# Patient Record
Sex: Male | Born: 1975
Health system: Southern US, Community
[De-identification: ages and names within clinical notes are randomized; demographics above are authoritative.]

## PROBLEM LIST (undated history)

## (undated) DIAGNOSIS — J45909 Unspecified asthma, uncomplicated: Secondary | ICD-10-CM

## (undated) DIAGNOSIS — B009 Herpesviral infection, unspecified: Secondary | ICD-10-CM

## (undated) DIAGNOSIS — T7840XA Allergy, unspecified, initial encounter: Secondary | ICD-10-CM

## (undated) DIAGNOSIS — K219 Gastro-esophageal reflux disease without esophagitis: Secondary | ICD-10-CM

## (undated) DIAGNOSIS — J40 Bronchitis, not specified as acute or chronic: Secondary | ICD-10-CM

## (undated) DIAGNOSIS — M1990 Unspecified osteoarthritis, unspecified site: Secondary | ICD-10-CM

## (undated) DIAGNOSIS — I10 Essential (primary) hypertension: Secondary | ICD-10-CM

## (undated) DIAGNOSIS — F418 Other specified anxiety disorders: Secondary | ICD-10-CM

## (undated) DIAGNOSIS — G709 Myoneural disorder, unspecified: Secondary | ICD-10-CM

## (undated) DIAGNOSIS — F419 Anxiety disorder, unspecified: Secondary | ICD-10-CM

## (undated) DIAGNOSIS — Z8739 Personal history of other diseases of the musculoskeletal system and connective tissue: Secondary | ICD-10-CM

## (undated) HISTORY — DX: Herpesviral infection, unspecified: B00.9

## (undated) HISTORY — PX: NASAL SEPTUM SURGERY: SHX37

## (undated) HISTORY — DX: Anxiety disorder, unspecified: F41.9

## (undated) HISTORY — PX: TONSILLECTOMY: SUR1361

## (undated) HISTORY — DX: Bronchitis, not specified as acute or chronic: J40

## (undated) HISTORY — PX: CORNEAL TRANSPLANT: SHX108

## (undated) HISTORY — DX: Unspecified osteoarthritis, unspecified site: M19.90

## (undated) HISTORY — DX: Other specified anxiety disorders: F41.8

## (undated) HISTORY — DX: Myoneural disorder, unspecified: G70.9

## (undated) HISTORY — DX: Allergy, unspecified, initial encounter: T78.40XA

## (undated) HISTORY — PX: OTHER SURGICAL HISTORY: SHX169

---

## 2002-01-30 ENCOUNTER — Emergency Department (HOSPITAL_COMMUNITY): Admission: EM | Admit: 2002-01-30 | Discharge: 2002-01-31 | Payer: Self-pay | Admitting: Emergency Medicine

## 2002-01-30 ENCOUNTER — Encounter: Payer: Self-pay | Admitting: Emergency Medicine

## 2002-12-11 ENCOUNTER — Emergency Department (HOSPITAL_COMMUNITY): Admission: EM | Admit: 2002-12-11 | Discharge: 2002-12-11 | Payer: Self-pay | Admitting: Emergency Medicine

## 2002-12-14 ENCOUNTER — Encounter: Payer: Self-pay | Admitting: Emergency Medicine

## 2002-12-14 ENCOUNTER — Emergency Department (HOSPITAL_COMMUNITY): Admission: EM | Admit: 2002-12-14 | Discharge: 2002-12-14 | Payer: Self-pay | Admitting: Emergency Medicine

## 2003-01-04 ENCOUNTER — Emergency Department (HOSPITAL_COMMUNITY): Admission: EM | Admit: 2003-01-04 | Discharge: 2003-01-04 | Payer: Self-pay | Admitting: Emergency Medicine

## 2003-07-04 ENCOUNTER — Emergency Department (HOSPITAL_COMMUNITY): Admission: EM | Admit: 2003-07-04 | Discharge: 2003-07-04 | Payer: Self-pay | Admitting: Emergency Medicine

## 2003-07-10 ENCOUNTER — Emergency Department (HOSPITAL_COMMUNITY): Admission: EM | Admit: 2003-07-10 | Discharge: 2003-07-10 | Payer: Self-pay | Admitting: Emergency Medicine

## 2003-11-22 ENCOUNTER — Inpatient Hospital Stay (HOSPITAL_COMMUNITY): Admission: AC | Admit: 2003-11-22 | Discharge: 2003-11-28 | Payer: Self-pay

## 2005-02-03 ENCOUNTER — Emergency Department (HOSPITAL_COMMUNITY): Admission: EM | Admit: 2005-02-03 | Discharge: 2005-02-03 | Payer: Self-pay | Admitting: *Deleted

## 2005-04-03 ENCOUNTER — Emergency Department (HOSPITAL_COMMUNITY): Admission: EM | Admit: 2005-04-03 | Discharge: 2005-04-03 | Payer: Self-pay | Admitting: Emergency Medicine

## 2005-11-17 ENCOUNTER — Emergency Department (HOSPITAL_COMMUNITY): Admission: EM | Admit: 2005-11-17 | Discharge: 2005-11-17 | Payer: Self-pay | Admitting: *Deleted

## 2006-02-16 ENCOUNTER — Emergency Department (HOSPITAL_COMMUNITY): Admission: EM | Admit: 2006-02-16 | Discharge: 2006-02-16 | Payer: Self-pay | Admitting: Emergency Medicine

## 2006-07-19 ENCOUNTER — Emergency Department (HOSPITAL_COMMUNITY): Admission: EM | Admit: 2006-07-19 | Discharge: 2006-07-19 | Payer: Self-pay | Admitting: Emergency Medicine

## 2007-04-06 ENCOUNTER — Emergency Department (HOSPITAL_COMMUNITY): Admission: EM | Admit: 2007-04-06 | Discharge: 2007-04-06 | Payer: Self-pay | Admitting: Emergency Medicine

## 2007-11-01 ENCOUNTER — Emergency Department (HOSPITAL_COMMUNITY): Admission: EM | Admit: 2007-11-01 | Discharge: 2007-11-01 | Payer: Self-pay | Admitting: Emergency Medicine

## 2008-05-03 ENCOUNTER — Encounter: Admission: RE | Admit: 2008-05-03 | Discharge: 2008-05-03 | Payer: Self-pay | Admitting: Orthopedic Surgery

## 2008-09-19 ENCOUNTER — Emergency Department (HOSPITAL_COMMUNITY): Admission: EM | Admit: 2008-09-19 | Discharge: 2008-09-19 | Payer: Self-pay | Admitting: Emergency Medicine

## 2008-09-25 ENCOUNTER — Emergency Department (HOSPITAL_COMMUNITY): Admission: EM | Admit: 2008-09-25 | Discharge: 2008-09-25 | Payer: Self-pay | Admitting: Emergency Medicine

## 2008-10-12 ENCOUNTER — Encounter: Payer: Self-pay | Admitting: Cardiology

## 2008-10-12 HISTORY — PX: DOBUTAMINE STRESS ECHO: SHX5426

## 2009-01-23 ENCOUNTER — Encounter: Admission: RE | Admit: 2009-01-23 | Discharge: 2009-01-23 | Payer: Self-pay | Admitting: Family Medicine

## 2009-04-20 ENCOUNTER — Emergency Department (HOSPITAL_COMMUNITY): Admission: EM | Admit: 2009-04-20 | Discharge: 2009-04-20 | Payer: Self-pay | Admitting: Emergency Medicine

## 2009-04-23 ENCOUNTER — Emergency Department (HOSPITAL_COMMUNITY): Admission: EM | Admit: 2009-04-23 | Discharge: 2009-04-23 | Payer: Self-pay | Admitting: Emergency Medicine

## 2009-05-07 ENCOUNTER — Emergency Department (HOSPITAL_COMMUNITY): Admission: EM | Admit: 2009-05-07 | Discharge: 2009-05-07 | Payer: Self-pay | Admitting: Emergency Medicine

## 2009-05-12 ENCOUNTER — Emergency Department (HOSPITAL_COMMUNITY): Admission: EM | Admit: 2009-05-12 | Discharge: 2009-05-12 | Payer: Self-pay | Admitting: Emergency Medicine

## 2009-06-05 ENCOUNTER — Emergency Department (HOSPITAL_COMMUNITY): Admission: EM | Admit: 2009-06-05 | Discharge: 2009-06-05 | Payer: Self-pay | Admitting: Emergency Medicine

## 2009-12-07 ENCOUNTER — Emergency Department (HOSPITAL_COMMUNITY): Admission: EM | Admit: 2009-12-07 | Discharge: 2009-12-07 | Payer: Self-pay | Admitting: Emergency Medicine

## 2010-02-09 ENCOUNTER — Emergency Department (HOSPITAL_COMMUNITY): Admission: EM | Admit: 2010-02-09 | Discharge: 2010-02-09 | Payer: Self-pay | Admitting: Emergency Medicine

## 2010-04-08 ENCOUNTER — Emergency Department (HOSPITAL_COMMUNITY): Admission: EM | Admit: 2010-04-08 | Discharge: 2010-04-08 | Payer: Self-pay | Admitting: Emergency Medicine

## 2010-09-08 ENCOUNTER — Emergency Department (HOSPITAL_COMMUNITY)
Admission: EM | Admit: 2010-09-08 | Discharge: 2010-09-08 | Disposition: A | Discharge status: Home / Self Care | Payer: BC Managed Care – PPO | Attending: Emergency Medicine | Admitting: Emergency Medicine

## 2010-09-08 ENCOUNTER — Emergency Department (HOSPITAL_COMMUNITY): Payer: BC Managed Care – PPO

## 2010-09-08 DIAGNOSIS — I10 Essential (primary) hypertension: Secondary | ICD-10-CM | POA: Insufficient documentation

## 2010-09-08 DIAGNOSIS — M79609 Pain in unspecified limb: Secondary | ICD-10-CM | POA: Insufficient documentation

## 2010-09-08 DIAGNOSIS — M722 Plantar fascial fibromatosis: Secondary | ICD-10-CM | POA: Insufficient documentation

## 2010-09-08 DIAGNOSIS — Z79899 Other long term (current) drug therapy: Secondary | ICD-10-CM | POA: Insufficient documentation

## 2010-09-12 LAB — URINE MICROSCOPIC-ADD ON

## 2010-09-12 LAB — DIFFERENTIAL
Basophils Absolute: 0 10*3/uL (ref 0.0–0.1)
Eosinophils Absolute: 0 10*3/uL (ref 0.0–0.7)
Lymphocytes Relative: 21 % (ref 12–46)
Lymphs Abs: 1.4 10*3/uL (ref 0.7–4.0)
Neutrophils Relative %: 75 % (ref 43–77)

## 2010-09-12 LAB — URINALYSIS, ROUTINE W REFLEX MICROSCOPIC
Glucose, UA: NEGATIVE mg/dL
Hgb urine dipstick: NEGATIVE
Specific Gravity, Urine: 1.024 (ref 1.005–1.030)

## 2010-09-12 LAB — COMPREHENSIVE METABOLIC PANEL
ALT: 29 U/L (ref 0–53)
CO2: 25 mEq/L (ref 19–32)
Calcium: 9.1 mg/dL (ref 8.4–10.5)
Creatinine, Ser: 0.9 mg/dL (ref 0.4–1.5)
GFR calc non Af Amer: >60 mL/min (ref >60)
Glucose, Bld: 110 mg/dL — ABNORMAL HIGH (ref 70–99)
Total Bilirubin: 0.7 mg/dL (ref 0.3–1.2)

## 2010-09-12 LAB — CBC
HCT: 43.6 % (ref 39.0–52.0)
Hemoglobin: 15.4 g/dL (ref 13.0–17.0)
MCH: 33.3 pg (ref 26.0–34.0)
MCHC: 35.4 g/dL (ref 30.0–36.0)

## 2010-09-12 LAB — URINE CULTURE

## 2010-09-12 LAB — LIPASE, BLOOD: Lipase: 28 U/L (ref 11–59)

## 2010-10-02 LAB — URINALYSIS, ROUTINE W REFLEX MICROSCOPIC
Ketones, ur: NEGATIVE mg/dL
Nitrite: NEGATIVE
Urobilinogen, UA: 1 mg/dL (ref 0.0–1.0)
pH: 8.5 — ABNORMAL HIGH (ref 5.0–8.0)

## 2010-10-09 LAB — TROPONIN I
Troponin I: <0.01 ng/mL (ref 0.00–0.06)
Troponin I: <0.01 ng/mL (ref 0.00–0.06)

## 2010-10-09 LAB — BASIC METABOLIC PANEL
BUN: 9 mg/dL (ref 6–23)
Calcium: 9.5 mg/dL (ref 8.4–10.5)
Creatinine, Ser: 0.88 mg/dL (ref 0.4–1.5)
GFR calc non Af Amer: >60 mL/min (ref >60)
Glucose, Bld: 85 mg/dL (ref 70–99)
Potassium: 3.9 mEq/L (ref 3.5–5.1)

## 2010-10-09 LAB — DIFFERENTIAL
Basophils Absolute: 0 10*3/uL (ref 0.0–0.1)
Basophils Absolute: 0.1 10*3/uL (ref 0.0–0.1)
Eosinophils Relative: 2 % (ref 0–5)
Lymphocytes Relative: 16 % (ref 12–46)
Lymphocytes Relative: 22 % (ref 12–46)
Lymphs Abs: 1.5 10*3/uL (ref 0.7–4.0)
Lymphs Abs: 1.8 10*3/uL (ref 0.7–4.0)
Monocytes Absolute: 0.5 10*3/uL (ref 0.1–1.0)
Neutro Abs: 5.7 10*3/uL (ref 1.7–7.7)
Neutro Abs: 7.3 10*3/uL (ref 1.7–7.7)
Neutrophils Relative %: 78 % — ABNORMAL HIGH (ref 43–77)

## 2010-10-09 LAB — POCT I-STAT, CHEM 8
BUN: 16 mg/dL (ref 6–23)
Calcium, Ion: 1.09 mmol/L — ABNORMAL LOW (ref 1.12–1.32)
Chloride: 106 mEq/L (ref 96–112)
Glucose, Bld: 90 mg/dL (ref 70–99)
HCT: 49 % (ref 39.0–52.0)
Potassium: 4.6 mEq/L (ref 3.5–5.1)

## 2010-10-09 LAB — CBC
HCT: 42 % (ref 39.0–52.0)
HCT: 46 % (ref 39.0–52.0)
Hemoglobin: 16.5 g/dL (ref 13.0–17.0)
Platelets: 216 10*3/uL (ref 150–400)
RDW: 12.4 % (ref 11.5–15.5)
RDW: 12.5 % (ref 11.5–15.5)
WBC: 8.3 10*3/uL (ref 4.0–10.5)
WBC: 9.4 10*3/uL (ref 4.0–10.5)

## 2010-10-09 LAB — POCT CARDIAC MARKERS
CKMB, poc: <1 ng/mL — ABNORMAL LOW (ref 1.0–8.0)
Troponin i, poc: <0.05 ng/mL (ref 0.00–0.09)

## 2010-10-09 LAB — D-DIMER, QUANTITATIVE: D-Dimer, Quant: 0.24 ug/mL-FEU (ref 0.00–0.48)
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
## 2010-12-16 ENCOUNTER — Emergency Department (HOSPITAL_COMMUNITY)
Admission: EM | Admit: 2010-12-16 | Discharge: 2010-12-16 | Disposition: A | Discharge status: Home / Self Care | Payer: BC Managed Care – PPO | Attending: Emergency Medicine | Admitting: Emergency Medicine

## 2010-12-16 DIAGNOSIS — R112 Nausea with vomiting, unspecified: Secondary | ICD-10-CM | POA: Insufficient documentation

## 2010-12-16 DIAGNOSIS — K5900 Constipation, unspecified: Secondary | ICD-10-CM | POA: Insufficient documentation

## 2010-12-16 DIAGNOSIS — R109 Unspecified abdominal pain: Secondary | ICD-10-CM | POA: Insufficient documentation

## 2010-12-16 DIAGNOSIS — I10 Essential (primary) hypertension: Secondary | ICD-10-CM | POA: Insufficient documentation

## 2010-12-16 LAB — URINE MICROSCOPIC-ADD ON

## 2010-12-16 LAB — CBC
MCHC: 35.3 g/dL (ref 30.0–36.0)
Platelets: 244 10*3/uL (ref 150–400)
RDW: 12 % (ref 11.5–15.5)
WBC: 8.2 10*3/uL (ref 4.0–10.5)

## 2010-12-16 LAB — COMPREHENSIVE METABOLIC PANEL
ALT: 32 U/L (ref 0–53)
AST: 22 U/L (ref 0–37)
Albumin: 4.1 g/dL (ref 3.5–5.2)
Alkaline Phosphatase: 78 U/L (ref 39–117)
Chloride: 104 mEq/L (ref 96–112)
Potassium: 3.8 mEq/L (ref 3.5–5.1)
Sodium: 138 mEq/L (ref 135–145)
Total Protein: 6.9 g/dL (ref 6.0–8.3)

## 2010-12-16 LAB — DIFFERENTIAL
Basophils Absolute: 0 10*3/uL (ref 0.0–0.1)
Basophils Relative: 1 % (ref 0–1)
Eosinophils Relative: 1 % (ref 0–5)
Monocytes Absolute: 0.4 10*3/uL (ref 0.1–1.0)

## 2010-12-16 LAB — URINALYSIS, ROUTINE W REFLEX MICROSCOPIC
Bilirubin Urine: NEGATIVE
Glucose, UA: NEGATIVE mg/dL
Nitrite: NEGATIVE
Specific Gravity, Urine: 1.012 (ref 1.005–1.030)
pH: 6.5 (ref 5.0–8.0)

## 2010-12-16 LAB — LIPASE, BLOOD: Lipase: 23 U/L (ref 11–59)

## 2011-04-10 ENCOUNTER — Emergency Department (HOSPITAL_COMMUNITY)
Admission: EM | Admit: 2011-04-10 | Discharge: 2011-04-10 | Disposition: A | Discharge status: Home / Self Care | Payer: BC Managed Care – PPO | Attending: Emergency Medicine | Admitting: Emergency Medicine

## 2011-04-10 DIAGNOSIS — H5789 Other specified disorders of eye and adnexa: Secondary | ICD-10-CM | POA: Insufficient documentation

## 2011-04-10 DIAGNOSIS — I10 Essential (primary) hypertension: Secondary | ICD-10-CM | POA: Insufficient documentation

## 2011-06-21 ENCOUNTER — Ambulatory Visit (INDEPENDENT_AMBULATORY_CARE_PROVIDER_SITE_OTHER): Payer: BC Managed Care – PPO

## 2011-06-21 DIAGNOSIS — F411 Generalized anxiety disorder: Secondary | ICD-10-CM

## 2011-06-21 DIAGNOSIS — I10 Essential (primary) hypertension: Secondary | ICD-10-CM

## 2011-07-27 ENCOUNTER — Emergency Department (HOSPITAL_COMMUNITY)
Admission: EM | Admit: 2011-07-27 | Discharge: 2011-07-27 | Disposition: A | Discharge status: Home / Self Care | Payer: BC Managed Care – PPO | Attending: Emergency Medicine | Admitting: Emergency Medicine

## 2011-07-27 ENCOUNTER — Encounter (HOSPITAL_COMMUNITY): Payer: Self-pay | Admitting: Emergency Medicine

## 2011-07-27 ENCOUNTER — Ambulatory Visit (HOSPITAL_COMMUNITY)
Admission: RE | Admit: 2011-07-27 | Discharge: 2011-07-27 | Disposition: A | Discharge status: Home / Self Care | Payer: BC Managed Care – PPO | Source: Ambulatory Visit | Attending: Emergency Medicine | Admitting: Emergency Medicine

## 2011-07-27 DIAGNOSIS — R609 Edema, unspecified: Secondary | ICD-10-CM | POA: Insufficient documentation

## 2011-07-27 DIAGNOSIS — M25579 Pain in unspecified ankle and joints of unspecified foot: Secondary | ICD-10-CM | POA: Insufficient documentation

## 2011-07-27 DIAGNOSIS — K219 Gastro-esophageal reflux disease without esophagitis: Secondary | ICD-10-CM | POA: Insufficient documentation

## 2011-07-27 DIAGNOSIS — M25571 Pain in right ankle and joints of right foot: Secondary | ICD-10-CM

## 2011-07-27 DIAGNOSIS — M79609 Pain in unspecified limb: Secondary | ICD-10-CM | POA: Insufficient documentation

## 2011-07-27 DIAGNOSIS — Z79899 Other long term (current) drug therapy: Secondary | ICD-10-CM | POA: Insufficient documentation

## 2011-07-27 DIAGNOSIS — M7730 Calcaneal spur, unspecified foot: Secondary | ICD-10-CM | POA: Insufficient documentation

## 2011-07-27 DIAGNOSIS — I10 Essential (primary) hypertension: Secondary | ICD-10-CM | POA: Insufficient documentation

## 2011-07-27 DIAGNOSIS — M25476 Effusion, unspecified foot: Secondary | ICD-10-CM | POA: Insufficient documentation

## 2011-07-27 DIAGNOSIS — M25473 Effusion, unspecified ankle: Secondary | ICD-10-CM | POA: Insufficient documentation

## 2011-07-27 DIAGNOSIS — R269 Unspecified abnormalities of gait and mobility: Secondary | ICD-10-CM | POA: Insufficient documentation

## 2011-07-27 HISTORY — DX: Essential (primary) hypertension: I10

## 2011-07-27 HISTORY — DX: Gastro-esophageal reflux disease without esophagitis: K21.9

## 2011-07-27 MED ORDER — INDOMETHACIN 25 MG PO CAPS: 25.0000 mg | ORAL_CAPSULE | Freq: Three times a day (TID) | ORAL | Status: AC

## 2011-07-27 MED ORDER — HYDROCODONE-ACETAMINOPHEN 5-325 MG PO TABS
1.0000 | ORAL_TABLET | Freq: Once | ORAL | Status: AC
  Administered 2011-07-27: 1 via ORAL
  Filled 2011-07-27: qty 1

## 2011-07-27 NOTE — ED Notes (Signed)
Rt foot pain all weekend, top of foot denis injury hurts to walk and on his job he has to stand to wrok

## 2011-07-27 NOTE — ED Notes (Signed)
PA at bedside.

## 2011-07-27 NOTE — ED Notes (Signed)
Pt returned from X-ray.  

## 2011-07-27 NOTE — ED Provider Notes (Signed)
History     CSN: [id number]  Arrival date & time 07/27/11  1017   First MD Initiated Contact with Patient 07/27/11 1035      Chief Complaint  Patient presents with  . Foot Pain    (Consider location/radiation/quality/duration/timing/severity/associated sxs/prior treatment) Patient is a 36 y.o. male presenting with lower extremity pain. The history is provided by the patient.  Foot Pain This is a new (right foot pain) problem. Episode onset: 2 days ago. The problem occurs constantly. The problem has been gradually worsening. Associated symptoms include joint swelling. Pertinent negatives include no chills, fever, numbness, rash or weakness. The symptoms are aggravated by walking. He has tried acetaminophen for the symptoms. The treatment provided mild relief.  NKI. No known hx gout.  Past Medical History  Diagnosis Date  . Hypertension   . Acid reflux     History reviewed. No pertinent past surgical history.  No family history on file.  History  Substance Use Topics  . Smoking status: Current Everyday Smoker  . Smokeless tobacco: Not on file  . Alcohol Use: Yes      Review of Systems  Constitutional: Negative for fever and chills.  Cardiovascular: Negative for leg swelling.  Musculoskeletal: Positive for joint swelling. Negative for back pain.       See HPI  Skin: Positive for color change. Negative for rash and wound.  Neurological: Negative for weakness and numbness.    Allergies  Amoxicillin  Home Medications   Current Outpatient Rx  Name Route Sig Dispense Refill  . ACETAMINOPHEN 500 MG PO TABS Oral Take 1,000 mg by mouth every 6 (six) hours as needed. For pain    . METOPROLOL SUCCINATE ER 100 MG PO TB24 Oral Take 100 mg by mouth daily. Take with or immediately following a meal.    . OMEPRAZOLE 20 MG PO CPDR Oral Take 20 mg by mouth daily.      BP 140/82  Pulse 59  Temp 97.4 F (36.3 C)  Resp 16  SpO2 99%  Physical Exam  Nursing note and vitals  reviewed. Constitutional: He is oriented to person, place, and time. He appears well-developed and well-nourished. No distress.  HENT:  Head: Normocephalic and atraumatic.  Right Ear: External ear normal.  Left Ear: External ear normal.  Eyes: Pupils are equal, round, and reactive to light.  Neck: Neck supple.  Cardiovascular: Normal rate and regular rhythm.   Pulmonary/Chest: Effort normal. No respiratory distress.  Abdominal: Soft. He exhibits no distension. There is no tenderness.  Musculoskeletal: Normal range of motion.       Right ankle: He exhibits swelling.       Feet:  Neurological: He is alert and oriented to person, place, and time.       Sensation intact to light touch. Antalgic gait  Skin: Skin is warm and dry.    ED Course  Procedures (including critical care time)  Labs Reviewed - No data to display Dg Ankle Complete Right  07/27/2011  *RADIOLOGY REPORT*  Clinical Data: Right ankle pain following injury.  RIGHT ANKLE - COMPLETE 3+ VIEW  Comparison: None  Findings: There is no evidence of fracture, subluxation or dislocation. The joint spaces are within normal limits. No radiopaque foreign bodies are present. Mild anterior lateral soft tissue swelling is identified. A small to moderate calcaneal spur is present. No other focal bony lesions are identified.  IMPRESSION: Lateral/anterior soft tissue swelling without acute bony abnormality.  Calcaneal spur.  Original Report Authenticated By:  JEFFREY T. HU, M.D.     Dx 1: right ankle pain   MDM  Atraumatic right ankle pain x 2 days. Erythema to overlying skin. Does not appear c/w cellulitis. Do not suspect septic joint. X-ray with no evidence of bony abnormality. Will tx with anti-inflammatories as gout is a strong possibility. Advised ortho f/u for definitive dx if symptoms do not improve or recur.        [address] [place], [place] 07/27/11 1231

## 2011-07-27 NOTE — ED Provider Notes (Signed)
Medical screening examination/treatment/procedure(s) were performed by non-physician practitioner and as supervising physician I was immediately available for consultation/collaboration. Devoria Albe, MD, Armando Gang   Ward Givens, MD 07/27/11 [phone]

## 2011-07-31 ENCOUNTER — Encounter (HOSPITAL_COMMUNITY): Payer: Self-pay | Admitting: Emergency Medicine

## 2011-07-31 ENCOUNTER — Emergency Department (HOSPITAL_COMMUNITY)
Admission: EM | Admit: 2011-07-31 | Discharge: 2011-07-31 | Disposition: A | Discharge status: Home / Self Care | Payer: BC Managed Care – PPO | Attending: Emergency Medicine | Admitting: Emergency Medicine

## 2011-07-31 DIAGNOSIS — M25571 Pain in right ankle and joints of right foot: Secondary | ICD-10-CM

## 2011-07-31 DIAGNOSIS — M25476 Effusion, unspecified foot: Secondary | ICD-10-CM | POA: Insufficient documentation

## 2011-07-31 DIAGNOSIS — Z79899 Other long term (current) drug therapy: Secondary | ICD-10-CM | POA: Insufficient documentation

## 2011-07-31 DIAGNOSIS — M25473 Effusion, unspecified ankle: Secondary | ICD-10-CM | POA: Insufficient documentation

## 2011-07-31 DIAGNOSIS — M25579 Pain in unspecified ankle and joints of unspecified foot: Secondary | ICD-10-CM | POA: Insufficient documentation

## 2011-07-31 DIAGNOSIS — M79609 Pain in unspecified limb: Secondary | ICD-10-CM | POA: Insufficient documentation

## 2011-07-31 DIAGNOSIS — M109 Gout, unspecified: Secondary | ICD-10-CM | POA: Insufficient documentation

## 2011-07-31 DIAGNOSIS — I10 Essential (primary) hypertension: Secondary | ICD-10-CM | POA: Insufficient documentation

## 2011-07-31 DIAGNOSIS — K219 Gastro-esophageal reflux disease without esophagitis: Secondary | ICD-10-CM | POA: Insufficient documentation

## 2011-07-31 LAB — CBC
HCT: 42.7 % (ref 39.0–52.0)
Hemoglobin: 15 g/dL (ref 13.0–17.0)
MCV: 93.6 fL (ref 78.0–100.0)
RBC: 4.56 MIL/uL (ref 4.22–5.81)
WBC: 8.3 10*3/uL (ref 4.0–10.5)

## 2011-07-31 LAB — DIFFERENTIAL
Eosinophils Relative: 3 % (ref 0–5)
Lymphocytes Relative: 25 % (ref 12–46)
Lymphs Abs: 2.1 10*3/uL (ref 0.7–4.0)
Monocytes Absolute: 0.6 10*3/uL (ref 0.1–1.0)

## 2011-07-31 MED ORDER — INDOMETHACIN 25 MG PO CAPS: ORAL_CAPSULE | ORAL | Status: DC

## 2011-07-31 MED ORDER — DOXYCYCLINE HYCLATE 100 MG PO CAPS: 100.0000 mg | ORAL_CAPSULE | Freq: Two times a day (BID) | ORAL | Status: AC

## 2011-07-31 MED ORDER — TRAMADOL-ACETAMINOPHEN 37.5-325 MG PO TABS: 1.0000 | ORAL_TABLET | Freq: Four times a day (QID) | ORAL | Status: AC | PRN

## 2011-07-31 MED ORDER — OXYCODONE-ACETAMINOPHEN 5-325 MG PO TABS: 1.0000 | ORAL_TABLET | Freq: Once | ORAL | Status: DC

## 2011-07-31 NOTE — ED Provider Notes (Signed)
Patient relates about a week ago he started getting swelling and redness of his right foot just under his lateral malleolus. He denies any known injury. He denies any known exposure to insects. He denies itching. He does have a family history of gout and his paternal grandfather and grandmother. He denies fever or chills. Patient was seen in the ER a few days ago and started on low-dose Indocin 25 mg 3 times a day without relief.  Patient has diffuse swelling of his right ankle and dorsum of his foot. There is a area of redness just distal to the lateral malleolus and proximal foot. Is not warm to touch it does not look like cellulitis. He has good distal pulses. He states the pain does not go above the level of his ankle. His ankle does appear to be consistent with gout.  Patient has a normal white blood cell count and a high normal uric acid level  I have discussed with PA we're going to put him on a higher dose Indocin taper and doxycycline. He also can have some pain medicine in addition to Indocin.  Medical screening examination/treatment/procedure(s) were conducted as a shared visit with non-physician practitioner(s) and myself.  I personally evaluated the patient during the encounter Devoria Albe, MD, Franz Dell, MD 07/31/11 [phone]

## 2011-07-31 NOTE — ED Notes (Signed)
Pt. reports being diagnosised with gout and that the swelling and pain has gotten worse.  Pt. reports having to use a walker now to ambulate.

## 2011-07-31 NOTE — ED Notes (Signed)
Rt foot pain states was seen here a few days ago  For same

## 2011-07-31 NOTE — ED Provider Notes (Signed)
History     CSN: [id number]  Arrival date & time 07/31/11  4098   First MD Initiated Contact with Patient 07/31/11 1035      Chief Complaint  Patient presents with  . Foot Pain    (Consider location/radiation/quality/duration/timing/severity/associated sxs/prior treatment) Patient is a 36 y.o. male presenting with lower extremity pain. The history is provided by the patient.  Foot Pain This is a new problem. The current episode started in the past 7 days. The problem occurs constantly. The problem has been gradually worsening. Associated symptoms include arthralgias and joint swelling. Pertinent negatives include no anorexia, chills, fever, nausea, numbness, rash or weakness. The symptoms are aggravated by standing and walking. He has tried NSAIDs for the symptoms.   Pt was seen here for the same several days ago. Xrays were done during that visit which were negative. He was diagnosed with probable gout and given rx for 25 mg of indomethacin TID. He has been taking this as prescribed but feels that the pain is worsening. He had no antecedent injury that he is aware of and has not had an exposure to insect bites. He has noted some swelling and erythema which was present on previous visit but is unsure if the area of erythema has spread. He denies fever, numbness, weakness in the foot. Has not noted any swelling or pain to the calf. Able to bear wt but this is painful.  No personal hx of gout but + FHx in paternal GF, GM.  Past Medical History  Diagnosis Date  . Hypertension   . Acid reflux     History reviewed. No pertinent past surgical history.  No family history on file.  History  Substance Use Topics  . Smoking status: Current Everyday Smoker  . Smokeless tobacco: Not on file  . Alcohol Use: Yes      Review of Systems  Constitutional: Negative for fever, chills and appetite change.  Gastrointestinal: Negative for nausea and anorexia.  Musculoskeletal: Positive for joint  swelling and arthralgias.  Skin: Positive for color change. Negative for rash.  Neurological: Negative for dizziness, weakness and numbness.    Allergies  Amoxicillin  Home Medications   Current Outpatient Rx  Name Route Sig Dispense Refill  . ACETAMINOPHEN 500 MG PO TABS Oral Take 1,000 mg by mouth every 6 (six) hours as needed. For pain    . IBUPROFEN 200 MG PO TABS Oral Take 400 mg by mouth every 6 (six) hours as needed. pain    . INDOMETHACIN 25 MG PO CAPS Oral Take 1 capsule (25 mg total) by mouth 3 (three) times daily with meals. 15 capsule 0  . METOPROLOL SUCCINATE ER 100 MG PO TB24 Oral Take 100 mg by mouth daily. Take with or immediately following a meal.    . OMEPRAZOLE 20 MG PO CPDR Oral Take 20 mg by mouth daily.      BP 124/56  Pulse 63  Temp(Src) 98 F (36.7 C) (Oral)  Resp 20  SpO2 97%  Physical Exam  Nursing note and vitals reviewed. Constitutional: He is oriented to person, place, and time. He appears well-developed and well-nourished. No distress.  HENT:  Head: Normocephalic and atraumatic.  Eyes: EOM are normal.  Neck: Normal range of motion.  Cardiovascular: Normal rate.   Pulmonary/Chest: Effort normal.  Musculoskeletal:       Right ankle: He exhibits swelling. He exhibits no ecchymosis, no deformity and normal pulse. tenderness. Lateral malleolus tenderness found. Achilles tendon normal.  Right foot: He exhibits tenderness.       Tender to palp over lateral malleolus and lateral dorsum of foot. Edema around ankle jt, worst on lat side. Slight erythema to lateral dorsum of foot. FROM at ankle, toes. Neurovascularly intact with sensory intact to lt touch. DP/PT pulses intact.  Neurological: He is alert and oriented to person, place, and time.  Skin: Skin is warm and dry. He is not diaphoretic.  Psychiatric: He has a normal mood and affect.    ED Course  Procedures (including critical care time)   Labs Reviewed  CBC  DIFFERENTIAL  URIC ACID     No results found.  1. Gout attack   2. Right ankle pain       MDM  Previous note and plain films reviewed. Pt presents with approx 1 week of atraumatic R ankle pain. He was treated with 25 mg of indomethacin TID but this has not relieved his pain. Edema noted to R ankle diffusely and lat dorsum of foot. He does have erythema to the dorsum, but this does not appear cellulitic in nature. Uric acid and CBC performed today were nl. Continue to suspect gout, given ankle swelling. Discussed with Dr. Lynelle Doctor, who saw the pt with me. Will put on higher dose indomethacin taper in addition to analgesics. Will also give doxycycline to cover for the possibility of cellulitis. Return precautions discussed.        Grant [place], [place] 07/31/11 1255

## 2011-07-31 NOTE — ED Provider Notes (Signed)
Medical screening examination/treatment/procedure(s) were performed by non-physician practitioner and as supervising physician I was immediately available for consultation/collaboration. Devoria Albe, MD, Armando Gang   Ward Givens, MD 07/31/11 2155

## 2011-08-04 ENCOUNTER — Ambulatory Visit (INDEPENDENT_AMBULATORY_CARE_PROVIDER_SITE_OTHER): Payer: BC Managed Care – PPO | Admitting: Internal Medicine

## 2011-08-04 DIAGNOSIS — G8929 Other chronic pain: Secondary | ICD-10-CM

## 2011-08-04 DIAGNOSIS — G2581 Restless legs syndrome: Secondary | ICD-10-CM

## 2011-08-04 DIAGNOSIS — F411 Generalized anxiety disorder: Secondary | ICD-10-CM

## 2011-08-04 DIAGNOSIS — M549 Dorsalgia, unspecified: Secondary | ICD-10-CM

## 2011-08-04 DIAGNOSIS — M109 Gout, unspecified: Secondary | ICD-10-CM

## 2011-08-04 MED ORDER — MELOXICAM 15 MG PO TABS: 15.0000 mg | ORAL_TABLET | Freq: Every day | ORAL | Status: DC

## 2011-08-04 MED ORDER — PREDNISONE 20 MG PO TABS: 20.0000 mg | ORAL_TABLET | Freq: Every day | ORAL | Status: AC

## 2011-08-04 MED ORDER — ALPRAZOLAM 0.5 MG PO TABS: 0.5000 mg | ORAL_TABLET | Freq: Three times a day (TID) | ORAL | Status: DC | PRN

## 2011-08-04 MED ORDER — CYCLOBENZAPRINE HCL 10 MG PO TABS: 10.0000 mg | ORAL_TABLET | Freq: Two times a day (BID) | ORAL | Status: AC | PRN

## 2011-08-04 NOTE — Progress Notes (Signed)
  Subjective:    Patient ID: Brandon Garza, male    DOB: 1976/01/03, 36 y.o.   MRN: HPIit is a he comes in with acute onset of foot pain that required 2 ER visits recently. The diagnosis was gout. His CBC and metabolic profile were normal he was given Indocinand tramadol and doxycycline.this did not clear his problem although he is mildly improved. He cannot work with this pain. 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  He is also here for medication refills for his generalized anxiety disorder, his back pain, and his restless leg syndrome. He is doing well in all regards and is actively exercising to try to improve things. He recognizes that he needs weight loss. He also recognizes that his diet including numerous Mountain Dew's is causing his gout. This is his first episode of gout but he has a strong family history of the same.     Review of Systems  Constitutional: Negative.   HENT: Negative.   Eyes: Negative.   Respiratory: Negative.   Cardiovascular: Negative.   Genitourinary: Negative.   Neurological: Negative.   gastrointestinal is positive for GERD which is responding well to medication  His anxiety has prevented him from achieving of life that is free from economic worry and provides him with adequate social interactions.     Objective:   Physical Exam Physical examination reveals a very tender dorsum of the right foot with pain around the lateral malleolus but no redness or heat.  The low back is tender to palpation but the straight leg raise is negative and the peripheral neuro status are normal  His psychiatric evaluation is stable      Assessment & Plan:  Problem #1 gout-he will be treated with prednisone and continue his other medications Problem #2 back painhe'll continue exercises Problem #3 generalized anxiety disorder his alprazolam was refilled and he has refills of Prozac. 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

## 2011-10-07 ENCOUNTER — Encounter: Payer: Self-pay | Admitting: *Deleted

## 2011-10-07 ENCOUNTER — Ambulatory Visit (INDEPENDENT_AMBULATORY_CARE_PROVIDER_SITE_OTHER): Payer: BC Managed Care – PPO | Admitting: Family Medicine

## 2011-10-07 ENCOUNTER — Telehealth: Payer: Self-pay

## 2011-10-07 VITALS — BP 149/90 | HR 85 | Temp 97.5°F | Resp 20 | Ht 69.0 in | Wt 220.4 lb

## 2011-10-07 DIAGNOSIS — L919 Hypertrophic disorder of the skin, unspecified: Secondary | ICD-10-CM

## 2011-10-07 DIAGNOSIS — M545 Low back pain: Secondary | ICD-10-CM

## 2011-10-07 DIAGNOSIS — L918 Other hypertrophic disorders of the skin: Secondary | ICD-10-CM

## 2011-10-07 MED ORDER — KETOROLAC TROMETHAMINE 60 MG/2ML IM SOLN
60.0000 mg | Freq: Once | INTRAMUSCULAR | Status: AC
  Administered 2011-10-07: 60 mg via INTRAMUSCULAR

## 2011-10-07 MED ORDER — OXYCODONE-ACETAMINOPHEN 2.5-325 MG PO TABS: 1.0000 | ORAL_TABLET | ORAL | Status: AC | PRN

## 2011-10-07 MED ORDER — CYCLOBENZAPRINE HCL 10 MG PO TABS: 10.0000 mg | ORAL_TABLET | Freq: Every evening | ORAL | Status: AC | PRN

## 2011-10-07 NOTE — Telephone Encounter (Signed)
Pt is requesting an out of work note for today.  Possibly longer if needed Dr. Hal Hope saw him this morning.

## 2011-10-07 NOTE — Telephone Encounter (Signed)
pts mother called, states that dr Hal Hope wrote pts percocet rx incorrectly. States written as 2.5 and should be 5-500. States the way it is written will cost more and wants to know if it can be written as it was previously.

## 2011-10-07 NOTE — Progress Notes (Signed)
   Patient ID: LAYKEN DOENGES MRN: [id number], DOB: 15-Sep-1975, 36 y.o. Date of Encounter: 10/07/2011, 12:47 PM   PROCEDURE NOTE: Verbal consent obtained. Sterile technique employed. Betadine prep per usual protocol.  Skin tag excised with 10 blade. Hemostasis obtained Dressed with bandage Wound care  Signed, Eula Listen, PA-C 10/07/2011 12:47 PM

## 2011-10-07 NOTE — Progress Notes (Signed)
  Subjective:    Patient ID: Brandon Garza, male    DOB: May 21, 1976, 36 y.o.   MRN: [id number]  Back Pain This is a new problem. The current episode started in the past 7 days. The problem occurs constantly. The pain is present in the lumbar spine. The pain radiates to the left thigh. The pain is at a severity of 9/10. The pain is worse during the day. The symptoms are aggravated by twisting, position and bending. He has tried NSAIDs for the symptoms. The treatment provided no relief.  Told in the past he may have nerve impingement.  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   SH/ Works at [employer]    Objective:   Physical Exam  Constitutional: He appears well-developed.  Neck: Neck supple.  Cardiovascular: Normal rate, regular rhythm and normal heart sounds.   Pulmonary/Chest: Effort normal and breath sounds normal.  Musculoskeletal:       Lumbar back: He exhibits tenderness, pain and spasm.       Back:  Neurological: He has normal strength.  Reflex Scores:      Patellar reflexes are 2+ on the right side and 2+ on the left side.      Negative SLS (B)  Skin: Skin is warm.      Assessment & Plan:   1. LBP (low back pain)  ketorolac (TORADOL) injection 60 mg, cyclobenzaprine (FLEXERIL) 10 MG tablet, oxycodone-acetaminophen (PERCOCET) 2.5-325 MG per tablet  2. Skin tag (L) neck  S/p removal

## 2011-10-08 NOTE — Telephone Encounter (Signed)
Patient's prescription written for the strength appropriate for condition.  Let's contact patient and see how he is doing. OK work note.  If patient still having symptoms over the weekend I would like him to call me.  Thanks!

## 2011-10-09 ENCOUNTER — Other Ambulatory Visit: Payer: Self-pay | Admitting: Family Medicine

## 2011-10-16 ENCOUNTER — Encounter: Payer: Self-pay | Admitting: *Deleted

## 2011-11-21 ENCOUNTER — Ambulatory Visit (INDEPENDENT_AMBULATORY_CARE_PROVIDER_SITE_OTHER): Payer: BC Managed Care – PPO | Admitting: Family Medicine

## 2011-11-21 VITALS — BP 130/57 | HR 60 | Temp 97.8°F | Resp 18 | Ht 70.5 in | Wt 238.0 lb

## 2011-11-21 DIAGNOSIS — F411 Generalized anxiety disorder: Secondary | ICD-10-CM

## 2011-11-21 DIAGNOSIS — I10 Essential (primary) hypertension: Secondary | ICD-10-CM

## 2011-11-21 DIAGNOSIS — K219 Gastro-esophageal reflux disease without esophagitis: Secondary | ICD-10-CM

## 2011-11-21 DIAGNOSIS — M549 Dorsalgia, unspecified: Secondary | ICD-10-CM

## 2011-11-21 DIAGNOSIS — M722 Plantar fascial fibromatosis: Secondary | ICD-10-CM

## 2011-11-21 MED ORDER — METOPROLOL SUCCINATE ER 100 MG PO TB24: ORAL_TABLET | ORAL | Status: DC

## 2011-11-21 MED ORDER — ALPRAZOLAM 0.5 MG PO TABS: 0.5000 mg | ORAL_TABLET | Freq: Three times a day (TID) | ORAL | Status: DC | PRN

## 2011-11-21 MED ORDER — FLUOXETINE HCL 40 MG PO CAPS: 40.0000 mg | ORAL_CAPSULE | Freq: Every day | ORAL | Status: DC

## 2011-11-21 MED ORDER — MELOXICAM 15 MG PO TABS: 15.0000 mg | ORAL_TABLET | Freq: Every day | ORAL | Status: DC

## 2011-11-21 MED ORDER — PANTOPRAZOLE SODIUM 40 MG PO TBEC: 40.0000 mg | DELAYED_RELEASE_TABLET | Freq: Every day | ORAL | Status: DC

## 2011-11-21 NOTE — Progress Notes (Signed)
  Subjective:    Patient ID: JAMARIE MUSSA, male    DOB: [birth year], 36 y.o.   MRN: [id number]  HPI  Patient presents for medication refills.  Depression- doing well on current regimen; denies passive or active suicidality.  HTN- No side effects.  Working on lifestyle changes.  Less salt and fried foods.           Walking daily.  GERD- Symptomatic at night if he forgets to take his medication several days a week.   (R) heel pain; known spur and plantar facientis    Review of Systems     Objective:   Physical Exam  Constitutional: He appears well-developed.  HENT:  Head: Normocephalic and atraumatic.  Neck: Neck supple.  Cardiovascular: Normal rate, regular rhythm and normal heart sounds.   Pulmonary/Chest: Effort normal and breath sounds normal.  Abdominal: Soft. Bowel sounds are normal.  Musculoskeletal:       Right foot: He exhibits tenderness (R) heel pain nsertion of plantar tendon.       Feet:  Neurological: He is alert.  Skin: Skin is warm.          Assessment & Plan:   1. HTN (hypertension)  metoprolol succinate (TOPROL-XL) 100 MG 24 hr tablet  2. GERD (gastroesophageal reflux disease)  pantoprazole (PROTONIX) 40 MG tablet  3. GAD (generalized anxiety disorder)  FLUoxetine (PROZAC) 40 MG capsule, ALPRAZolam (XANAX) 0.5 MG tablet  4. Plantar fasciitis    5. Generalized anxiety disorder  ALPRAZolam (XANAX) 0.5 MG tablet  6. Chronic back pain  meloxicam (MOBIC) 15 MG tablet   Follow up 3 months

## 2012-02-12 ENCOUNTER — Ambulatory Visit (INDEPENDENT_AMBULATORY_CARE_PROVIDER_SITE_OTHER): Payer: BC Managed Care – PPO | Admitting: Internal Medicine

## 2012-02-12 VITALS — BP 142/84 | HR 92 | Temp 98.4°F | Resp 20 | Ht 70.5 in | Wt 238.0 lb

## 2012-02-12 DIAGNOSIS — S73005A Unspecified dislocation of left hip, initial encounter: Secondary | ICD-10-CM | POA: Insufficient documentation

## 2012-02-12 DIAGNOSIS — F8081 Childhood onset fluency disorder: Secondary | ICD-10-CM

## 2012-02-12 DIAGNOSIS — G8929 Other chronic pain: Secondary | ICD-10-CM | POA: Insufficient documentation

## 2012-02-12 DIAGNOSIS — F17200 Nicotine dependence, unspecified, uncomplicated: Secondary | ICD-10-CM | POA: Insufficient documentation

## 2012-02-12 DIAGNOSIS — M549 Dorsalgia, unspecified: Secondary | ICD-10-CM

## 2012-02-12 DIAGNOSIS — I10 Essential (primary) hypertension: Secondary | ICD-10-CM | POA: Insufficient documentation

## 2012-02-12 DIAGNOSIS — F411 Generalized anxiety disorder: Secondary | ICD-10-CM

## 2012-02-12 MED ORDER — NABUMETONE 750 MG PO TABS: 750.0000 mg | ORAL_TABLET | Freq: Two times a day (BID) | ORAL | Status: DC

## 2012-02-12 MED ORDER — OXYCODONE HCL 5 MG PO CAPS: 5.0000 mg | ORAL_CAPSULE | Freq: Four times a day (QID) | ORAL | Status: AC | PRN
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
## 2012-02-19 ENCOUNTER — Other Ambulatory Visit: Payer: Self-pay | Admitting: Family Medicine

## 2012-03-18 ENCOUNTER — Inpatient Hospital Stay: Admission: RE | Admit: 2012-03-18 | Payer: BC Managed Care – PPO | Source: Ambulatory Visit

## 2012-04-13 ENCOUNTER — Ambulatory Visit (INDEPENDENT_AMBULATORY_CARE_PROVIDER_SITE_OTHER): Payer: BC Managed Care – PPO | Admitting: Family Medicine

## 2012-04-13 ENCOUNTER — Encounter: Payer: Self-pay | Admitting: Family Medicine

## 2012-04-13 VITALS — BP 136/88 | HR 75 | Temp 98.2°F | Resp 16 | Ht 71.5 in | Wt 239.2 lb

## 2012-04-13 DIAGNOSIS — I10 Essential (primary) hypertension: Secondary | ICD-10-CM

## 2012-04-13 DIAGNOSIS — F411 Generalized anxiety disorder: Secondary | ICD-10-CM

## 2012-04-13 DIAGNOSIS — M549 Dorsalgia, unspecified: Secondary | ICD-10-CM

## 2012-04-13 DIAGNOSIS — F419 Anxiety disorder, unspecified: Secondary | ICD-10-CM

## 2012-04-13 DIAGNOSIS — K219 Gastro-esophageal reflux disease without esophagitis: Secondary | ICD-10-CM

## 2012-04-13 MED ORDER — METOPROLOL SUCCINATE ER 100 MG PO TB24: ORAL_TABLET | ORAL | Status: DC

## 2012-04-13 MED ORDER — CYCLOBENZAPRINE HCL 10 MG PO TABS: 10.0000 mg | ORAL_TABLET | Freq: Every evening | ORAL | Status: DC | PRN

## 2012-04-13 MED ORDER — NABUMETONE 750 MG PO TABS: 750.0000 mg | ORAL_TABLET | Freq: Two times a day (BID) | ORAL | Status: DC

## 2012-04-13 MED ORDER — PANTOPRAZOLE SODIUM 40 MG PO TBEC: 40.0000 mg | DELAYED_RELEASE_TABLET | Freq: Every day | ORAL | Status: DC

## 2012-04-13 MED ORDER — OXYCODONE-ACETAMINOPHEN 5-325 MG PO TABS: 1.0000 | ORAL_TABLET | Freq: Three times a day (TID) | ORAL | Status: DC | PRN

## 2012-04-13 MED ORDER — ALPRAZOLAM 0.5 MG PO TABS: 0.5000 mg | ORAL_TABLET | Freq: Three times a day (TID) | ORAL | Status: DC | PRN
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
## 2012-04-13 NOTE — Patient Instructions (Addendum)
Dentist Loni Beckwith:  [phone]

## 2012-05-20 ENCOUNTER — Other Ambulatory Visit: Payer: Self-pay | Admitting: Family Medicine

## 2012-08-09 ENCOUNTER — Emergency Department (HOSPITAL_COMMUNITY)
Admission: EM | Admit: 2012-08-09 | Discharge: 2012-08-09 | Disposition: A | Discharge status: Home / Self Care | Payer: BC Managed Care – PPO | Attending: Emergency Medicine | Admitting: Emergency Medicine

## 2012-08-09 ENCOUNTER — Emergency Department (HOSPITAL_COMMUNITY): Payer: BC Managed Care – PPO

## 2012-08-09 ENCOUNTER — Encounter (HOSPITAL_COMMUNITY): Payer: Self-pay | Admitting: Emergency Medicine

## 2012-08-09 DIAGNOSIS — Z87891 Personal history of nicotine dependence: Secondary | ICD-10-CM | POA: Insufficient documentation

## 2012-08-09 DIAGNOSIS — Z8781 Personal history of (healed) traumatic fracture: Secondary | ICD-10-CM | POA: Insufficient documentation

## 2012-08-09 DIAGNOSIS — R296 Repeated falls: Secondary | ICD-10-CM | POA: Insufficient documentation

## 2012-08-09 DIAGNOSIS — Z9889 Other specified postprocedural states: Secondary | ICD-10-CM | POA: Insufficient documentation

## 2012-08-09 DIAGNOSIS — M25474 Effusion, right foot: Secondary | ICD-10-CM

## 2012-08-09 DIAGNOSIS — M109 Gout, unspecified: Secondary | ICD-10-CM | POA: Insufficient documentation

## 2012-08-09 DIAGNOSIS — Z79899 Other long term (current) drug therapy: Secondary | ICD-10-CM | POA: Insufficient documentation

## 2012-08-09 DIAGNOSIS — Z8619 Personal history of other infectious and parasitic diseases: Secondary | ICD-10-CM | POA: Insufficient documentation

## 2012-08-09 DIAGNOSIS — S79929A Unspecified injury of unspecified thigh, initial encounter: Secondary | ICD-10-CM | POA: Insufficient documentation

## 2012-08-09 DIAGNOSIS — M25552 Pain in left hip: Secondary | ICD-10-CM

## 2012-08-09 DIAGNOSIS — K219 Gastro-esophageal reflux disease without esophagitis: Secondary | ICD-10-CM | POA: Insufficient documentation

## 2012-08-09 DIAGNOSIS — Y9301 Activity, walking, marching and hiking: Secondary | ICD-10-CM | POA: Insufficient documentation

## 2012-08-09 DIAGNOSIS — S99919A Unspecified injury of unspecified ankle, initial encounter: Secondary | ICD-10-CM | POA: Insufficient documentation

## 2012-08-09 DIAGNOSIS — Y929 Unspecified place or not applicable: Secondary | ICD-10-CM | POA: Insufficient documentation

## 2012-08-09 DIAGNOSIS — S8990XA Unspecified injury of unspecified lower leg, initial encounter: Secondary | ICD-10-CM | POA: Insufficient documentation

## 2012-08-09 DIAGNOSIS — Z8709 Personal history of other diseases of the respiratory system: Secondary | ICD-10-CM | POA: Insufficient documentation

## 2012-08-09 DIAGNOSIS — Z791 Long term (current) use of non-steroidal anti-inflammatories (NSAID): Secondary | ICD-10-CM | POA: Insufficient documentation

## 2012-08-09 DIAGNOSIS — I10 Essential (primary) hypertension: Secondary | ICD-10-CM | POA: Insufficient documentation

## 2012-08-09 DIAGNOSIS — S79919A Unspecified injury of unspecified hip, initial encounter: Secondary | ICD-10-CM | POA: Insufficient documentation

## 2012-08-09 DIAGNOSIS — F341 Dysthymic disorder: Secondary | ICD-10-CM | POA: Insufficient documentation

## 2012-08-09 MED ORDER — HYDROCODONE-ACETAMINOPHEN 5-325 MG PO TABS
1.0000 | ORAL_TABLET | Freq: Once | ORAL | Status: AC
Start: 2012-08-09 — End: 2012-08-09
  Administered 2012-08-09: 1 via ORAL
  Filled 2012-08-09: qty 1

## 2012-08-09 MED ORDER — HYDROCODONE-ACETAMINOPHEN 5-325 MG PO TABS: 1.0000 | ORAL_TABLET | Freq: Four times a day (QID) | ORAL | Status: DC | PRN

## 2012-08-09 MED ORDER — NAPROXEN 500 MG PO TABS: 500.0000 mg | ORAL_TABLET | Freq: Two times a day (BID) | ORAL | Status: DC

## 2012-08-09 MED ORDER — CEPHALEXIN 500 MG PO CAPS: 500.0000 mg | ORAL_CAPSULE | Freq: Four times a day (QID) | ORAL | Status: DC

## 2012-08-09 NOTE — ED Notes (Signed)
Patient transported to X-ray 
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
## 2012-08-09 NOTE — ED Notes (Signed)
States that he believes that he has gout in his right foot and fell on Sunday hitting the right hip and aggravated a preexisting hip injury. States that he is taking Advil with no resolve of pain

## 2012-08-11 NOTE — ED Provider Notes (Signed)
Medical screening examination/treatment/procedure(s) were performed by non-physician practitioner and as supervising physician I was immediately available for consultation/collaboration.  Toy Baker, MD 08/11/12 [phone]

## 2012-08-12 ENCOUNTER — Encounter (HOSPITAL_COMMUNITY): Payer: Self-pay

## 2012-08-12 ENCOUNTER — Emergency Department (HOSPITAL_COMMUNITY)
Admission: EM | Admit: 2012-08-12 | Discharge: 2012-08-12 | Disposition: A | Discharge status: Home / Self Care | Payer: BC Managed Care – PPO | Attending: Emergency Medicine | Admitting: Emergency Medicine

## 2012-08-12 ENCOUNTER — Emergency Department (HOSPITAL_COMMUNITY): Payer: BC Managed Care – PPO

## 2012-08-12 DIAGNOSIS — Z79899 Other long term (current) drug therapy: Secondary | ICD-10-CM | POA: Insufficient documentation

## 2012-08-12 DIAGNOSIS — L02619 Cutaneous abscess of unspecified foot: Secondary | ICD-10-CM | POA: Insufficient documentation

## 2012-08-12 DIAGNOSIS — F341 Dysthymic disorder: Secondary | ICD-10-CM | POA: Insufficient documentation

## 2012-08-12 DIAGNOSIS — Z87891 Personal history of nicotine dependence: Secondary | ICD-10-CM | POA: Insufficient documentation

## 2012-08-12 DIAGNOSIS — L0390 Cellulitis, unspecified: Secondary | ICD-10-CM

## 2012-08-12 DIAGNOSIS — Z8619 Personal history of other infectious and parasitic diseases: Secondary | ICD-10-CM | POA: Insufficient documentation

## 2012-08-12 DIAGNOSIS — K219 Gastro-esophageal reflux disease without esophagitis: Secondary | ICD-10-CM | POA: Insufficient documentation

## 2012-08-12 DIAGNOSIS — Z8709 Personal history of other diseases of the respiratory system: Secondary | ICD-10-CM | POA: Insufficient documentation

## 2012-08-12 DIAGNOSIS — I10 Essential (primary) hypertension: Secondary | ICD-10-CM | POA: Insufficient documentation

## 2012-08-12 DIAGNOSIS — L03119 Cellulitis of unspecified part of limb: Secondary | ICD-10-CM | POA: Insufficient documentation

## 2012-08-12 MED ORDER — IBUPROFEN 200 MG PO TABS
600.0000 mg | ORAL_TABLET | Freq: Once | ORAL | Status: AC
  Administered 2012-08-12: 600 mg via ORAL
  Filled 2012-08-12: qty 3
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
## 2012-08-13 NOTE — ED Provider Notes (Signed)
Medical screening examination/treatment/procedure(s) were performed by non-physician practitioner and as supervising physician I was immediately available for consultation/collaboration.   Loren Racer, MD 08/13/12 [phone]

## 2012-08-30 ENCOUNTER — Emergency Department (HOSPITAL_COMMUNITY)
Admission: EM | Admit: 2012-08-30 | Discharge: 2012-08-30 | Disposition: A | Discharge status: Home / Self Care | Payer: BC Managed Care – PPO | Attending: Emergency Medicine | Admitting: Emergency Medicine

## 2012-08-30 ENCOUNTER — Encounter (HOSPITAL_COMMUNITY): Payer: Self-pay | Admitting: *Deleted

## 2012-08-30 ENCOUNTER — Emergency Department (HOSPITAL_COMMUNITY): Payer: BC Managed Care – PPO

## 2012-08-30 DIAGNOSIS — R093 Abnormal sputum: Secondary | ICD-10-CM | POA: Insufficient documentation

## 2012-08-30 DIAGNOSIS — Z8619 Personal history of other infectious and parasitic diseases: Secondary | ICD-10-CM | POA: Insufficient documentation

## 2012-08-30 DIAGNOSIS — I10 Essential (primary) hypertension: Secondary | ICD-10-CM | POA: Insufficient documentation

## 2012-08-30 DIAGNOSIS — J3489 Other specified disorders of nose and nasal sinuses: Secondary | ICD-10-CM | POA: Insufficient documentation

## 2012-08-30 DIAGNOSIS — Z8709 Personal history of other diseases of the respiratory system: Secondary | ICD-10-CM | POA: Insufficient documentation

## 2012-08-30 DIAGNOSIS — B349 Viral infection, unspecified: Secondary | ICD-10-CM

## 2012-08-30 DIAGNOSIS — B9789 Other viral agents as the cause of diseases classified elsewhere: Secondary | ICD-10-CM | POA: Insufficient documentation

## 2012-08-30 DIAGNOSIS — Z79899 Other long term (current) drug therapy: Secondary | ICD-10-CM | POA: Insufficient documentation

## 2012-08-30 DIAGNOSIS — Z87891 Personal history of nicotine dependence: Secondary | ICD-10-CM | POA: Insufficient documentation

## 2012-08-30 DIAGNOSIS — J029 Acute pharyngitis, unspecified: Secondary | ICD-10-CM | POA: Insufficient documentation

## 2012-08-30 DIAGNOSIS — F341 Dysthymic disorder: Secondary | ICD-10-CM | POA: Insufficient documentation

## 2012-08-30 DIAGNOSIS — K219 Gastro-esophageal reflux disease without esophagitis: Secondary | ICD-10-CM | POA: Insufficient documentation

## 2012-08-30 DIAGNOSIS — R51 Headache: Secondary | ICD-10-CM | POA: Insufficient documentation

## 2012-08-30 MED ORDER — HYDROCOD POLST-CHLORPHEN POLST 10-8 MG/5ML PO LQCR: 5.0000 mL | Freq: Two times a day (BID) | ORAL | Status: DC | PRN

## 2012-08-30 NOTE — ED Notes (Signed)
Pt reports nonproductive cough x3 days. Hx bronchitis. Feels this is similar. Developed HA yesterday. Has taken Tylenol Cold OTC last night.

## 2012-08-30 NOTE — ED Provider Notes (Signed)
Medical screening examination/treatment/procedure(s) were performed by non-physician practitioner and as supervising physician I was immediately available for consultation/collaboration.   Elliott L Wentz, MD 08/30/12 2255 

## 2012-08-30 NOTE — ED Provider Notes (Signed)
History  This chart was scribed for non-physician practitioner working with Flint Melter, MD by Ardeen Jourdain, ED Scribe. This patient was seen in room WTR7/WTR7 and the patient's care was started at 1519.  CSN: [id number]  Arrival date & time 08/30/12  1335   First MD Initiated Contact with Patient 08/30/12 1519      Chief Complaint  Patient presents with  . Cough  . Headache     Patient is a 37 y.o. male presenting with cough and headaches. The history is provided by the patient. No language interpreter was used.  Cough Cough characteristics:  Productive Sputum characteristics:  Clear Severity:  Mild Onset quality:  Gradual Duration:  3 days Timing:  Constant Progression:  Worsening Chronicity:  Recurrent Context: not animal exposure, not exposure to allergens and not sick contacts   Relieved by:  Nothing Worsened by:  Deep breathing Ineffective treatments:  Cough suppressants (Tylenol Cold) Associated symptoms: chills, headaches, rhinorrhea, sinus congestion and sore throat   Associated symptoms: no chest pain, no diaphoresis, no fever and no shortness of breath   Headaches:    Severity:  Mild   Onset quality:  Gradual   Duration:  1 day   Timing:  Constant   Progression:  Worsening Rhinorrhea:    Quality:  Clear   Severity:  Mild   Duration:  2 days   Timing:  Constant   Progression:  Unchanged Sore throat:    Severity:  Mild   Onset quality:  Gradual   Duration:  2 days   Timing:  Constant   Progression:  Worsening Headache Pain location:  Generalized Quality:  Dull Radiates to:  Does not radiate Onset quality:  Gradual Duration:  1 day Timing:  Constant Progression:  Worsening Chronicity:  New Context: coughing   Relieved by:  None tried Worsened by:  Nothing tried Ineffective treatments:  None tried Associated symptoms: cough and sore throat   Associated symptoms: no abdominal pain, no back pain, no diarrhea, no fever, no nausea, no neck pain  and no vomiting     Brandon Garza is a 37 y.o. male with a h/o HTN and bronchitis who presents to the Emergency Department complaining of gradually worsening productive cough tht began 2 days ago. He states his current symptoms feel similar to his previous bronchitis. He states the symptoms have interrupted his ability to sleep. He describes his HA as a "full feeling." He denies any fever, abdominal pain, nausea, emesis and diarrhea as associated symptoms.    Past Medical History  Diagnosis Date  . Hypertension   . Acid reflux   . Depression with anxiety   . 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 Bronchitis     Past Surgical History  Procedure Laterality Date  . Corneal transplant      left eye  . Tonsillectomy    . Nasal septum surgery    . Orthopedic left arm surgery      done following an accident  . Left hip surgery    . Dobutamine stress echo  10/12/2008    normal    No family history on file.  History  Substance Use Topics  . Smoking status: Former Smoker    Quit date: 07/22/2011  . Smokeless tobacco: Former Neurosurgeon    Types: Snuff, Chew    Quit date: 07/22/2011  . Alcohol Use: 3 - 6 oz/week    6-12 drink(s) per week  Comment: social      Review of Systems  Constitutional: Positive for chills. Negative for fever and diaphoresis.  HENT: Positive for sore throat and rhinorrhea. Negative for neck pain.   Respiratory: Positive for cough. Negative for shortness of breath.   Cardiovascular: Negative for chest pain.  Gastrointestinal: Negative for nausea, vomiting, abdominal pain and diarrhea.  Musculoskeletal: Negative for back pain.  Neurological: Positive for headaches.  All other systems reviewed and are negative.    Allergies  Amoxicillin  Home Medications   Current Outpatient Rx  Name  Route  Sig  Dispense  Refill  . acetaminophen (TYLENOL) 500 MG tablet   Oral   Take 1,000 mg by mouth once.         . ALPRAZolam (XANAX) 0.5 MG  tablet   Oral   Take 0.5 mg by mouth 3 (three) times daily as needed (sleep/ anxiety).          Marland Kitchen FLUoxetine (PROZAC) 40 MG capsule   Oral   Take 40 mg by mouth at bedtime.         . metoprolol succinate (TOPROL-XL) 100 MG 24 hr tablet   Oral   Take 100 mg by mouth every morning. One daily         . pantoprazole (PROTONIX) 40 MG tablet   Oral   Take 40 mg by mouth daily as needed (For hearburn or acid reflux.).           Triage Vitals: BP 144/84  Pulse 78  Temp(Src) 97.9 F (36.6 C) (Oral)  Resp 14  SpO2 98%  Physical Exam  Nursing note and vitals reviewed. Constitutional: He is oriented to person, place, and time. He appears well-developed and well-nourished. No distress.  Moderate stutter   HENT:  Head: Normocephalic and atraumatic.  Right Ear: External ear normal.  Left Ear: External ear normal.  Nose: Nose normal.  Mouth/Throat: Oropharynx is clear and moist. No oropharyngeal exudate.  TMs normal bilaterally  Eyes: Conjunctivae and EOM are normal. Pupils are equal, round, and reactive to light.  Neck: Normal range of motion. Neck supple. No tracheal deviation present.  Cardiovascular: Normal rate, regular rhythm and normal heart sounds.  Exam reveals no gallop and no friction rub.   No murmur heard. Pulmonary/Chest: Effort normal and breath sounds normal. No stridor. No respiratory distress. He has no wheezes. He has no rales.  Abdominal: Soft. He exhibits no distension.  Musculoskeletal: Normal range of motion. He exhibits no edema and no tenderness.  Lymphadenopathy:    He has no cervical adenopathy.  Neurological: He is alert and oriented to person, place, and time.  Skin: Skin is warm and dry. He is not diaphoretic.  Psychiatric: He has a normal mood and affect. His behavior is normal.    ED Course  Procedures (including critical care time)  DIAGNOSTIC STUDIES: Oxygen Saturation is 98% on room air, normal by my interpretation.    COORDINATION OF  CARE:  4:12 PM: Discussed treatment plan which includes CXR and cough medication with pt at bedside and pt agreed to plan.     Labs Reviewed - No data to display Dg Chest 2 View  08/30/2012  *RADIOLOGY REPORT*  Clinical Data: Cough.  CHEST - 2 VIEW  Comparison: Plain film chest 09/25/2008.  Findings: Lungs are clear.  Heart size is normal.  No pneumothorax or pleural effusion.  IMPRESSION: Negative chest.   Original Report Authenticated By: Holley Dexter, M.D.      1.  Viral syndrome       MDM  Patient is stable. No fever. Chest xray clear. Lungs CTA. O2 stats 97% and greater for course of ED stay. Doubt PNA or CHF. Patient given Tussionex with strict instructions that it will make him drowsy and he cannot drive. Patient instructed that more than 90% of bronchitis is viral and it will take time for this to run its course.   Discharge Medication List as of 08/30/2012  4:22 PM    START taking these medications   Details  chlorpheniramine-HYDROcodone (TUSSIONEX PENNKINETIC ER) 10-8 MG/5ML LQCR Take 5 mLs by mouth every 12 (twelve) hours as needed., Starting 08/30/2012, Until Discontinued, Print            I personally performed the services described in this documentation, which was scribed in my presence. The recorded information has been reviewed and is accurate.   Mora Bellman, PA-C 08/30/12 [phone]

## 2012-09-16 ENCOUNTER — Ambulatory Visit (INDEPENDENT_AMBULATORY_CARE_PROVIDER_SITE_OTHER): Payer: BC Managed Care – PPO | Admitting: Emergency Medicine

## 2012-09-16 ENCOUNTER — Encounter: Payer: Self-pay | Admitting: Emergency Medicine

## 2012-09-16 VITALS — BP 140/80 | HR 80 | Temp 98.0°F | Resp 16 | Ht 69.5 in | Wt 221.0 lb

## 2012-09-16 DIAGNOSIS — I10 Essential (primary) hypertension: Secondary | ICD-10-CM

## 2012-09-16 DIAGNOSIS — F8081 Childhood onset fluency disorder: Secondary | ICD-10-CM

## 2012-09-16 DIAGNOSIS — F411 Generalized anxiety disorder: Secondary | ICD-10-CM

## 2012-09-16 MED ORDER — ALPRAZOLAM 0.5 MG PO TABS: 0.5000 mg | ORAL_TABLET | Freq: Three times a day (TID) | ORAL | Status: DC | PRN

## 2012-09-16 MED ORDER — METOPROLOL SUCCINATE ER 100 MG PO TB24: 100.0000 mg | ORAL_TABLET | Freq: Every morning | ORAL | Status: DC

## 2012-09-16 MED ORDER — FLUOXETINE HCL 40 MG PO CAPS: 40.0000 mg | ORAL_CAPSULE | Freq: Every day | ORAL | Status: DC

## 2012-09-16 MED ORDER — PANTOPRAZOLE SODIUM 40 MG PO TBEC: 40.0000 mg | DELAYED_RELEASE_TABLET | Freq: Every day | ORAL | Status: DC | PRN

## 2012-09-16 NOTE — Progress Notes (Signed)
[facility] [address], [place] [address] [phone]  Date:  09/16/2012   Name:  Brandon Garza   DOB:  [birth year]   MRN:  [id number]  PCP:  Default, Provider, MD    Chief Complaint: Medication Refill   History of Present Illness:  Brandon Garza is a 37 y.o. very pleasant male patient who presents with the following:  Comes to office today with request to fill medications. Last labs 11/2011.  Tolerating medications well with no adverse effects.  Denies any new complaints or symptoms.  No peripheral edema.  Works full time as a Adult nurse at [employer].  Patient Active Problem List  Diagnosis  . Gout  . HTN (hypertension)  . Stuttering  . GAD (generalized anxiety disorder)  . Nicotine use disorder  . Chronic back pain  . Hip dislocation, left    Past Medical History  Diagnosis Date  . Hypertension   . Acid reflux   . Depression with anxiety   . 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 Bronchitis     Past Surgical History  Procedure Laterality Date  . Corneal transplant      left eye  . Tonsillectomy    . Nasal septum surgery    . Orthopedic left arm surgery      done following an accident  . Left hip surgery    . Dobutamine stress echo  10/12/2008    normal    History  Substance Use Topics  . Smoking status: Former Smoker    Quit date: 07/22/2011  . Smokeless tobacco: Former Neurosurgeon    Types: Snuff, Chew    Quit date: 07/22/2011  . Alcohol Use: 3 - 6 oz/week    6-12 drink(s) per week     Comment: social    No family history on file.  Allergies  Allergen Reactions  . Amoxicillin Hives    Medication list has been reviewed and updated.  Current Outpatient Prescriptions on File Prior to Visit  Medication Sig Dispense Refill  . acetaminophen (TYLENOL) 500 MG tablet Take 1,000 mg by mouth once.      . ALPRAZolam (XANAX) 0.5 MG tablet Take 0.5 mg by mouth 3 (three) times daily as needed (sleep/ anxiety).       Marland Kitchen  FLUoxetine (PROZAC) 40 MG capsule Take 40 mg by mouth at bedtime.      . metoprolol succinate (TOPROL-XL) 100 MG 24 hr tablet Take 100 mg by mouth every morning. One daily      . pantoprazole (PROTONIX) 40 MG tablet Take 40 mg by mouth daily as needed (For hearburn or acid reflux.).      Marland Kitchen chlorpheniramine-HYDROcodone (TUSSIONEX PENNKINETIC ER) 10-8 MG/5ML LQCR Take 5 mLs by mouth every 12 (twelve) hours as needed.  140 mL  0   No current facility-administered medications on file prior to visit.    Review of Systems:  As per HPI, otherwise negative.    Physical Examination: Filed Vitals:   09/16/12 1216  BP: 140/80  Pulse: 80  Temp: 98 F (36.7 C)  Resp: 16   Filed Vitals:   09/16/12 1216  Height: 5' 9.5" (1.765 m)  Weight: 221 lb (100.245 kg)   Body mass index is 32.18 kg/(m^2). Ideal Body Weight: Weight in (lb) to have BMI = 25: 171.4  GEN: WDWN, NAD, Non-toxic, A & O x 3 HEENT: Atraumatic, Normocephalic. Neck supple. No masses, No LAD. Ears  and Nose: No external deformity. CV: RRR, No M/G/R. No JVD. No thrill. No extra heart sounds. PULM: CTA B, no wheezes, crackles, rhonchi. No retractions. No resp. distress. No accessory muscle use. ABD: S, NT, ND, +BS. No rebound. No HSM. EXTR: No c/c/e NEURO Normal gait.  PSYCH: Normally interactive. Conversant. Not depressed or anxious appearing.  Calm demeanor.    Assessment and Plan: Hypertension Stutters Anxiety disorder Renew meds. Refer to Dr Merla Riches at 104 for future  Signed,  Phillips Odor, MD

## 2012-09-16 NOTE — Patient Instructions (Addendum)
[facility] Policy for Prescribing Controlled Substances (Revised 04/2012) 1. Prescriptions for controlled substances will be filled by ONE provider at [facility] with whom you have established and developed a plan for your care, including follow-up. 2. You are encouraged to schedule an appointment with your prescriber at our appointment center for follow-up visits whenever possible. 3. If you request a prescription for the controlled substance while at [facility] for an acute problem (with someone other than your regular prescriber), you MAY be given a ONE-TIME prescription for a 30-day supply of the controlled substance, to allow time for you to return to see your regular prescriber for additional prescriptions.    Hypertension As your heart beats, it forces blood through your arteries. This force is your blood pressure. If the pressure is too high, it is called hypertension (HTN) or high blood pressure. HTN is dangerous because you may have it and not know it. High blood pressure may mean that your heart has to work harder to pump blood. Your arteries may be narrow or stiff. The extra work puts you at risk for heart disease, stroke, and other problems.  Blood pressure consists of two numbers, a higher number over a lower, 110/72, for example. It is stated as "110 over 72." The ideal is below 120 for the top number (systolic) and under 80 for the bottom (diastolic). Write down your blood pressure today. You should pay close attention to your blood pressure if you have certain conditions such as: Heart failure. Prior heart attack. Diabetes Chronic kidney disease. Prior stroke. Multiple risk factors for heart disease. To see if you have HTN, your blood pressure should be measured while you are seated with your arm held at the level of the heart. It should be measured at least twice. A one-time elevated blood pressure reading (especially in the Emergency Department) does not mean that you need treatment. There may be  conditions in which the blood pressure is different between your right and left arms. It is important to see your caregiver soon for a recheck. Most people have essential hypertension which means that there is not a specific cause. This type of high blood pressure may be lowered by changing lifestyle factors such as: Stress. Smoking. Lack of exercise. Excessive weight. Drug/tobacco/alcohol use. Eating less salt. Most people do not have symptoms from high blood pressure until it has caused damage to the body. Effective treatment can often prevent, delay or reduce that damage. TREATMENT  When a cause has been identified, treatment for high blood pressure is directed at the cause. There are a large number of medications to treat HTN. These fall into several categories, and your caregiver will help you select the medicines that are best for you. Medications may have side effects. You should review side effects with your caregiver. If your blood pressure stays high after you have made lifestyle changes or started on medicines,  Your medication(s) may need to be changed. Other problems may need to be addressed. Be certain you understand your prescriptions, and know how and when to take your medicine. Be sure to follow up with your caregiver within the time frame advised (usually within two weeks) to have your blood pressure rechecked and to review your medications. If you are taking more than one medicine to lower your blood pressure, make sure you know how and at what times they should be taken. Taking two medicines at the same time can result in blood pressure that is too low. SEEK IMMEDIATE MEDICAL CARE  IF: You develop a severe headache, blurred or changing vision, or confusion. You have unusual weakness or numbness, or a faint feeling. You have severe chest or abdominal pain, vomiting, or breathing problems. MAKE SURE YOU:  Understand these instructions. Will watch your condition. Will get help  right away if you are not doing well or get worse. Document Released: 06/15/2005 Document Revised: 09/07/2011 Document Reviewed: 02/03/2008 [facility] Patient Information 2013 [place], [place]. Smoking Cessation Quitting smoking is important to your health and has many advantages. However, it is not always easy to quit since nicotine is a very addictive drug. Often times, people try 3 times or more before being able to quit. This document explains the best ways for you to prepare to quit smoking. Quitting takes hard work and a lot of effort, but you can do it. ADVANTAGES OF QUITTING SMOKING You will live longer, feel better, and live better. Your body will feel the impact of quitting smoking almost immediately. Within 20 minutes, blood pressure decreases. Your pulse returns to its normal level. After 8 hours, carbon monoxide levels in the blood return to normal. Your oxygen level increases. After 24 hours, the chance of having a heart attack starts to decrease. Your breath, hair, and body stop smelling like smoke. After 48 hours, damaged nerve endings begin to recover. Your sense of taste and smell improve. After 72 hours, the body is virtually free of nicotine. Your bronchial tubes relax and breathing becomes easier. After 2 to 12 weeks, lungs can hold more air. Exercise becomes easier and circulation improves. The risk of having a heart attack, stroke, cancer, or lung disease is greatly reduced. After 1 year, the risk of coronary heart disease is cut in half. After 5 years, the risk of stroke falls to the same as a nonsmoker. After 10 years, the risk of lung cancer is cut in half and the risk of other cancers decreases significantly. After 15 years, the risk of coronary heart disease drops, usually to the level of a nonsmoker. If you are pregnant, quitting smoking will improve your chances of having a healthy baby. The people you live with, especially any children, will be healthier. You will  have extra money to spend on things other than cigarettes. QUESTIONS TO THINK ABOUT BEFORE ATTEMPTING TO QUIT You may want to talk about your answers with your caregiver. Why do you want to quit? If you tried to quit in the past, what helped and what did not? What will be the most difficult situations for you after you quit? How will you plan to handle them? Who can help you through the tough times? Your family? Friends? A caregiver? What pleasures do you get from smoking? What ways can you still get pleasure if you quit? Here are some questions to ask your caregiver: How can you help me to be successful at quitting? What medicine do you think would be best for me and how should I take it? What should I do if I need more help? What is smoking withdrawal like? How can I get information on withdrawal? GET READY Set a quit date. Change your environment by getting rid of all cigarettes, ashtrays, matches, and lighters in your home, car, or work. Do not let people smoke in your home. Review your past attempts to quit. Think about what worked and what did not. GET SUPPORT AND ENCOURAGEMENT You have a better chance of being successful if you have help. You can get support in many ways. Tell your  family, friends, and co-workers that you are going to quit and need their support. Ask them not to smoke around you. Get individual, group, or telephone counseling and support. Programs are available at [employer] and health centers. Call your local health department for information about programs in your area. Spiritual beliefs and practices may help some smokers quit. Download a "quit meter" on your computer to keep track of quit statistics, such as how long you have gone without smoking, cigarettes not smoked, and money saved. Get a self-help book about quitting smoking and staying off of tobacco. LEARN NEW SKILLS AND BEHAVIORS Distract yourself from urges to smoke. Talk to someone, go for a walk,  or occupy your time with a task. Change your normal routine. Take a different route to work. Drink tea instead of coffee. Eat breakfast in a different place. Reduce your stress. Take a hot bath, exercise, or read a book. Plan something enjoyable to do every day. Reward yourself for not smoking. Explore interactive web-based programs that specialize in helping you quit. GET MEDICINE AND USE IT CORRECTLY Medicines can help you stop smoking and decrease the urge to smoke. Combining medicine with the above behavioral methods and support can greatly increase your chances of successfully quitting smoking. Nicotine replacement therapy helps deliver nicotine to your body without the negative effects and risks of smoking. Nicotine replacement therapy includes nicotine gum, lozenges, inhalers, nasal sprays, and skin patches. Some may be available over-the-counter and others require a prescription. Antidepressant medicine helps people abstain from smoking, but how this works is unknown. This medicine is available by prescription. Nicotinic receptor partial agonist medicine simulates the effect of nicotine in your brain. This medicine is available by prescription. Ask your caregiver for advice about which medicines to use and how to use them based on your health history. Your caregiver will tell you what side effects to look out for if you choose to be on a medicine or therapy. Carefully read the information on the package. Do not use any other product containing nicotine while using a nicotine replacement product.  RELAPSE OR DIFFICULT SITUATIONS Most relapses occur within the first 3 months after quitting. Do not be discouraged if you start smoking again. Remember, most people try several times before finally quitting. You may have symptoms of withdrawal because your body is used to nicotine. You may crave cigarettes, be irritable, feel very hungry, cough often, get headaches, or have difficulty concentrating. The  withdrawal symptoms are only temporary. They are strongest when you first quit, but they will go away within 10 14 days. To reduce the chances of relapse, try to: Avoid drinking alcohol. Drinking lowers your chances of successfully quitting. Reduce the amount of caffeine you consume. Once you quit smoking, the amount of caffeine in your body increases and can give you symptoms, such as a rapid heartbeat, sweating, and anxiety. Avoid smokers because they can make you want to smoke. Do not let weight gain distract you. Many smokers will gain weight when they quit, usually less than 10 pounds. Eat a healthy diet and stay active. You can always lose the weight gained after you quit. Find ways to improve your mood other than smoking. FOR MORE INFORMATION  www.smokefree.gov  Document Released: 06/09/2001 Document Revised: 12/15/2011 Document Reviewed: 09/24/2011 [facility] Patient Information 2013 [place], [place].

## 2012-10-19 ENCOUNTER — Encounter: Payer: Self-pay | Admitting: Internal Medicine

## 2012-10-19 ENCOUNTER — Ambulatory Visit (INDEPENDENT_AMBULATORY_CARE_PROVIDER_SITE_OTHER): Payer: BC Managed Care – PPO | Admitting: Internal Medicine

## 2012-10-19 VITALS — BP 128/78 | HR 61 | Temp 98.5°F | Resp 16 | Ht 70.0 in | Wt 218.8 lb

## 2012-10-19 DIAGNOSIS — I10 Essential (primary) hypertension: Secondary | ICD-10-CM

## 2012-10-19 DIAGNOSIS — F8081 Childhood onset fluency disorder: Secondary | ICD-10-CM

## 2012-10-19 DIAGNOSIS — F17200 Nicotine dependence, unspecified, uncomplicated: Secondary | ICD-10-CM

## 2012-10-19 DIAGNOSIS — F411 Generalized anxiety disorder: Secondary | ICD-10-CM

## 2012-10-19 DIAGNOSIS — K219 Gastro-esophageal reflux disease without esophagitis: Secondary | ICD-10-CM

## 2012-10-19 DIAGNOSIS — M549 Dorsalgia, unspecified: Secondary | ICD-10-CM

## 2012-10-19 DIAGNOSIS — G8929 Other chronic pain: Secondary | ICD-10-CM

## 2012-10-19 MED ORDER — CYCLOBENZAPRINE HCL 10 MG PO TABS: 10.0000 mg | ORAL_TABLET | Freq: Three times a day (TID) | ORAL | Status: DC | PRN

## 2012-10-19 MED ORDER — METOPROLOL SUCCINATE ER 100 MG PO TB24: 100.0000 mg | ORAL_TABLET | Freq: Every morning | ORAL | Status: DC

## 2012-10-19 MED ORDER — TRAMADOL HCL 50 MG PO TABS: ORAL_TABLET | ORAL | Status: DC

## 2012-10-19 MED ORDER — ALPRAZOLAM 0.5 MG PO TABS: 0.5000 mg | ORAL_TABLET | Freq: Three times a day (TID) | ORAL | Status: DC | PRN

## 2012-10-19 MED ORDER — MELOXICAM 15 MG PO TABS: 15.0000 mg | ORAL_TABLET | Freq: Every day | ORAL | Status: DC

## 2012-10-19 NOTE — Progress Notes (Signed)
Subjective:    Patient ID: Brandon Garza, male    DOB: 03/15/1976, 37 y.o.   MRN: [id number]  HPI  Patient Active Problem List  Diagnosis  . Gout  . HTN (hypertension)-no probs  . Stuttering  . GAD (generalized anxiety disorder)-Needing 2-3 doses xanax every day for anxiety despite proz 40-but feels better overall  . Nicotine use disorder-improving--no cigs/occas smokeless products  . Chronic back pain--has flares w/ radic sxt for 3-4 d every 2-4 weeks-uses mus rel/pain meds then  . Hip dislocation, left  Current outpatient prescriptions:acetaminophen (TYLENOL) 500 MG tablet, Take 1,000 mg by mouth once., Disp: , Rfl: ;  ALPRAZolam (XANAX) 0.5 MG tablet, Take 1 tablet (0.5 mg total) by mouth 3 (three) times daily as needed (sleep/ anxiety)., Disp: 90 tablet, Rfl: 5;  FLUoxetine (PROZAC) 40 MG capsule, Take 1 capsule (40 mg total) by mouth at bedtime., Disp: 30 capsule, Rfl: 5 metoprolol succinate (TOPROL-XL) 100 MG 24 hr tablet, Take 1 tablet (100 mg total) by mouth every morning. One daily, Disp: 90 tablet, Rfl: 1;  pantoprazole (PROTONIX) 40 MG tablet, Take 1 tablet (40 mg total) by mouth daily as needed (For hearburn or acid reflux.).,variuos pain meds  UPS 13years    Review of Systems  Constitutional: Negative for fever, activity change, appetite change, fatigue and unexpected weight change.  Eyes: Negative for visual disturbance.  Respiratory: Negative for shortness of breath.   Cardiovascular: Negative for chest pain, palpitations and leg swelling.  Gastrointestinal: Negative for abdominal pain.  Musculoskeletal: Negative for joint swelling and gait problem.  Neurological: Negative for headaches.  Psychiatric/Behavioral: Negative for sleep disturbance and self-injury.       Objective:   Physical Exam BP 128/78  Pulse 61  Temp(Src) 98.5 F (36.9 C) (Oral)  Resp 16  Ht 5\' 10"  (1.778 m)  Wt 218 lb 12.8 oz (99.247 kg)  BMI 31.39 kg/m2  SpO2 97% heent cl Ht reg no  mrcg Lungs-cl extr no edema slr 90 L radic pain Neuro intct persis stutter       Assessment & Plan:  HTN (hypertension) - Plan: metoprolol succinate (TOPROL-XL) 100 MG 24 hr tablet  Chronic back pain - Plan: cyclobenzaprine (FLEXERIL) 10 MG tablet, meloxicam (MOBIC) 15 MG tablet, traMADol (ULTRAM) 50 MG tablet  Stuttering  GAD (generalized anxiety disorder) - Plan: ALPRAZolam (XANAX) 0.5 MG tablet  Nicotine use disorder  GERD  Meds ordered this encounter  Medications  . ALPRAZolam (XANAX) 0.5 MG tablet    Sig: Take 1 tablet (0.5 mg total) by mouth 3 (three) times daily as needed (sleep/ anxiety).    Dispense:  90 tablet    Refill:  5  . metoprolol succinate (TOPROL-XL) 100 MG 24 hr tablet    Sig: Take 1 tablet (100 mg total) by mouth every morning. One daily    Dispense:  90 tablet    Refill:  1  . cyclobenzaprine (FLEXERIL) 10 MG tablet    Sig: Take 1 tablet (10 mg total) by mouth 3 (three) times daily as needed for muscle spasms.    Dispense:  30 tablet    Refill:  1  . meloxicam (MOBIC) 15 MG tablet    Sig: Take 1 tablet (15 mg total) by mouth daily.    Dispense:  30 tablet    Refill:  1  . traMADol (ULTRAM) 50 MG tablet    Sig: 1-2 q6h prn pain    Dispense:  30 tablet    Refill:  3  decr tobac Increase wt loss Will consider physiatry Consider incr proz F/u 22mo

## 2012-10-20 ENCOUNTER — Encounter: Payer: Self-pay | Admitting: Internal Medicine

## 2012-10-20 DIAGNOSIS — K219 Gastro-esophageal reflux disease without esophagitis: Secondary | ICD-10-CM | POA: Insufficient documentation

## 2012-11-26 ENCOUNTER — Ambulatory Visit (INDEPENDENT_AMBULATORY_CARE_PROVIDER_SITE_OTHER): Payer: BC Managed Care – PPO | Admitting: Family Medicine

## 2012-11-26 ENCOUNTER — Ambulatory Visit: Payer: BC Managed Care – PPO

## 2012-11-26 VITALS — BP 121/77 | HR 79 | Temp 98.3°F | Resp 16 | Ht 71.0 in | Wt 220.0 lb

## 2012-11-26 DIAGNOSIS — M109 Gout, unspecified: Secondary | ICD-10-CM

## 2012-11-26 DIAGNOSIS — M79642 Pain in left hand: Secondary | ICD-10-CM

## 2012-11-26 DIAGNOSIS — M25532 Pain in left wrist: Secondary | ICD-10-CM

## 2012-11-26 DIAGNOSIS — M549 Dorsalgia, unspecified: Secondary | ICD-10-CM

## 2012-11-26 DIAGNOSIS — M25539 Pain in unspecified wrist: Secondary | ICD-10-CM

## 2012-11-26 DIAGNOSIS — G8929 Other chronic pain: Secondary | ICD-10-CM

## 2012-11-26 LAB — POCT CBC
MCH, POC: 32.3 pg — AB (ref 27–31.2)
MCHC: 33.1 g/dL (ref 31.8–35.4)
MID (cbc): 0.6 (ref 0–0.9)
MPV: 9.7 fL (ref 0–99.8)
POC MID %: 7 %M (ref 0–12)
Platelet Count, POC: 244 10*3/uL (ref 142–424)
RBC: 4.86 M/uL (ref 4.69–6.13)
WBC: 9.1 10*3/uL (ref 4.6–10.2)

## 2012-11-26 LAB — RHEUMATOID FACTOR: Rhuematoid fact SerPl-aCnc: <10 IU/mL (ref <=14)

## 2012-11-26 MED ORDER — HYDROCODONE-ACETAMINOPHEN 5-325 MG PO TABS: 1.0000 | ORAL_TABLET | Freq: Four times a day (QID) | ORAL | Status: DC | PRN

## 2012-11-26 MED ORDER — PREDNISONE 20 MG PO TABS: ORAL_TABLET | ORAL | Status: DC

## 2012-11-26 MED ORDER — CYCLOBENZAPRINE HCL 10 MG PO TABS: 10.0000 mg | ORAL_TABLET | Freq: Three times a day (TID) | ORAL | Status: DC | PRN
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
## 2012-11-26 NOTE — Patient Instructions (Addendum)
1.  Call in 10-14 days if not significantly improved for orthopedic referral.

## 2013-01-11 ENCOUNTER — Emergency Department (HOSPITAL_COMMUNITY)
Admission: EM | Admit: 2013-01-11 | Discharge: 2013-01-11 | Disposition: A | Discharge status: Home / Self Care | Payer: BC Managed Care – PPO | Attending: Emergency Medicine | Admitting: Emergency Medicine

## 2013-01-11 ENCOUNTER — Encounter (HOSPITAL_COMMUNITY): Payer: Self-pay | Admitting: *Deleted

## 2013-01-11 DIAGNOSIS — M129 Arthropathy, unspecified: Secondary | ICD-10-CM | POA: Insufficient documentation

## 2013-01-11 DIAGNOSIS — M549 Dorsalgia, unspecified: Secondary | ICD-10-CM

## 2013-01-11 DIAGNOSIS — Z8669 Personal history of other diseases of the nervous system and sense organs: Secondary | ICD-10-CM | POA: Insufficient documentation

## 2013-01-11 DIAGNOSIS — M545 Low back pain, unspecified: Secondary | ICD-10-CM | POA: Insufficient documentation

## 2013-01-11 DIAGNOSIS — I10 Essential (primary) hypertension: Secondary | ICD-10-CM | POA: Insufficient documentation

## 2013-01-11 DIAGNOSIS — F329 Major depressive disorder, single episode, unspecified: Secondary | ICD-10-CM | POA: Insufficient documentation

## 2013-01-11 DIAGNOSIS — Z87891 Personal history of nicotine dependence: Secondary | ICD-10-CM | POA: Insufficient documentation

## 2013-01-11 DIAGNOSIS — Z79899 Other long term (current) drug therapy: Secondary | ICD-10-CM | POA: Insufficient documentation

## 2013-01-11 DIAGNOSIS — Z8619 Personal history of other infectious and parasitic diseases: Secondary | ICD-10-CM | POA: Insufficient documentation

## 2013-01-11 DIAGNOSIS — K219 Gastro-esophageal reflux disease without esophagitis: Secondary | ICD-10-CM | POA: Insufficient documentation

## 2013-01-11 DIAGNOSIS — G8929 Other chronic pain: Secondary | ICD-10-CM | POA: Insufficient documentation

## 2013-01-11 DIAGNOSIS — F3289 Other specified depressive episodes: Secondary | ICD-10-CM | POA: Insufficient documentation

## 2013-01-11 DIAGNOSIS — F411 Generalized anxiety disorder: Secondary | ICD-10-CM | POA: Insufficient documentation

## 2013-01-11 MED ORDER — OXYCODONE-ACETAMINOPHEN 5-325 MG PO TABS
2.0000 | ORAL_TABLET | Freq: Once | ORAL | Status: AC
Start: 2013-01-11 — End: 2013-01-11
  Administered 2013-01-11: 2 via ORAL
  Filled 2013-01-11: qty 2

## 2013-01-11 MED ORDER — NAPROXEN 500 MG PO TABS: 500.0000 mg | ORAL_TABLET | Freq: Two times a day (BID) | ORAL | Status: DC

## 2013-01-11 MED ORDER — OXYCODONE-ACETAMINOPHEN 5-325 MG PO TABS: 2.0000 | ORAL_TABLET | ORAL | Status: DC | PRN

## 2013-01-11 MED ORDER — PREDNISONE 20 MG PO TABS: ORAL_TABLET | ORAL | Status: DC

## 2013-01-11 NOTE — ED Notes (Signed)
Pt presents to ed with c/o lower back pain which radiates to left hip. Pt also reports occasional bowel incontinence d/t back pain. Pt sts pain is 10/10
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
## 2013-01-14 ENCOUNTER — Ambulatory Visit (INDEPENDENT_AMBULATORY_CARE_PROVIDER_SITE_OTHER): Payer: BC Managed Care – PPO | Admitting: Emergency Medicine

## 2013-01-14 ENCOUNTER — Other Ambulatory Visit: Payer: Self-pay | Admitting: Emergency Medicine

## 2013-01-14 VITALS — BP 132/86 | HR 60 | Temp 98.0°F | Resp 16 | Ht 70.5 in | Wt 220.0 lb

## 2013-01-14 DIAGNOSIS — M5432 Sciatica, left side: Secondary | ICD-10-CM

## 2013-01-14 DIAGNOSIS — M549 Dorsalgia, unspecified: Secondary | ICD-10-CM

## 2013-01-14 DIAGNOSIS — M5430 Sciatica, unspecified side: Secondary | ICD-10-CM

## 2013-01-14 MED ORDER — HYDROCODONE-ACETAMINOPHEN 5-325 MG PO TABS: 1.0000 | ORAL_TABLET | ORAL | Status: DC | PRN
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
## 2013-01-14 NOTE — Patient Instructions (Addendum)
Sciatica °Sciatica is pain, weakness, numbness, or tingling along the path of the sciatic nerve. The nerve starts in the lower back and runs down the back of each leg. The nerve controls the muscles in the lower leg and in the back of the knee, while also providing sensation to the back of the thigh, lower leg, and the sole of your foot. Sciatica is a symptom of another medical condition. For instance, nerve damage or certain conditions, such as a herniated disk or bone spur on the spine, pinch or put pressure on the sciatic nerve. This causes the pain, weakness, or other sensations normally associated with sciatica. Generally, sciatica only affects one side of the body. °CAUSES  °· Herniated or slipped disc. °· Degenerative disk disease. °· A pain disorder involving the narrow muscle in the buttocks (piriformis syndrome). °· Pelvic injury or fracture. °· Pregnancy. °· Tumor (rare). °SYMPTOMS  °Symptoms can vary from mild to very severe. The symptoms usually travel from the low back to the buttocks and down the back of the leg. Symptoms can include: °· Mild tingling or dull aches in the lower back, leg, or hip. °· Numbness in the back of the calf or sole of the foot. °· Burning sensations in the lower back, leg, or hip. °· Sharp pains in the lower back, leg, or hip. °· Leg weakness. °· Severe back pain inhibiting movement. °These symptoms may get worse with coughing, sneezing, laughing, or prolonged sitting or standing. Also, being overweight may worsen symptoms. °DIAGNOSIS  °Your caregiver will perform a physical exam to look for common symptoms of sciatica. He or she may ask you to do certain movements or activities that would trigger sciatic nerve pain. Other tests may be performed to find the cause of the sciatica. These may include: °· Blood tests. °· X-rays. °· Imaging tests, such as an MRI or CT scan. °TREATMENT  °Treatment is directed at the cause of the sciatic pain. Sometimes, treatment is not necessary  and the pain and discomfort goes away on its own. If treatment is needed, your caregiver may suggest: °· Over-the-counter medicines to relieve pain. °· Prescription medicines, such as anti-inflammatory medicine, muscle relaxants, or narcotics. °· Applying heat or ice to the painful area. °· Steroid injections to lessen pain, irritation, and inflammation around the nerve. °· Reducing activity during periods of pain. °· Exercising and stretching to strengthen your abdomen and improve flexibility of your spine. Your caregiver may suggest losing weight if the extra weight makes the back pain worse. °· Physical therapy. °· Surgery to eliminate what is pressing or pinching the nerve, such as a bone spur or part of a herniated disk. °HOME CARE INSTRUCTIONS  °· Only take over-the-counter or prescription medicines for pain or discomfort as directed by your caregiver. °· Apply ice to the affected area for 20 minutes, 3 4 times a day for the first 48 72 hours. Then try heat in the same way. °· Exercise, stretch, or perform your usual activities if these do not aggravate your pain. °· Attend physical therapy sessions as directed by your caregiver. °· Keep all follow-up appointments as directed by your caregiver. °· Do not wear high heels or shoes that do not provide proper support. °· Check your mattress to see if it is too soft. A firm mattress may lessen your pain and discomfort. °SEEK IMMEDIATE MEDICAL CARE IF:  °· You lose control of your bowel or bladder (incontinence). °· You have increasing weakness in the lower back,   pelvis, buttocks, or legs. °· You have redness or swelling of your back. °· You have a burning sensation when you urinate. °· You have pain that gets worse when you lie down or awakens you at night. °· Your pain is worse than you have experienced in the past. °· Your pain is lasting longer than 4 weeks. °· You are suddenly losing weight without reason. °MAKE SURE YOU: °· Understand these  instructions. °· Will watch your condition. °· Will get help right away if you are not doing well or get worse. °Document Released: 06/09/2001 Document Revised: 12/15/2011 Document Reviewed: 10/25/2011 °[employer]® Patient Information ©2014 [employer]. ° °

## 2013-01-30 ENCOUNTER — Emergency Department (HOSPITAL_COMMUNITY): Payer: BC Managed Care – PPO

## 2013-01-30 ENCOUNTER — Emergency Department (HOSPITAL_COMMUNITY)
Admission: EM | Admit: 2013-01-30 | Discharge: 2013-01-30 | Disposition: A | Discharge status: Home / Self Care | Payer: BC Managed Care – PPO | Attending: Emergency Medicine | Admitting: Emergency Medicine

## 2013-01-30 ENCOUNTER — Encounter (HOSPITAL_COMMUNITY): Payer: Self-pay | Admitting: Emergency Medicine

## 2013-01-30 DIAGNOSIS — S79919A Unspecified injury of unspecified hip, initial encounter: Secondary | ICD-10-CM | POA: Insufficient documentation

## 2013-01-30 DIAGNOSIS — Z8669 Personal history of other diseases of the nervous system and sense organs: Secondary | ICD-10-CM | POA: Insufficient documentation

## 2013-01-30 DIAGNOSIS — Z79899 Other long term (current) drug therapy: Secondary | ICD-10-CM | POA: Insufficient documentation

## 2013-01-30 DIAGNOSIS — Z9109 Other allergy status, other than to drugs and biological substances: Secondary | ICD-10-CM | POA: Insufficient documentation

## 2013-01-30 DIAGNOSIS — Z87891 Personal history of nicotine dependence: Secondary | ICD-10-CM | POA: Insufficient documentation

## 2013-01-30 DIAGNOSIS — Z23 Encounter for immunization: Secondary | ICD-10-CM | POA: Insufficient documentation

## 2013-01-30 DIAGNOSIS — Y9389 Activity, other specified: Secondary | ICD-10-CM | POA: Insufficient documentation

## 2013-01-30 DIAGNOSIS — IMO0002 Reserved for concepts with insufficient information to code with codable children: Secondary | ICD-10-CM | POA: Insufficient documentation

## 2013-01-30 DIAGNOSIS — Y92410 Unspecified street and highway as the place of occurrence of the external cause: Secondary | ICD-10-CM | POA: Insufficient documentation

## 2013-01-30 DIAGNOSIS — F411 Generalized anxiety disorder: Secondary | ICD-10-CM | POA: Insufficient documentation

## 2013-01-30 DIAGNOSIS — S79929A Unspecified injury of unspecified thigh, initial encounter: Secondary | ICD-10-CM | POA: Insufficient documentation

## 2013-01-30 DIAGNOSIS — K219 Gastro-esophageal reflux disease without esophagitis: Secondary | ICD-10-CM | POA: Insufficient documentation

## 2013-01-30 DIAGNOSIS — S50312A Abrasion of left elbow, initial encounter: Secondary | ICD-10-CM

## 2013-01-30 DIAGNOSIS — I10 Essential (primary) hypertension: Secondary | ICD-10-CM | POA: Insufficient documentation

## 2013-01-30 DIAGNOSIS — Z880 Allergy status to penicillin: Secondary | ICD-10-CM | POA: Insufficient documentation

## 2013-01-30 DIAGNOSIS — F341 Dysthymic disorder: Secondary | ICD-10-CM | POA: Insufficient documentation

## 2013-01-30 DIAGNOSIS — Z8619 Personal history of other infectious and parasitic diseases: Secondary | ICD-10-CM | POA: Insufficient documentation

## 2013-01-30 DIAGNOSIS — S298XXA Other specified injuries of thorax, initial encounter: Secondary | ICD-10-CM | POA: Insufficient documentation

## 2013-01-30 DIAGNOSIS — M129 Arthropathy, unspecified: Secondary | ICD-10-CM | POA: Insufficient documentation

## 2013-01-30 DIAGNOSIS — R0789 Other chest pain: Secondary | ICD-10-CM

## 2013-01-30 MED ORDER — HYDROCODONE-ACETAMINOPHEN 5-325 MG PO TABS: 2.0000 | ORAL_TABLET | ORAL | Status: DC | PRN

## 2013-01-30 MED ORDER — TETANUS-DIPHTH-ACELL PERTUSSIS 5-2.5-18.5 LF-MCG/0.5 IM SUSP
0.5000 mL | Freq: Once | INTRAMUSCULAR | Status: AC
  Administered 2013-01-30: 0.5 mL via INTRAMUSCULAR
  Filled 2013-01-30: qty 0.5

## 2013-01-30 MED ORDER — METHOCARBAMOL 500 MG PO TABS: 500.0000 mg | ORAL_TABLET | Freq: Two times a day (BID) | ORAL | Status: DC

## 2013-01-30 MED ORDER — HYDROCODONE-ACETAMINOPHEN 5-325 MG PO TABS
1.0000 | ORAL_TABLET | Freq: Once | ORAL | Status: AC
  Administered 2013-01-30: 1 via ORAL
  Filled 2013-01-30: qty 1

## 2013-01-30 NOTE — ED Provider Notes (Signed)
Medical screening examination/treatment/procedure(s) were performed by non-physician practitioner and as supervising physician I was immediately available for consultation/collaboration.   Saydie Gerdts E Suhana Wilner, MD 01/30/13 2042 
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
## 2013-02-01 ENCOUNTER — Emergency Department: Payer: Self-pay | Admitting: Emergency Medicine

## 2013-02-12 ENCOUNTER — Ambulatory Visit (INDEPENDENT_AMBULATORY_CARE_PROVIDER_SITE_OTHER): Payer: BC Managed Care – PPO | Admitting: Family Medicine

## 2013-02-12 ENCOUNTER — Ambulatory Visit: Payer: BC Managed Care – PPO

## 2013-02-12 VITALS — BP 136/90 | HR 80 | Temp 97.9°F | Resp 18 | Ht 70.0 in | Wt 215.8 lb

## 2013-02-12 DIAGNOSIS — S2242XS Multiple fractures of ribs, left side, sequela: Secondary | ICD-10-CM

## 2013-02-12 DIAGNOSIS — R079 Chest pain, unspecified: Secondary | ICD-10-CM

## 2013-02-12 DIAGNOSIS — J069 Acute upper respiratory infection, unspecified: Secondary | ICD-10-CM

## 2013-02-12 DIAGNOSIS — K219 Gastro-esophageal reflux disease without esophagitis: Secondary | ICD-10-CM

## 2013-02-12 LAB — POCT CBC
Granulocyte percent: 76.4 %G (ref 37–80)
Lymph, poc: 1.7 (ref 0.6–3.4)
MCHC: 32.6 g/dL (ref 31.8–35.4)
MCV: 98.3 fL — AB (ref 80–97)
MID (cbc): 0.5 (ref 0–0.9)
POC LYMPH PERCENT: 18.2 %L (ref 10–50)
Platelet Count, POC: 291 10*3/uL (ref 142–424)
RDW, POC: 13.3 %

## 2013-02-12 MED ORDER — HYDROCODONE-HOMATROPINE 5-1.5 MG/5ML PO SYRP: 5.0000 mL | ORAL_SOLUTION | Freq: Three times a day (TID) | ORAL | Status: DC | PRN

## 2013-02-12 MED ORDER — DICLOFENAC SODIUM 75 MG PO TBEC: 75.0000 mg | DELAYED_RELEASE_TABLET | Freq: Three times a day (TID) | ORAL | Status: DC

## 2013-02-12 MED ORDER — IPRATROPIUM BROMIDE 0.03 % NA SOLN: 2.0000 | Freq: Four times a day (QID) | NASAL | Status: DC
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
## 2013-02-12 NOTE — Patient Instructions (Addendum)
Hot showers or breathing in steam may help loosen the congestion.  Using a netti pot or sinus rinse is also likely to help you feel better and keep this from progressing.  Use the atrovent nasal spray as needed throughout the day.  I recommend augmenting with 12 hr sudafed (behind the counter) and generic mucinex to help you move out the congestion.  If no improvement or you are getting worse, come back as you might need a course of antibiotics but hopefully with all of the above, you can avoid it.  Upper Respiratory Infection, Adult An upper respiratory infection (URI) is also known as the common cold. It is often caused by a type of germ (virus). Colds are easily spread (contagious). You can pass it to others by kissing, coughing, sneezing, or drinking out of the same glass. Usually, you get better in 1 or 2 weeks.  HOME CARE   Only take medicine as told by your doctor.  Use a warm mist humidifier or breathe in steam from a hot shower.  Drink enough water and fluids to keep your pee (urine) clear or pale yellow.  Get plenty of rest.  Return to work when your temperature is back to normal or as told by your doctor. You may use a face mask and wash your hands to stop your cold from spreading. GET HELP RIGHT AWAY IF:   After the first few days, you feel you are getting worse.  You have questions about your medicine.  You have chills, shortness of breath, or brown or red spit (mucus).  You have yellow or brown snot (nasal discharge) or pain in the face, especially when you bend forward.  You have a fever, puffy (swollen) neck, pain when you swallow, or white spots in the back of your throat.  You have a bad headache, ear pain, sinus pain, or chest pain.  You have a high-pitched whistling sound when you breathe in and out (wheezing).  You have a lasting cough or cough up blood.  You have sore muscles or a stiff neck. MAKE SURE YOU:   Understand these instructions.  Will watch your  condition.  Will get help right away if you are not doing well or get worse. Document Released: 12/02/2007 Document Revised: 09/07/2011 Document Reviewed: 10/20/2010 [facility] Patient Information 2014 [place], [place]. Rib Fracture Your caregiver has diagnosed you as having a rib fracture (a break). This can occur by a blow to the chest, by a fall against a hard object, or by violent coughing or sneezing. There may be one or many breaks. Rib fractures may heal on their own within 3 to 8 weeks. The longer healing period is usually associated with a continued cough or other aggravating activities. HOME CARE INSTRUCTIONS   Avoid strenuous activity. Be careful during activities and avoid bumping the injured rib. Activities that cause pain pull on the fracture site(s) and are best avoided if possible.  Eat a normal, well-balanced diet. Drink plenty of fluids to avoid constipation.  Take deep breaths several times a day to keep lungs free of infection. Try to cough several times a day, splinting the injured area with a pillow. This will help prevent pneumonia.  Do not wear a rib belt or binder. These restrict breathing which can lead to pneumonia.  Only take over-the-counter or prescription medicines for pain, discomfort, or fever as directed by your caregiver. SEEK MEDICAL CARE IF:  You develop a continual cough, associated with thick or bloody sputum. SEEK  IMMEDIATE MEDICAL CARE IF:   You have a fever.  You have difficulty breathing.  You have nausea (feeling sick to your stomach), vomiting, or abdominal (belly) pain.  You have worsening pain, not controlled with medications. Document Released: 06/15/2005 Document Revised: 09/07/2011 Document Reviewed: 11/17/2006 [facility] Patient Information 2014 [place], [place].

## 2013-03-22 ENCOUNTER — Emergency Department (HOSPITAL_COMMUNITY)
Admission: EM | Admit: 2013-03-22 | Discharge: 2013-03-22 | Disposition: A | Discharge status: Home / Self Care | Payer: BC Managed Care – PPO | Attending: Emergency Medicine | Admitting: Emergency Medicine

## 2013-03-22 ENCOUNTER — Encounter (HOSPITAL_COMMUNITY): Payer: Self-pay | Admitting: Emergency Medicine

## 2013-03-22 ENCOUNTER — Emergency Department (HOSPITAL_COMMUNITY): Payer: BC Managed Care – PPO

## 2013-03-22 DIAGNOSIS — Z8669 Personal history of other diseases of the nervous system and sense organs: Secondary | ICD-10-CM | POA: Insufficient documentation

## 2013-03-22 DIAGNOSIS — Z79899 Other long term (current) drug therapy: Secondary | ICD-10-CM | POA: Insufficient documentation

## 2013-03-22 DIAGNOSIS — Z791 Long term (current) use of non-steroidal anti-inflammatories (NSAID): Secondary | ICD-10-CM | POA: Insufficient documentation

## 2013-03-22 DIAGNOSIS — R5381 Other malaise: Secondary | ICD-10-CM | POA: Insufficient documentation

## 2013-03-22 DIAGNOSIS — Z87891 Personal history of nicotine dependence: Secondary | ICD-10-CM | POA: Insufficient documentation

## 2013-03-22 DIAGNOSIS — I10 Essential (primary) hypertension: Secondary | ICD-10-CM | POA: Insufficient documentation

## 2013-03-22 DIAGNOSIS — F341 Dysthymic disorder: Secondary | ICD-10-CM | POA: Insufficient documentation

## 2013-03-22 DIAGNOSIS — J40 Bronchitis, not specified as acute or chronic: Secondary | ICD-10-CM | POA: Insufficient documentation

## 2013-03-22 DIAGNOSIS — K219 Gastro-esophageal reflux disease without esophagitis: Secondary | ICD-10-CM | POA: Insufficient documentation

## 2013-03-22 DIAGNOSIS — Z8619 Personal history of other infectious and parasitic diseases: Secondary | ICD-10-CM | POA: Insufficient documentation

## 2013-03-22 DIAGNOSIS — M129 Arthropathy, unspecified: Secondary | ICD-10-CM | POA: Insufficient documentation

## 2013-03-22 MED ORDER — SODIUM CHLORIDE 0.9 % IN NEBU
INHALATION_SOLUTION | RESPIRATORY_TRACT | Status: AC
  Filled 2013-03-22: qty 3

## 2013-03-22 MED ORDER — IBUPROFEN 800 MG PO TABS
800.0000 mg | ORAL_TABLET | Freq: Once | ORAL | Status: AC
  Administered 2013-03-22: 800 mg via ORAL
  Filled 2013-03-22: qty 1

## 2013-03-22 MED ORDER — ALBUTEROL SULFATE HFA 108 (90 BASE) MCG/ACT IN AERS: 2.0000 | INHALATION_SPRAY | RESPIRATORY_TRACT | Status: DC | PRN

## 2013-03-22 MED ORDER — DEXTROMETHORPHAN-GUAIFENESIN 10-100 MG/5ML PO LIQD: 5.0000 mL | ORAL | Status: DC | PRN

## 2013-03-22 MED ORDER — ALBUTEROL SULFATE (5 MG/ML) 0.5% IN NEBU
5.0000 mg | INHALATION_SOLUTION | Freq: Once | RESPIRATORY_TRACT | Status: AC
  Administered 2013-03-22: 5 mg via RESPIRATORY_TRACT
  Filled 2013-03-22: qty 1

## 2013-03-22 NOTE — ED Notes (Signed)
Pt states about two days ago, symptoms started - nasal congestion, "feeling bad". Pt states they've progressively gotten worse. States hes been coughing today, denies coughing anything up. Pt denies N/V/D. Pt states mild SOB "comes and goes". States hes had history of bronchitis.

## 2013-03-22 NOTE — ED Notes (Signed)
Pt states his lungs feel a little congested. Pt in NAD.
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
## 2013-03-23 NOTE — ED Provider Notes (Signed)
Medical screening examination/treatment/procedure(s) were performed by non-physician practitioner and as supervising physician I was immediately available for consultation/collaboration. Rasul Decola, MD, FACEP   Makisha Marrin L Eagle Pitta, MD 03/23/13 1650 

## 2013-05-01 ENCOUNTER — Ambulatory Visit (INDEPENDENT_AMBULATORY_CARE_PROVIDER_SITE_OTHER): Payer: BC Managed Care – PPO | Admitting: Internal Medicine

## 2013-05-01 VITALS — BP 135/70 | HR 71 | Temp 97.7°F | Resp 16 | Ht 71.25 in | Wt 211.0 lb

## 2013-05-01 DIAGNOSIS — F17200 Nicotine dependence, unspecified, uncomplicated: Secondary | ICD-10-CM

## 2013-05-01 DIAGNOSIS — I10 Essential (primary) hypertension: Secondary | ICD-10-CM

## 2013-05-01 DIAGNOSIS — F411 Generalized anxiety disorder: Secondary | ICD-10-CM

## 2013-05-01 DIAGNOSIS — G8929 Other chronic pain: Secondary | ICD-10-CM

## 2013-05-01 DIAGNOSIS — F8081 Childhood onset fluency disorder: Secondary | ICD-10-CM

## 2013-05-01 DIAGNOSIS — K219 Gastro-esophageal reflux disease without esophagitis: Secondary | ICD-10-CM

## 2013-05-01 MED ORDER — ALPRAZOLAM 0.5 MG PO TABS: 0.5000 mg | ORAL_TABLET | Freq: Three times a day (TID) | ORAL | Status: DC | PRN

## 2013-05-01 MED ORDER — METOPROLOL SUCCINATE ER 100 MG PO TB24: 100.0000 mg | ORAL_TABLET | Freq: Every morning | ORAL | Status: DC

## 2013-05-01 MED ORDER — FLUOXETINE HCL 40 MG PO CAPS: 40.0000 mg | ORAL_CAPSULE | Freq: Every day | ORAL | Status: DC
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
## 2013-06-28 ENCOUNTER — Encounter (HOSPITAL_COMMUNITY): Payer: Self-pay | Admitting: Emergency Medicine

## 2013-06-28 ENCOUNTER — Emergency Department (HOSPITAL_COMMUNITY)
Admission: EM | Admit: 2013-06-28 | Discharge: 2013-06-28 | Disposition: A | Discharge status: Home / Self Care | Payer: BC Managed Care – PPO | Attending: Emergency Medicine | Admitting: Emergency Medicine

## 2013-06-28 ENCOUNTER — Emergency Department (HOSPITAL_COMMUNITY): Payer: BC Managed Care – PPO

## 2013-06-28 DIAGNOSIS — Z791 Long term (current) use of non-steroidal anti-inflammatories (NSAID): Secondary | ICD-10-CM | POA: Insufficient documentation

## 2013-06-28 DIAGNOSIS — R0602 Shortness of breath: Secondary | ICD-10-CM | POA: Insufficient documentation

## 2013-06-28 DIAGNOSIS — Z9089 Acquired absence of other organs: Secondary | ICD-10-CM | POA: Insufficient documentation

## 2013-06-28 DIAGNOSIS — Z8619 Personal history of other infectious and parasitic diseases: Secondary | ICD-10-CM | POA: Insufficient documentation

## 2013-06-28 DIAGNOSIS — M129 Arthropathy, unspecified: Secondary | ICD-10-CM | POA: Insufficient documentation

## 2013-06-28 DIAGNOSIS — F411 Generalized anxiety disorder: Secondary | ICD-10-CM | POA: Insufficient documentation

## 2013-06-28 DIAGNOSIS — F3289 Other specified depressive episodes: Secondary | ICD-10-CM | POA: Insufficient documentation

## 2013-06-28 DIAGNOSIS — K219 Gastro-esophageal reflux disease without esophagitis: Secondary | ICD-10-CM | POA: Insufficient documentation

## 2013-06-28 DIAGNOSIS — J069 Acute upper respiratory infection, unspecified: Secondary | ICD-10-CM | POA: Diagnosis present

## 2013-06-28 DIAGNOSIS — Z87891 Personal history of nicotine dependence: Secondary | ICD-10-CM | POA: Insufficient documentation

## 2013-06-28 DIAGNOSIS — I10 Essential (primary) hypertension: Secondary | ICD-10-CM | POA: Insufficient documentation

## 2013-06-28 DIAGNOSIS — F329 Major depressive disorder, single episode, unspecified: Secondary | ICD-10-CM | POA: Insufficient documentation

## 2013-06-28 DIAGNOSIS — Z8669 Personal history of other diseases of the nervous system and sense organs: Secondary | ICD-10-CM | POA: Insufficient documentation

## 2013-06-28 DIAGNOSIS — IMO0001 Reserved for inherently not codable concepts without codable children: Secondary | ICD-10-CM | POA: Insufficient documentation

## 2013-06-28 DIAGNOSIS — Z79899 Other long term (current) drug therapy: Secondary | ICD-10-CM | POA: Insufficient documentation

## 2013-06-28 DIAGNOSIS — R1110 Vomiting, unspecified: Secondary | ICD-10-CM | POA: Insufficient documentation

## 2013-06-28 MED ORDER — ALBUTEROL SULFATE HFA 108 (90 BASE) MCG/ACT IN AERS
4.0000 | INHALATION_SPRAY | Freq: Once | RESPIRATORY_TRACT | Status: AC
  Administered 2013-06-28: 4 via RESPIRATORY_TRACT
  Filled 2013-06-28: qty 6.7

## 2013-06-28 NOTE — ED Notes (Signed)
Pt c/o chest congestion and cough x 3 days.  Pt sts "I was seen at the [facility] yesterday and she thought I had PNA and an ear infection."  Pt denies ear pain and discharge.

## 2013-06-28 NOTE — ED Notes (Signed)
Bed: WA06 Expected date:  Expected time:  Means of arrival:  Comments: 
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
## 2013-07-11 ENCOUNTER — Ambulatory Visit (INDEPENDENT_AMBULATORY_CARE_PROVIDER_SITE_OTHER): Payer: BC Managed Care – PPO | Admitting: Family Medicine

## 2013-07-11 ENCOUNTER — Other Ambulatory Visit: Payer: Self-pay | Admitting: Family Medicine

## 2013-07-11 VITALS — BP 140/84 | HR 79 | Temp 98.1°F | Resp 18 | Wt 215.0 lb

## 2013-07-11 DIAGNOSIS — R0602 Shortness of breath: Secondary | ICD-10-CM

## 2013-07-11 DIAGNOSIS — F341 Dysthymic disorder: Secondary | ICD-10-CM

## 2013-07-11 DIAGNOSIS — F418 Other specified anxiety disorders: Secondary | ICD-10-CM

## 2013-07-11 MED ORDER — BUSPIRONE HCL 7.5 MG PO TABS: 7.5000 mg | ORAL_TABLET | Freq: Two times a day (BID) | ORAL | Status: DC

## 2013-07-11 NOTE — Progress Notes (Signed)
Subjective:    Patient ID: Brandon Garza, male    DOB: 02/17/1976, 38 y.o.   MRN: [id number]  HPI Brandon Garza is here for shortness of breath and anxiety.  1. Shortness of breath: He reports feeling short of breath with mild activity for the last week.  He states he has had bronchitis twice in the last 2-3 months, just got over the last bout 10 days ago.  He had been using albuteral 1 puff 4 times a day through last Friday when it ran out.  He states this did help some with the shortness of breath.  Denies cough or fever.  No rhinorrhea or nasal congestion.  No orthopnea, PND or lower extremity edema.  No chest pain currently; did have some chest tightness with the bronchitis 2 weeks ago.  He is a former smoker (quit 3 years ago).  Works at [employer] and is exposed to a lot of dust.  Did have an episode of loose stool yesterday, but this has resolved.  2. Anxiety: States that his depression and anxiety have been worse for the last 2-3 months.  Reports increased worry and anxiety.  Lots of trouble sleeping, both falling asleep and staying asleep, mostly due to difficulty with turning off his thoughts.  Also reports feelings of doom and lack of motivation.  Denies depressed mood, change in appetite or concentration.  No SI/HI.  He is taking prozac 40mg  daily.  Uses xanax 2-3 times a day.  The xanax will help him fall asleep, but he will still wake up a few hours later.  Current Outpatient Prescriptions on File Prior to Visit  Medication Sig Dispense Refill  . ALPRAZolam (XANAX) 0.5 MG tablet Take 1 tablet (0.5 mg total) by mouth 3 (three) times daily as needed (sleep/ anxiety).  90 tablet  5  . diclofenac (VOLTAREN) 75 MG EC tablet Take 1 tablet (75 mg total) by mouth 3 (three) times daily.  30 tablet  0  . FLUoxetine (PROZAC) 40 MG capsule Take 1 capsule (40 mg total) by mouth at bedtime.  30 capsule  5  . metoprolol succinate (TOPROL-XL) 100 MG 24 hr tablet Take 1 tablet (100 mg total) by mouth  every morning. One daily  90 tablet  1  . pantoprazole (PROTONIX) 40 MG tablet Take 1 tablet (40 mg total) by mouth daily as needed (For hearburn or acid reflux.).  30 tablet  5   No current facility-administered medications on file prior to visit.    PMHx: depression/anxiety; HTN  SHx: former smoker   Review of Systems See HPI    Objective:   Physical Exam BP 140/84  Pulse 79  Temp(Src) 98.1 F (36.7 C) (Oral)  Resp 18  Wt 215 lb (97.523 kg)  SpO2 97% Gen: alert, cooperative, NAD, stutter present HEENT: AT/[place], sclera white, MMM, no pharyngeal erythema or exudate Neck: supple, no LAD CV: RRR, no murmurs Pulm: CTAB, no wheezes or rales Ext: no edema, 2+ DP pulses bilaterally      Assessment & Plan:  38 yo M with recent bronchitis presenting with dyspnea on exertion and worsening of his anxiety.  # Shortness of breath: Likely mild deconditioning from recent infection.  No signs of acute URI or bronchitis.  No signs of heart failure. - stop albuterol - discussed that this will gradually improve over the next few weeks - return precautions reviewed - f/u in 2 weeks  # Anxiety/depression: Symptoms are more concerning for inadequate control of  anxiety. - no SI/HI - continue prozac 40mg  daily and xanax 0.5mg  TID prn - will start Buspar 7.5mg  BID - f/u in 2 weeks for recheck

## 2013-07-11 NOTE — Patient Instructions (Signed)
Your shortness of breath is your lungs recovering from the bronchitis.  You do not have bronchitis right now. You do not need to use the albuterol anymore. Gradually increase your activity as you are able. Your shortness of breath will gradually improve over the next few weeks.  We are going to start a medicine called Buspar to help with the anxiety and sleep.  You take 1 pill twice a day.  If you develop a cough or fevers, please come back for additional evaluation. Please follow up in 2 weeks so we can recheck you.

## 2013-07-11 NOTE — Addendum Note (Signed)
Addended by: Felix AhmadiFRANSEN, Zaviyar Rahal A on: 07/11/2013 12:29 PM   Modules accepted: Level of Service

## 2013-07-13 ENCOUNTER — Other Ambulatory Visit: Payer: Self-pay

## 2013-07-13 ENCOUNTER — Encounter: Payer: Self-pay | Admitting: Family Medicine

## 2013-07-13 MED ORDER — DICLOFENAC SODIUM 75 MG PO TBEC: 75.0000 mg | DELAYED_RELEASE_TABLET | Freq: Two times a day (BID) | ORAL | Status: DC

## 2013-07-13 NOTE — Progress Notes (Signed)
History and physical examinations reviewed with Dr Piedad ClimesHonig; agree with A/P.  Recommend close follow-up in upcoming 2-4 weeks.

## 2013-07-13 NOTE — Telephone Encounter (Signed)
Pharm reqs RF of diclofenac which was orig Rxd for acute issue by Dr Clelia CroftShaw in 01/2013. Dr Katrinka BlazingSmith, do you want to give pt RFs? I have pended, but haven't changed # or RFs.

## 2013-12-05 ENCOUNTER — Emergency Department (HOSPITAL_COMMUNITY): Payer: BC Managed Care – PPO

## 2013-12-05 ENCOUNTER — Emergency Department (HOSPITAL_COMMUNITY)
Admission: EM | Admit: 2013-12-05 | Discharge: 2013-12-05 | Disposition: A | Discharge status: Home / Self Care | Payer: BC Managed Care – PPO | Attending: Emergency Medicine | Admitting: Emergency Medicine

## 2013-12-05 ENCOUNTER — Encounter (HOSPITAL_COMMUNITY): Payer: Self-pay | Admitting: Emergency Medicine

## 2013-12-05 DIAGNOSIS — Z8739 Personal history of other diseases of the musculoskeletal system and connective tissue: Secondary | ICD-10-CM | POA: Insufficient documentation

## 2013-12-05 DIAGNOSIS — I10 Essential (primary) hypertension: Secondary | ICD-10-CM | POA: Insufficient documentation

## 2013-12-05 DIAGNOSIS — J069 Acute upper respiratory infection, unspecified: Secondary | ICD-10-CM | POA: Insufficient documentation

## 2013-12-05 DIAGNOSIS — Z8619 Personal history of other infectious and parasitic diseases: Secondary | ICD-10-CM | POA: Insufficient documentation

## 2013-12-05 DIAGNOSIS — Z87891 Personal history of nicotine dependence: Secondary | ICD-10-CM | POA: Insufficient documentation

## 2013-12-05 DIAGNOSIS — Z880 Allergy status to penicillin: Secondary | ICD-10-CM | POA: Insufficient documentation

## 2013-12-05 DIAGNOSIS — F341 Dysthymic disorder: Secondary | ICD-10-CM | POA: Insufficient documentation

## 2013-12-05 DIAGNOSIS — Z79899 Other long term (current) drug therapy: Secondary | ICD-10-CM | POA: Insufficient documentation

## 2013-12-05 MED ORDER — SALINE SPRAY 0.65 % NA SOLN: 1.0000 | NASAL | Status: DC | PRN
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
## 2013-12-05 NOTE — ED Notes (Addendum)
Pt reports nasal and chest congestion for a few days with productive cough. Pt lung sounds clear. Small bite to right shoulder noted with minimal redness/swelling surrounding tissue.   MD Horton at bedside.

## 2013-12-05 NOTE — ED Notes (Signed)
Pt c/o nasal/chest congestion and cough x 2 days.  Pt has not taken anything OTC.  Pt, also, c/o insect bite on R shoulder x 2 days.  Denies pain.

## 2013-12-05 NOTE — Discharge Instructions (Signed)

## 2013-12-23 ENCOUNTER — Ambulatory Visit (INDEPENDENT_AMBULATORY_CARE_PROVIDER_SITE_OTHER): Payer: BC Managed Care – PPO | Admitting: Internal Medicine

## 2013-12-23 ENCOUNTER — Ambulatory Visit (INDEPENDENT_AMBULATORY_CARE_PROVIDER_SITE_OTHER): Payer: BC Managed Care – PPO

## 2013-12-23 VITALS — BP 138/80 | HR 61 | Temp 98.0°F | Resp 16 | Ht 70.0 in | Wt 222.6 lb

## 2013-12-23 DIAGNOSIS — R0789 Other chest pain: Secondary | ICD-10-CM

## 2013-12-23 DIAGNOSIS — R071 Chest pain on breathing: Secondary | ICD-10-CM

## 2013-12-23 DIAGNOSIS — I10 Essential (primary) hypertension: Secondary | ICD-10-CM

## 2013-12-23 DIAGNOSIS — F411 Generalized anxiety disorder: Secondary | ICD-10-CM

## 2013-12-23 DIAGNOSIS — F17200 Nicotine dependence, unspecified, uncomplicated: Secondary | ICD-10-CM

## 2013-12-23 MED ORDER — ALPRAZOLAM 0.5 MG PO TABS: 0.5000 mg | ORAL_TABLET | Freq: Three times a day (TID) | ORAL | Status: DC | PRN

## 2013-12-23 MED ORDER — METOPROLOL SUCCINATE ER 100 MG PO TB24: 100.0000 mg | ORAL_TABLET | Freq: Every day | ORAL | Status: DC

## 2013-12-23 MED ORDER — TRAMADOL HCL 50 MG PO TABS: 50.0000 mg | ORAL_TABLET | Freq: Every evening | ORAL | Status: DC | PRN

## 2013-12-23 MED ORDER — TRAZODONE HCL 50 MG PO TABS: 25.0000 mg | ORAL_TABLET | Freq: Every evening | ORAL | Status: DC | PRN

## 2013-12-23 NOTE — Patient Instructions (Signed)
Nadine CountsBob Milan-counselor [address]  Yoga for back at a place like the [facility] on [address]

## 2013-12-23 NOTE — Progress Notes (Signed)
Subjective:    Patient ID: Brandon Garza, male    DOB: 02/28/1976, 38 y.o.   MRN: [id number]  HPI here for followup of chronic problems and also has an acute problem with chest pain along the right lateral ribs for the last 5 days. There has been no specific injury although he fractured ribs in this area one year ago. He had an ER visit for a coughing illness about 3 weeks ago. The cough has resolved. There is no current shortness of breath. The rib pain interferes with sleep but not work.  Generalized anxiety disorder-long history with a family history He is getting with his treatment with alprazolam and has been resistant to using other medicines. He had bad experiences in the past--Paxil/prozac no help with bad side effects Lives alone but does not describe a great deal of social anxiety/may share apartment with cousin Mom anxious--his relationship with his parents is fair 1 sis OK seemingly He would like to sleep better as he has a great deal of trouble falling asleep once he begins thinking anxious thoughts or if he wakes up early he can often not fall back asleep. He is now eager to improve his anxiety even more since he's had results with this regimen.  Patient Active Problem List   Diagnosis Date Noted  . HTN (hypertension) 02/12/2012    Priority: Medium-continues to be stable   . Chronic back pain--- 02/12/2012    Priority: Medium This continues to be an intermittent problem that isn't new since. There is no radicular symptoms to suggest nerve involvement. He is continued to gain weight because of the very poor diet and lack of exercise.   -    . GERD (gastroesophageal reflux disease)-stable  10/20/2012  . Stuttering--- no changes  02/12/2012  . GAD (generalized anxiety disorder) 02/12/2012  . Nicotine use disorder--- quit smoking 2 months ago  02/12/2012  . Hip dislocation, left--- they  02/12/2012  . Gout--- no recent episodes  08/04/2011     Review of Systems    Constitutional: Negative for activity change, appetite change, fatigue and unexpected weight change.  Respiratory: Negative for chest tightness and shortness of breath.   Cardiovascular: Negative for palpitations and leg swelling.  Gastrointestinal: Negative for abdominal pain.  Genitourinary: Negative for difficulty urinating.  Neurological: Negative for headaches.  Psychiatric/Behavioral: Positive for sleep disturbance. Negative for confusion, self-injury and dysphoric mood.       Objective:   Physical Exam BP 138/80  Pulse 61  Temp(Src) 98 F (36.7 C) (Oral)  Resp 16  Ht 5\' 10"  (1.778 m)  Wt 222 lb 9.6 oz (100.971 kg)  BMI 31.94 kg/m2  SpO2 97% Wt Readings from Last 3 Encounters:  12/23/13 222 lb 9.6 oz (100.971 kg)  07/11/13 215 lb (97.523 kg)  05/01/13 211 lb (95.709 kg)   HEENT including no thyromegaly or lymphadenopathy is clear Heart is regular without murmur Chest clear to auscultation He is very tender along the lower medial left ribs in the midaxillary line without defect or redness/there is point tenderness at the lower rib margin but no defect Abdomen is benign   [facility] reading (PRIMARY) by  Dr. Merla Richesoolittle= no evidence for rib fracture and no underlying lung pathology      Assessment & Plan:  Chest wall pain - Plan: Tramadol at bedtime/heat/daily stretching  Essential hypertension-refill Toprol  GAD (generalized anxiety disorder) with significant sleep disruption   Continue alprazolam/add trazodone/refer to Dr Grant FontanaMilan for counseling  Nicotine use  disorder--resolved!  Weight gain--we discussed dietary changes, the need for exercise, and the need for core conditioning if his chronic back pain is going to  improve  Chronic recurrent back pain  Discussed yoga as a good possible intervention  Meds ordered this encounter  Medications  . traZODone (DESYREL) 50 MG tablet    Sig: Take 0.5-1 tablets (25-50 mg total) by mouth at bedtime as needed for sleep.     Dispense:  30 tablet    Refill:  3  . metoprolol succinate (TOPROL-XL) 100 MG 24 hr tablet    Sig: Take 1 tablet (100 mg total) by mouth daily. Take with or immediately following a meal.    Dispense:  90 tablet    Refill:  3  . ALPRAZolam (XANAX) 0.5 MG tablet    Sig: Take 1 tablet (0.5 mg total) by mouth 3 (three) times daily as needed for anxiety.    Dispense:  90 tablet    Refill:  5  . traMADol (ULTRAM) 50 MG tablet    Sig: Take 1-2 tablets (50-100 mg total) by mouth at bedtime as needed.    Dispense:  30 tablet    Refill:  0   3mos

## 2014-01-12 ENCOUNTER — Ambulatory Visit (INDEPENDENT_AMBULATORY_CARE_PROVIDER_SITE_OTHER): Payer: BC Managed Care – PPO | Admitting: Internal Medicine

## 2014-01-12 ENCOUNTER — Encounter: Payer: Self-pay | Admitting: Radiology

## 2014-01-12 VITALS — BP 145/72 | HR 58 | Temp 97.8°F | Resp 16

## 2014-01-12 DIAGNOSIS — M109 Gout, unspecified: Secondary | ICD-10-CM

## 2014-01-12 DIAGNOSIS — M25571 Pain in right ankle and joints of right foot: Secondary | ICD-10-CM

## 2014-01-12 DIAGNOSIS — M25476 Effusion, unspecified foot: Secondary | ICD-10-CM

## 2014-01-12 DIAGNOSIS — M25579 Pain in unspecified ankle and joints of unspecified foot: Secondary | ICD-10-CM

## 2014-01-12 DIAGNOSIS — M25474 Effusion, right foot: Secondary | ICD-10-CM

## 2014-01-12 DIAGNOSIS — M25473 Effusion, unspecified ankle: Secondary | ICD-10-CM

## 2014-01-12 DIAGNOSIS — M10071 Idiopathic gout, right ankle and foot: Secondary | ICD-10-CM

## 2014-01-12 LAB — COMPREHENSIVE METABOLIC PANEL
ALBUMIN: 4.2 g/dL (ref 3.5–5.2)
ALT: 16 U/L (ref 0–53)
AST: 13 U/L (ref 0–37)
Alkaline Phosphatase: 92 U/L (ref 39–117)
BUN: 8 mg/dL (ref 6–23)
CALCIUM: 9.3 mg/dL (ref 8.4–10.5)
CHLORIDE: 104 meq/L (ref 96–112)
CO2: 27 mEq/L (ref 19–32)
CREATININE: 0.93 mg/dL (ref 0.50–1.35)
GLUCOSE: 85 mg/dL (ref 70–99)
POTASSIUM: 4.1 meq/L (ref 3.5–5.3)
Sodium: 140 mEq/L (ref 135–145)
Total Bilirubin: 0.8 mg/dL (ref 0.2–1.2)
Total Protein: 6.7 g/dL (ref 6.0–8.3)

## 2014-01-12 LAB — CBC
HEMATOCRIT: 40.3 % (ref 39.0–52.0)
HEMOGLOBIN: 14.5 g/dL (ref 13.0–17.0)
MCH: 32.5 pg (ref 26.0–34.0)
MCHC: 36 g/dL (ref 30.0–36.0)
MCV: 90.4 fL (ref 78.0–100.0)
Platelets: 192 10*3/uL (ref 150–400)
RBC: 4.46 MIL/uL (ref 4.22–5.81)
RDW: 12.6 % (ref 11.5–15.5)
WBC: 7.1 10*3/uL (ref 4.0–10.5)

## 2014-01-12 LAB — URIC ACID: URIC ACID, SERUM: 8.6 mg/dL — AB (ref 4.0–7.8)

## 2014-01-12 MED ORDER — INDOMETHACIN 50 MG PO CAPS: 50.0000 mg | ORAL_CAPSULE | Freq: Three times a day (TID) | ORAL | Status: DC

## 2014-01-12 MED ORDER — HYDROCODONE-ACETAMINOPHEN 7.5-325 MG PO TABS: 1.0000 | ORAL_TABLET | Freq: Three times a day (TID) | ORAL | Status: DC | PRN

## 2014-01-12 MED ORDER — TRAMADOL HCL 50 MG PO TABS: 50.0000 mg | ORAL_TABLET | Freq: Four times a day (QID) | ORAL | Status: DC | PRN

## 2014-01-12 NOTE — Progress Notes (Signed)
   Subjective:    Patient ID: Brandon Garza, male    DOB: 01/20/1976, 38 y.o.   MRN: [id number]  HPI  38 year old caucasian male presents to clinic today with symptoms of gout in R/foot/ankle. Pt states  pain started Monday 01/08/2014 and has progressively has gotten worse to the point of 10/10 with any touch or movement.  Pt states unable to bear weight - he presents in wheelchair. Pt states R/foot/ankle feels warm to touch.   Pt has history of having gout flare-ups with last flare-up approximately 6  Months ago.   Pt does not take medicine on a regular basis for the gout, only when symptoms present.  Pt has only taken ASA and Tylenol for pain during this onset.   Reviewed previous xrs and labs , uric acid elevated 8.4  Review of Systems     Objective:   Physical Exam  Constitutional: He is oriented to person, place, and time. He appears well-developed and well-nourished. He appears distressed.  HENT:  Head: Normocephalic.  Eyes: EOM are normal. No scleral icterus.  Neck: Normal range of motion.  Pulmonary/Chest: Effort normal.  Musculoskeletal:       Right foot: He exhibits decreased range of motion, tenderness, bony tenderness and swelling. He exhibits no deformity.       Feet:  Neurological: He is alert and oriented to person, place, and time. He has normal strength. No cranial nerve deficit or sensory deficit. He exhibits normal muscle tone. Coordination and gait abnormal.  NMSV intact  Skin: There is erythema.  Warm, red foot and ankle  Psychiatric: He has a normal mood and affect. His behavior is normal. Thought content normal.          Assessment & Plan:  Refer to Dr. Neva SeatGreene for gout evaluation and CPE  Indocin 50mg  tid Tramadol 50 mg qid prn RTC if not well in 2-3 days

## 2014-01-12 NOTE — Patient Instructions (Signed)

## 2014-01-17 ENCOUNTER — Telehealth: Payer: Self-pay

## 2014-01-17 NOTE — Telephone Encounter (Signed)
Pt came into the office requesting a note to be off of work today as well (see OV note). Pt said he tried to go to work today, but does a lot of walking and his foot was still painful. Gave him a note for today, but told him (per Dr. Ernestene MentionGuest's note) that he'll have to be seen if he can't go back tomorrow.

## 2014-03-24 ENCOUNTER — Ambulatory Visit (INDEPENDENT_AMBULATORY_CARE_PROVIDER_SITE_OTHER): Payer: BC Managed Care – PPO | Admitting: Internal Medicine

## 2014-03-24 VITALS — BP 112/60 | HR 83 | Temp 98.7°F | Resp 12 | Ht 70.0 in | Wt 233.0 lb

## 2014-03-24 DIAGNOSIS — M25474 Effusion, right foot: Secondary | ICD-10-CM

## 2014-03-24 DIAGNOSIS — M25571 Pain in right ankle and joints of right foot: Secondary | ICD-10-CM

## 2014-03-24 DIAGNOSIS — M25473 Effusion, unspecified ankle: Secondary | ICD-10-CM

## 2014-03-24 DIAGNOSIS — M25476 Effusion, unspecified foot: Secondary | ICD-10-CM

## 2014-03-24 DIAGNOSIS — M10071 Idiopathic gout, right ankle and foot: Secondary | ICD-10-CM

## 2014-03-24 DIAGNOSIS — M25579 Pain in unspecified ankle and joints of unspecified foot: Secondary | ICD-10-CM

## 2014-03-24 DIAGNOSIS — M109 Gout, unspecified: Secondary | ICD-10-CM

## 2014-03-24 MED ORDER — PREDNISONE 20 MG PO TABS: ORAL_TABLET | ORAL | Status: DC

## 2014-03-24 MED ORDER — HYDROCODONE-ACETAMINOPHEN 7.5-325 MG PO TABS: 1.0000 | ORAL_TABLET | Freq: Four times a day (QID) | ORAL | Status: DC | PRN

## 2014-03-24 MED ORDER — INDOMETHACIN 50 MG PO CAPS: 50.0000 mg | ORAL_CAPSULE | Freq: Three times a day (TID) | ORAL | Status: DC

## 2014-03-24 NOTE — Progress Notes (Signed)
Subjective:  This chart was scribed for Brandon Sia, MD by Brandon Garza, Medical scribe. This patient was seen in ROOM 8 and the patient's care was started 3:16 PM.   Patient ID: Brandon Garza, male    DOB: 11-20-1975, 38 y.o.   MRN: [id number]  HPI HPI Comments: Brandon Garza is a 38 y.o. male who presents to [facility] complaining of pain with associated swelling in his right ankle due to gout that started 2 days ago. Patient reports drinking soda that may have exacerbated the gout in the right ankle. He also reports having trouble sleeping because of the pain. He has a history of gout and has been treated with Indocin in the past. He has no history of stomach ulcers.    Patient Active Problem List   Diagnosis Date Noted  . Viral URI with cough 06/28/2013  . GERD (gastroesophageal reflux disease) 10/20/2012  . HTN (hypertension) 02/12/2012  . Stuttering 02/12/2012  . GAD (generalized anxiety disorder) 02/12/2012  . Nicotine use disorder 02/12/2012  . Chronic back pain 02/12/2012  . Hip dislocation, left 02/12/2012  . Gout 08/04/2011   Past Medical History  Diagnosis Date  . Hypertension   . Acid reflux   . Depression with anxiety   . 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 Bronchitis   . Allergy   . Arthritis   . Neuromuscular disorder   . Anxiety    Past Surgical History  Procedure Laterality Date  . Corneal transplant      left eye  . Tonsillectomy    . Nasal septum surgery    . Orthopedic left arm surgery      done following an accident  . Left hip surgery    . Dobutamine stress echo  10/12/2008    normal   Prior to Admission medications   Medication Sig Start Date End Date Taking? Authorizing Provider  ALPRAZolam Prudy Feeler) 0.5 MG tablet Take 1 tablet (0.5 mg total) by mouth 3 (three) times daily as needed for anxiety. 12/23/13  Yes Tonye Pearson, MD  metoprolol succinate (TOPROL-XL) 100 MG 24 hr tablet Take 1 tablet (100 mg total) by mouth  daily. Take with or immediately following a meal. 12/23/13  Yes Tonye Pearson, MD  traZODone (DESYREL) 50 MG tablet Take 0.5-1 tablets (25-50 mg total) by mouth at bedtime as needed for sleep. 12/23/13  Yes Tonye Pearson, MD  HYDROcodone-acetaminophen (NORCO) 7.5-325 MG per tablet Take 1 tablet by mouth every 6 (six) hours as needed for moderate pain. 03/24/14   Tonye Pearson, MD  indomethacin (INDOCIN) 50 MG capsule Take 1 capsule (50 mg total) by mouth 3 (three) times daily with meals. 03/24/14   Tonye Pearson, MD   Family History  Problem Relation Age of Onset  . Heart Problems Father   . Stroke Maternal Grandfather   . Heart Problems Maternal Grandfather   . Mental illness Paternal Grandmother   . Heart Problems Paternal Grandfather    History   Social History  . Marital Status: Single    Spouse Name: N/A    Number of Children: N/A  . Years of Education: N/A   Occupational History  . Not on file.   Social History Main Topics  . Smoking status: Former Smoker    Quit date: 07/22/2011  . Smokeless tobacco: Former Neurosurgeon    Types: Snuff, Chew    Quit date: 07/22/2011  . Alcohol Use:  3.0 - 6.0 oz/week    6-12 drink(s) per week     Comment: social  . Drug Use: No  . Sexual Activity: Yes    Birth Control/ Protection: None   Other Topics Concern  . Not on file   Social History Narrative  . No narrative on file   Allergies  Allergen Reactions  . Amoxicillin Hives    Review of Systems  Musculoskeletal: Positive for arthralgias, gait problem and joint swelling.       Objective:   Physical Exam  Nursing note and vitals reviewed. Constitutional: He is oriented to person, place, and time. He appears well-developed and well-nourished. No distress.  HENT:  Head: Normocephalic and atraumatic.  Eyes: Conjunctivae and EOM are normal. Pupils are equal, round, and reactive to light.  Neck: Neck supple.  Cardiovascular: Normal rate.   Pulmonary/Chest: Effort  normal.  Musculoskeletal:  Tender over the lateral aspect of the ankle, no erythema.   Neurological: He is alert and oriented to person, place, and time. No cranial nerve deficit.  Psychiatric: He has a normal mood and affect. His behavior is normal.     BP 112/60  Pulse 83  Temp(Src) 98.7 F (37.1 C) (Oral)  Resp 12  Ht  (1.778 m)  Wt 233 lb (105.688 kg)  BMI 33.43 kg/m2  SpO2 96%       Assessment & Plan:  I personally performed the services described in this documentation, which was scribed in my presence. The recorded information has been reviewed and is accurate.  Acute idiopathic gout of right ankle - Plan: indomethacin (INDOCIN) 50 MG capsule, HYDROcodone-acetaminophen (NORCO) 7.5-325 MG per tablet  Pain in joint, ankle and foot, right - Plan: indomethacin (INDOCIN) 50 MG capsule, HYDROcodone-acetaminophen (NORCO) 7.5-325 MG per tablet  Swelling of foot joint, right - Plan: indomethacin (INDOCIN) 50 MG capsule, HYDROcodone-acetaminophen (NORCO) 7.5-325 MG per tablet  Meds ordered this encounter  Medications  . indomethacin (INDOCIN) 50 MG capsule    Sig: Take 1 capsule (50 mg total) by mouth 3 (three) times daily with meals.    Dispense:  30 capsule    Refill:  1  . HYDROcodone-acetaminophen (NORCO) 7.5-325 MG per tablet    Sig: Take 1 tablet by mouth every 6 (six) hours as needed for moderate pain.    Dispense:  20 tablet    Refill:  0  . predniSONE (DELTASONE) 20 MG tablet    Sig: Take 3 tabs once  today    Dispense:  3 tablet    Refill:  0

## 2014-03-24 NOTE — Patient Instructions (Signed)
Take indocin every 8 hrs til improving, then evry 12 hrs til painfree for 3 days Avoid corn syrup products

## 2014-07-06 ENCOUNTER — Ambulatory Visit (INDEPENDENT_AMBULATORY_CARE_PROVIDER_SITE_OTHER): Payer: BLUE CROSS/BLUE SHIELD | Admitting: Family Medicine

## 2014-07-06 VITALS — BP 126/78 | HR 81 | Temp 98.6°F | Resp 17 | Ht 70.0 in | Wt 222.0 lb

## 2014-07-06 DIAGNOSIS — M10071 Idiopathic gout, right ankle and foot: Secondary | ICD-10-CM

## 2014-07-06 DIAGNOSIS — F411 Generalized anxiety disorder: Secondary | ICD-10-CM

## 2014-07-06 DIAGNOSIS — G479 Sleep disorder, unspecified: Secondary | ICD-10-CM

## 2014-07-06 DIAGNOSIS — I10 Essential (primary) hypertension: Secondary | ICD-10-CM

## 2014-07-06 DIAGNOSIS — F8081 Childhood onset fluency disorder: Secondary | ICD-10-CM

## 2014-07-06 MED ORDER — TRAZODONE HCL 50 MG PO TABS: 25.0000 mg | ORAL_TABLET | Freq: Every evening | ORAL | Status: DC | PRN

## 2014-07-06 MED ORDER — METOPROLOL SUCCINATE ER 100 MG PO TB24: 100.0000 mg | ORAL_TABLET | Freq: Every day | ORAL | Status: DC

## 2014-07-06 MED ORDER — ALLOPURINOL 100 MG PO TABS: 100.0000 mg | ORAL_TABLET | Freq: Every day | ORAL | Status: DC

## 2014-07-06 MED ORDER — ALPRAZOLAM 0.5 MG PO TABS: 0.5000 mg | ORAL_TABLET | Freq: Three times a day (TID) | ORAL | Status: DC | PRN

## 2014-07-06 NOTE — Progress Notes (Signed)
Subjective: Patient has been doing fairly well. He has his same problem with chronic stuttering. He takes his blood pressure medicine. He has had frequent flares of gout. No other major complaints. He has a beer drinker. He finds that drinking the beer helps his stuttering, but he doesn't want to drink so much she becomes an alcoholic. He takes the Xanax also for that purpose.  Objective: Stutters. Throat clear. Neck supple without nodes or thyromegaly. Chest clear. Heart regular without murmurs. Tender lateral right ankle.  Assessment: Anxiety disorder Stuttering Gout Hypertension  Plan: Place him on allopurinol 100 mg one daily to see if we can get that uric acid level down. Need to return in about 4 months to recheck levels of uric acid and blood chemistries. Continue other medications in the meanwhile.

## 2014-07-06 NOTE — Patient Instructions (Signed)
Take the allopurinol 1 daily for lowering the uric acid to prevent gout  Continue using the Xanax if needed for the stuttering and anxiety  Take the blood pressure medication daily  Take trazodone 1-2 before bedtime if needed for sleep  Return in 3-4 months for us to recheck labs on the gout medication. Return sooner if problems.

## 2014-08-28 ENCOUNTER — Ambulatory Visit (INDEPENDENT_AMBULATORY_CARE_PROVIDER_SITE_OTHER): Payer: BLUE CROSS/BLUE SHIELD | Admitting: Family Medicine

## 2014-08-28 VITALS — BP 122/74 | HR 60 | Temp 99.0°F | Resp 18 | Ht 70.25 in | Wt 220.0 lb

## 2014-08-28 DIAGNOSIS — R0789 Other chest pain: Secondary | ICD-10-CM | POA: Diagnosis not present

## 2014-08-28 DIAGNOSIS — F1010 Alcohol abuse, uncomplicated: Secondary | ICD-10-CM

## 2014-08-28 DIAGNOSIS — M25571 Pain in right ankle and joints of right foot: Secondary | ICD-10-CM

## 2014-08-28 DIAGNOSIS — M10071 Idiopathic gout, right ankle and foot: Secondary | ICD-10-CM

## 2014-08-28 DIAGNOSIS — R61 Generalized hyperhidrosis: Secondary | ICD-10-CM

## 2014-08-28 DIAGNOSIS — J209 Acute bronchitis, unspecified: Secondary | ICD-10-CM

## 2014-08-28 DIAGNOSIS — R05 Cough: Secondary | ICD-10-CM

## 2014-08-28 DIAGNOSIS — F419 Anxiety disorder, unspecified: Secondary | ICD-10-CM

## 2014-08-28 DIAGNOSIS — G479 Sleep disorder, unspecified: Secondary | ICD-10-CM

## 2014-08-28 DIAGNOSIS — R059 Cough, unspecified: Secondary | ICD-10-CM

## 2014-08-28 DIAGNOSIS — M25474 Effusion, right foot: Secondary | ICD-10-CM

## 2014-08-28 LAB — POCT CBC
Granulocyte percent: 56.8 %G (ref 37–80)
HEMATOCRIT: 52.8 % (ref 43.5–53.7)
Hemoglobin: 17.6 g/dL (ref 14.1–18.1)
LYMPH, POC: 4 — AB (ref 0.6–3.4)
MCH: 31.6 pg — AB (ref 27–31.2)
MCHC: 33.3 g/dL (ref 31.8–35.4)
MCV: 95.1 fL (ref 80–97)
MID (cbc): 0.4 (ref 0–0.9)
MPV: 7.9 fL (ref 0–99.8)
POC Granulocyte: 5.7 (ref 2–6.9)
POC LYMPH PERCENT: 39.4 %L (ref 10–50)
POC MID %: 3.8 %M (ref 0–12)
Platelet Count, POC: 356 10*3/uL (ref 142–424)
RBC: 5.56 M/uL (ref 4.69–6.13)
RDW, POC: 13.2 %
WBC: 10.1 10*3/uL (ref 4.6–10.2)

## 2014-08-28 MED ORDER — TRAZODONE HCL 50 MG PO TABS: 100.0000 mg | ORAL_TABLET | Freq: Every evening | ORAL | Status: DC | PRN

## 2014-08-28 MED ORDER — BENZONATATE 200 MG PO CAPS: 200.0000 mg | ORAL_CAPSULE | Freq: Three times a day (TID) | ORAL | Status: DC | PRN

## 2014-08-28 MED ORDER — ALBUTEROL SULFATE (2.5 MG/3ML) 0.083% IN NEBU
2.5000 mg | INHALATION_SOLUTION | Freq: Once | RESPIRATORY_TRACT | Status: AC
  Administered 2014-08-28: 2.5 mg via RESPIRATORY_TRACT

## 2014-08-28 MED ORDER — TRAZODONE HCL 100 MG PO TABS
100.0000 mg | ORAL_TABLET | Freq: Every evening | ORAL | Status: DC | PRN
Start: 2014-08-28 — End: 2014-12-07

## 2014-08-28 MED ORDER — HYDROXYZINE HCL 25 MG PO TABS: 25.0000 mg | ORAL_TABLET | Freq: Four times a day (QID) | ORAL | Status: DC | PRN

## 2014-08-28 MED ORDER — ALBUTEROL SULFATE HFA 108 (90 BASE) MCG/ACT IN AERS: 2.0000 | INHALATION_SPRAY | RESPIRATORY_TRACT | Status: DC | PRN

## 2014-08-28 MED ORDER — GUAIFENESIN-DM 100-10 MG/5ML PO SYRP: 5.0000 mL | ORAL_SOLUTION | ORAL | Status: DC | PRN

## 2014-08-28 MED ORDER — INDOMETHACIN 50 MG PO CAPS: 50.0000 mg | ORAL_CAPSULE | Freq: Three times a day (TID) | ORAL | Status: DC | PRN

## 2014-08-28 MED ORDER — IPRATROPIUM BROMIDE 0.02 % IN SOLN
0.5000 mg | Freq: Once | RESPIRATORY_TRACT | Status: AC
Start: 2014-08-28 — End: 2014-08-28
  Administered 2014-08-28: 0.5 mg via RESPIRATORY_TRACT

## 2014-08-28 NOTE — Patient Instructions (Addendum)
I know you are having a lot of trouble sleeping and a lot of anxiety.  I know that to control your symptoms, you will likely need much more medication than you are currently receiving - anytime any person does not respond to the usual treatments or usual doses of medications, then we rely on specialty referrals to provide advanced treatment using better or higher doses of medications.  [facility]  Address: [address], [place], [place] [address]  [phone]  [facility] - Dr. Allena Katz Address: [address] [place], [place] [address]  [phone]  [facility] [address] [place], [id number] Phone: [phone]   I am VERY concerned that you are drinking to much alcohol at night and that it is making your insomnia, anxiety, nightsweats much worse - as long as you are drinking alcohol, you are NOT going to be able to get a good nights sleep.  I would recommend increasing your trazodone.  You cannot use xanax at night until after you stop drinking alcohol - xanax and alcohol combined will cause you to breath less and could lead to accidental or unintentional overdose.   Chronic Asthmatic Bronchitis Chronic asthmatic bronchitis is a complication of persistent asthma. After a period of time with asthma, some people develop airflow obstruction that is present all the time, even when not having an asthma attack.There is also persistent inflammation of the airways, and the bronchial tubes produce more mucus. Chronic asthmatic bronchitis usually is a permanent problem with the lungs. CAUSES  Chronic asthmatic bronchitis happens most often in people who have asthma and also smoke cigarettes. Occasionally, it can happen to a person with long-standing or severe asthma even if the person is not a smoker. SIGNS AND SYMPTOMS  Chronic asthmatic bronchitis usually causes symptoms of both asthma and  chronic bronchitis, including:   Coughing.  Increased sputum production.  Wheezing and shortness of breath.  Chest discomfort.  Recurring infections. DIAGNOSIS  Your health care provider will take a medical history and perform a physical exam. Chronic asthmatic bronchitis is suspected when a person with asthma has abnormal results on breathing tests (pulmonary function tests) even when breathing symptoms are at their best. Other tests, such as a chest X-ray, may be performed to rule out other conditions.  TREATMENT  Treatment involves controlling symptoms with medicine and lifestyle changes.  Your health care provider may prescribe asthma medicines, including inhaler and nebulizer medicines.  Infection can be treated with medicine to kill germs (antibiotics). Serious infections may require hospitalization. These can include:  Pneumonia.  Sinus infections.  Acute bronchitis.   Preventing infection and hospitalization is very important. Get an influenza vaccination every year as directed by your health care provider. Ask your health care provider whether you need a pneumonia vaccine.  Ask your health care provider whether you would benefit from a pulmonary rehabilitation program. HOME CARE INSTRUCTIONS  Take medicines only as directed by your health care provider.  If you are a cigarette smoker, the most important thing that you can do is quit. Talk to your health care provider for help with quitting smoking.  Avoid pollen, dust, animal dander, molds, smoke, and other things that cause attacks.  Regular exercise is very important to help you feel better. Discuss possible exercise routines with your health care provider.  If animal dander is the cause of asthma, you may not be able to keep pets.  It is important that you:  Become educated about your medical condition.  Participate in maintaining wellness.  Seek medical care as directed. Delay in seeking medical care could  cause permanent injury and may be a risk to your life. SEEK MEDICAL CARE IF:  You have wheezing and shortness of breath even if taking medicine to prevent attacks.  You have muscle aches, chest pain, or thickening of sputum.  Your sputum changes from clear or white to yellow, green, gray, or bloody. SEEK IMMEDIATE MEDICAL CARE IF:  Your usual medicines do not stop your wheezing.  You have increased coughing or shortness of breath or both.  You have increased difficulty breathing.  You have any problems from the medicine you are taking, such as a rash, itching, swelling, or trouble breathing. MAKE SURE YOU:   Understand these instructions.  Will watch your condition.  Will get help right away if you are not doing well or get worse. Document Released: 04/02/2006 Document Revised: 10/30/2013 Document Reviewed: 07/24/2013 [facility] Patient Information 2015 [place], [place]. This information is not intended to replace advice given to you by your health care provider. Make sure you discuss any questions you have with your health care provider.  Insomnia Insomnia is frequent trouble falling and/or staying asleep. Insomnia can be a long term problem or a short term problem. Both are common. Insomnia can be a short term problem when the wakefulness is related to a certain stress or worry. Long term insomnia is often related to ongoing stress during waking hours and/or poor sleeping habits. Overtime, sleep deprivation itself can make the problem worse. Every little thing feels more severe because you are overtired and your ability to cope is decreased. CAUSES   Stress, anxiety, and depression.  Poor sleeping habits.  Distractions such as TV in the bedroom.  Naps close to bedtime.  Engaging in emotionally charged conversations before bed.  Technical reading before sleep.  Alcohol and other sedatives. They may make the problem worse. They can hurt normal sleep patterns and normal dream  activity.  Stimulants such as caffeine for several hours prior to bedtime.  Pain syndromes and shortness of breath can cause insomnia.  Exercise late at night.  Changing time zones may cause sleeping problems (jet lag). It is sometimes helpful to have someone observe your sleeping patterns. They should look for periods of not breathing during the night (sleep apnea). They should also look to see how long those periods last. If you live alone or observers are uncertain, you can also be observed at a sleep clinic where your sleep patterns will be professionally monitored. Sleep apnea requires a checkup and treatment. Give your caregivers your medical history. Give your caregivers observations your family has made about your sleep.  SYMPTOMS   Not feeling rested in the morning.  Anxiety and restlessness at bedtime.  Difficulty falling and staying asleep. TREATMENT   Your caregiver may prescribe treatment for an underlying medical disorders. Your caregiver can give advice or help if you are using alcohol or other drugs for self-medication. Treatment of underlying problems will usually eliminate insomnia problems.  Medications can be prescribed for short time use. They are generally not recommended for lengthy use.  Over-the-counter sleep medicines are not recommended for lengthy use. They can be habit forming.  You can promote easier sleeping by making lifestyle changes such as:  Using relaxation techniques that help with breathing and reduce muscle tension.  Exercising earlier in the day.  Changing your diet and the time of your last meal.  No night time snacks.  Establish a regular time to go to bed.  Counseling can help with stressful problems and worry.  Soothing music and white noise may be helpful if there are background noises you cannot remove.  Stop tedious detailed work at least one hour before bedtime. HOME CARE INSTRUCTIONS   Keep a diary. Inform your caregiver about  your progress. This includes any medication side effects. See your caregiver regularly. Take note of:  Times when you are asleep.  Times when you are awake during the night.  The quality of your sleep.  How you feel the next day. This information will help your caregiver care for you.  Get out of bed if you are still awake after 15 minutes. Read or do some quiet activity. Keep the lights down. Wait until you feel sleepy and go back to bed.  Keep regular sleeping and waking hours. Avoid naps.  Exercise regularly.  Avoid distractions at bedtime. Distractions include watching television or engaging in any intense or detailed activity like attempting to balance the household checkbook.  Develop a bedtime ritual. Keep a familiar routine of bathing, brushing your teeth, climbing into bed at the same time each night, listening to soothing music. Routines increase the success of falling to sleep faster.  Use relaxation techniques. This can be using breathing and muscle tension release routines. It can also include visualizing peaceful scenes. You can also help control troubling or intruding thoughts by keeping your mind occupied with boring or repetitive thoughts like the old concept of counting sheep. You can make it more creative like imagining planting one beautiful flower after another in your backyard garden.  During your day, work to eliminate stress. When this is not possible use some of the previous suggestions to help reduce the anxiety that accompanies stressful situations. MAKE SURE YOU:   Understand these instructions.  Will watch your condition.  Will get help right away if you are not doing well or get worse. Document Released: 06/12/2000 Document Revised: 09/07/2011 Document Reviewed: 07/13/2007 [facility] Patient Information 2015 [place], [place]. This information is not intended to replace advice given to you by your health care provider. Make sure you discuss any questions you  have with your health care provider. Alcohol Use Disorder Alcohol use disorder is a mental disorder. It is not a one-time incident of heavy drinking. Alcohol use disorder is the excessive and uncontrollable use of alcohol over time that leads to problems with functioning in one or more areas of daily living. People with this disorder risk harming themselves and others when they drink to excess. Alcohol use disorder also can cause other mental disorders, such as mood and anxiety disorders, and serious physical problems. People with alcohol use disorder often misuse other drugs.  Alcohol use disorder is common and widespread. Some people with this disorder drink alcohol to cope with or escape from negative life events. Others drink to relieve chronic pain or symptoms of mental illness. People with a family history of alcohol use disorder are at higher risk of losing control and using alcohol to excess.  SYMPTOMS  Signs and symptoms of alcohol use disorder may include the following:   Consumption ofalcohol inlarger amounts or over a longer period of time than intended.  Multiple unsuccessful attempts to cutdown or control alcohol use.   A great deal of time spent obtaining alcohol, using alcohol, or recovering from the effects of alcohol (hangover).  A strong desire or urge to use alcohol (cravings).  Continued use of alcohol despite problems at work, school, or home because of alcohol use.   Continued use of alcohol despite problems in relationships because of alcohol use.  Continued use of alcohol in situations when it is physically hazardous, such as driving a car.  Continued use of alcohol despite awareness of a physical or psychological problem that is likely related to alcohol use. Physical problems related to alcohol use can involve the brain, heart, liver, stomach, and intestines. Psychological problems related to alcohol use include intoxication, depression, anxiety, psychosis,  delirium, and dementia.   The need for increased amounts of alcohol to achieve the same desired effect, or a decreased effect from the consumption of the same amount of alcohol (tolerance).  Withdrawal symptoms upon reducing or stopping alcohol use, or alcohol use to reduce or avoid withdrawal symptoms. Withdrawal symptoms include:  Racing heart.  Hand tremor.  Difficulty sleeping.  Nausea.  Vomiting.  Hallucinations.  Restlessness.  Seizures. DIAGNOSIS Alcohol use disorder is diagnosed through an assessment by your health care provider. Your health care provider may start by asking three or four questions to screen for excessive or problematic alcohol use. To confirm a diagnosis of alcohol use disorder, at least two symptoms must be present within a 11-month period. The severity of alcohol use disorder depends on the number of symptoms:  Mild--two or three.  Moderate--four or five.  Severe--six or more. Your health care provider may perform a physical exam or use results from lab tests to see if you have physical problems resulting from alcohol use. Your health care provider may refer you to a mental health professional for evaluation. TREATMENT  Some people with alcohol use disorder are able to reduce their alcohol use to low-risk levels. Some people with alcohol use disorder need to quit drinking alcohol. When necessary, mental health professionals with specialized training in substance use treatment can help. Your health care provider can help you decide how severe your alcohol use disorder is and what type of treatment you need. The following forms of treatment are available:   Detoxification. Detoxification involves the use of prescription medicines to prevent alcohol withdrawal symptoms in the first week after quitting. This is important for people with a history of symptoms of withdrawal and for heavy drinkers who are likely to have withdrawal symptoms. Alcohol withdrawal  can be dangerous and, in severe cases, cause death. Detoxification is usually provided in a hospital or in-patient substance use treatment facility.  Counseling or talk therapy. Talk therapy is provided by substance use treatment counselors. It addresses the reasons people use alcohol and ways to keep them from drinking again. The goals of talk therapy are to help people with alcohol use disorder find healthy activities and ways to cope with life stress, to identify and avoid triggers for alcohol use, and to handle cravings, which can cause relapse.  Medicines.Different medicines can help treat alcohol use disorder through the following actions:  Decrease alcohol cravings.  Decrease the positive reward response felt from alcohol use.  Produce an uncomfortable physical reaction when alcohol is used (aversion therapy).  Support groups. Support groups are run by people who have quit drinking. They provide emotional support, advice, and guidance. These forms of treatment are often combined. Some people with alcohol use disorder benefit from intensive combination treatment provided by specialized substance use treatment centers. Both inpatient and outpatient treatment programs are available. Document Released: 07/23/2004 Document Revised: 10/30/2013 Document Reviewed: 09/22/2012 [facility] Patient Information 2015 [place], [place]. This information  is not intended to replace advice given to you by your health care provider. Make sure you discuss any questions you have with your health care provider.  

## 2014-08-29 LAB — COMPREHENSIVE METABOLIC PANEL
ALBUMIN: 4.7 g/dL (ref 3.5–5.2)
ALT: 22 U/L (ref 0–53)
AST: 23 U/L (ref 0–37)
Alkaline Phosphatase: 95 U/L (ref 39–117)
BILIRUBIN TOTAL: 0.7 mg/dL (ref 0.2–1.2)
BUN: 13 mg/dL (ref 6–23)
CO2: 20 mEq/L (ref 19–32)
Calcium: 9.5 mg/dL (ref 8.4–10.5)
Chloride: 104 mEq/L (ref 96–112)
Creat: 0.84 mg/dL (ref 0.50–1.35)
GLUCOSE: 75 mg/dL (ref 70–99)
Potassium: 4.6 mEq/L (ref 3.5–5.3)
Sodium: 138 mEq/L (ref 135–145)
Total Protein: 7.3 g/dL (ref 6.0–8.3)

## 2014-08-29 LAB — LIPASE: Lipase: 105 U/L — ABNORMAL HIGH (ref 0–75)

## 2014-08-29 LAB — TSH: TSH: 0.942 u[IU]/mL (ref 0.350–4.500)

## 2014-08-31 LAB — ALCOHOL, METHYL (METHANOL), BLOOD: Methanol Lvl: NOT DETECTED mg/dL
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
## 2014-09-04 ENCOUNTER — Encounter: Payer: Self-pay | Admitting: Family Medicine

## 2014-12-07 ENCOUNTER — Ambulatory Visit (INDEPENDENT_AMBULATORY_CARE_PROVIDER_SITE_OTHER): Payer: BLUE CROSS/BLUE SHIELD | Admitting: Internal Medicine

## 2014-12-07 VITALS — BP 132/60 | HR 69 | Temp 98.7°F | Resp 17 | Ht 71.5 in | Wt 225.6 lb

## 2014-12-07 DIAGNOSIS — G479 Sleep disorder, unspecified: Secondary | ICD-10-CM | POA: Diagnosis not present

## 2014-12-07 DIAGNOSIS — J4520 Mild intermittent asthma, uncomplicated: Secondary | ICD-10-CM | POA: Diagnosis not present

## 2014-12-07 DIAGNOSIS — I10 Essential (primary) hypertension: Secondary | ICD-10-CM | POA: Diagnosis not present

## 2014-12-07 DIAGNOSIS — M10071 Idiopathic gout, right ankle and foot: Secondary | ICD-10-CM | POA: Diagnosis not present

## 2014-12-07 DIAGNOSIS — R05 Cough: Secondary | ICD-10-CM

## 2014-12-07 DIAGNOSIS — R059 Cough, unspecified: Secondary | ICD-10-CM

## 2014-12-07 DIAGNOSIS — N529 Male erectile dysfunction, unspecified: Secondary | ICD-10-CM

## 2014-12-07 DIAGNOSIS — F8081 Childhood onset fluency disorder: Secondary | ICD-10-CM | POA: Diagnosis not present

## 2014-12-07 DIAGNOSIS — F411 Generalized anxiety disorder: Secondary | ICD-10-CM

## 2014-12-07 MED ORDER — BECLOMETHASONE DIPROPIONATE 80 MCG/ACT IN AERS: 2.0000 | INHALATION_SPRAY | Freq: Two times a day (BID) | RESPIRATORY_TRACT | Status: DC

## 2014-12-07 MED ORDER — TEMAZEPAM 30 MG PO CAPS: 30.0000 mg | ORAL_CAPSULE | Freq: Every evening | ORAL | Status: DC | PRN

## 2014-12-07 MED ORDER — INDOMETHACIN 50 MG PO CAPS: 50.0000 mg | ORAL_CAPSULE | Freq: Three times a day (TID) | ORAL | Status: DC | PRN

## 2014-12-07 MED ORDER — BUPROPION HCL ER (XL) 150 MG PO TB24: 150.0000 mg | ORAL_TABLET | Freq: Every day | ORAL | Status: DC

## 2014-12-07 MED ORDER — TADALAFIL 20 MG PO TABS: 20.0000 mg | ORAL_TABLET | Freq: Every day | ORAL | Status: DC | PRN

## 2014-12-07 MED ORDER — ALBUTEROL SULFATE HFA 108 (90 BASE) MCG/ACT IN AERS: 2.0000 | INHALATION_SPRAY | RESPIRATORY_TRACT | Status: DC | PRN

## 2014-12-07 MED ORDER — ALPRAZOLAM 0.5 MG PO TABS: 0.5000 mg | ORAL_TABLET | Freq: Three times a day (TID) | ORAL | Status: DC | PRN

## 2014-12-07 MED ORDER — PREDNISONE 20 MG PO TABS: ORAL_TABLET | ORAL | Status: DC

## 2014-12-07 NOTE — Progress Notes (Signed)
Subjective:  This chart was scribed for [name] MD, by Veverly Fells, at [facility].  This patient was seen in room 14 and the patient's care was started at 2:33 PM.    Patient ID: Brandon Garza, male    DOB: Jan 31, 1976, 39 y.o.   MRN: [id number] Chief Complaint  Patient presents with  . Cough    Wheezing, congestion- Issues with testicles.    HPI  HPI Comments: Brandon Garza is a 39 y.o. male who presents to the [facility] complaining of a cough onset 5 days ago. He has associated symptoms of rhinorrhea and congestion.  He denies any fever/chills. He intermittently feels shortness of breath and has run out of his inhaler medication. He also has had bronchitis in the past. He is not smoking. Trazodone makes him feel very "groggy".  He has frequent problems with reactive air disease without a clear diagnosis of asthma. He quit smoking in 2015  Testicular pain: He has a dull pain in his testicles and has difficulty getting an erection.  He denies any changes in medication that could be causing his symptoms.  He thinks he may be having something going on psychologically that is having an effect on his body.  New relationship.  Anxiety: Currently he is taking Xanax for his anxiety (sometimes 3X/ day).  He has difficulty sleeping every night and sees it as one of his "biggest issues".   Trazodone left him too hung over.   He currently has a new girl friend who he has been with for a few weeks, she is the daughter of a friend of his mothers. He works at [employer] and has been there for 15 years.     Patient Active Problem List   Diagnosis Date Noted  . Viral URI with cough 06/28/2013  . GERD (gastroesophageal reflux disease) 10/20/2012  . HTN (hypertension) 02/12/2012  . Stuttering 02/12/2012  . GAD (generalized anxiety disorder) 02/12/2012  . Nicotine use disorder 02/12/2012  . Chronic back pain 02/12/2012  . Hip dislocation, left  02/12/2012  . Gout 08/04/2011   Past Medical History  Diagnosis Date  . Hypertension   . Acid reflux   . Depression with anxiety   . 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 Bronchitis   . Allergy   . Arthritis   . Neuromuscular disorder   . Anxiety    Past Surgical History  Procedure Laterality Date  . Corneal transplant      left eye  . Tonsillectomy    . Nasal septum surgery    . Orthopedic left arm surgery      done following an accident  . Left hip surgery    . Dobutamine stress echo  10/12/2008    normal   Allergies  Allergen Reactions  . Amoxicillin Hives   Prior to Admission medications   Medication Sig Start Date End Date Taking? Authorizing Provider  ALPRAZolam Prudy Feeler) 0.5 MG tablet Take 1 tablet (0.5 mg total) by mouth 3 (three) times daily as needed for anxiety. 07/06/14  Yes Peyton Najjar, MD  guaiFENesin-dextromethorphan (ROBITUSSIN DM) 100-10 MG/5ML syrup Take 5 mLs by mouth every 4 (four) hours as needed for cough. 08/28/14  Yes Sherren Mocha, MD  indomethacin (INDOCIN) 50 MG capsule Take 1 capsule (50 mg total) by mouth 3 (three) times daily as needed for moderate pain. 08/28/14  Yes Sherren Mocha, MD  metoprolol succinate (TOPROL-XL) 100 MG 24 hr tablet Take 1 tablet (100 mg total) by mouth daily. Take with or immediately following a meal. 07/06/14  Yes Peyton Najjar, MD  traZODone (DESYREL) 100 MG tablet Take 1-2 tablets (100-200 mg total) by mouth at bedtime as needed for sleep. 08/28/14  Yes Sherren Mocha, MD  albuterol (PROVENTIL HFA;VENTOLIN HFA) 108 (90 BASE) MCG/ACT inhaler Inhale 2 puffs into the lungs every 4 (four) hours as needed for wheezing or shortness of breath (cough, shortness of breath or wheezing.). Patient not taking: Reported on 12/07/2014 08/28/14   Sherren Mocha, MD  allopurinol (ZYLOPRIM) 100 MG tablet Take 1 tablet (100 mg total) by mouth daily. Patient not taking: Reported on 08/28/2014 07/06/14   Peyton Najjar, MD  benzonatate (TESSALON) 200 MG  capsule Take 1 capsule (200 mg total) by mouth 3 (three) times daily as needed for cough. Patient not taking: Reported on 12/07/2014 08/28/14   Sherren Mocha, MD  hydrOXYzine (ATARAX/VISTARIL) 25 MG tablet Take 1 tablet (25 mg total) by mouth every 6 (six) hours as needed for anxiety. Patient not taking: Reported on 12/07/2014 08/28/14   Sherren Mocha, MD   History   Social History  . Marital Status: Single    Spouse Name: N/A  . Number of Children: N/A  . Years of Education: N/A   Occupational History  . Not on file.   Social History Main Topics  . Smoking status: Former Smoker    Quit date: 07/22/2011  . Smokeless tobacco: Former Neurosurgeon    Types: Snuff, Chew    Quit date: 07/22/2011  . Alcohol Use: 3.0 - 6.0 oz/week    6-12 drink(s) per week     Comment: social  . Drug Use: No  . Sexual Activity: Yes    Birth Control/ Protection: None   Other Topics Concern  . Not on file   Social History Narrative    Review of Systems  Constitutional: Negative for fever and chills.  HENT: Positive for congestion and rhinorrhea.   Eyes: Negative for pain, discharge and itching.  Respiratory: Positive for cough, shortness of breath and wheezing. Negative for choking and chest tightness.   Gastrointestinal: Negative for nausea and vomiting.  Genitourinary: Positive for testicular pain.  Musculoskeletal: Negative for neck pain and neck stiffness.  Psychiatric/Behavioral: Positive for sleep disturbance.   see past history of gout. He has stopped allopurinol and prefers to use indomethacin when necessary.     Objective:   Physical Exam  Constitutional: He is oriented to person, place, and time. He appears well-developed and well-nourished. No distress.  HENT:  Head: Normocephalic and atraumatic.  Eyes: Conjunctivae and EOM are normal. Pupils are equal, round, and reactive to light.  Cardiovascular: Normal rate, regular rhythm and normal heart sounds.   No murmur heard. Pulmonary/Chest: Effort  normal. No respiratory distress. He has wheezes.  Wheezing bilaterally with forced expiration  Genitourinary:  No testicular tenderness or masses. No inguinal hernias.  Musculoskeletal: Normal range of motion. He exhibits no edema.  Neurological: He is alert and oriented to person, place, and time. No cranial nerve deficit. Coordination normal.  Stuttering.  Skin: Skin is warm and dry.  Psychiatric: He has a normal mood and affect. His behavior is normal. Thought content normal.  Nursing note and vitals reviewed.   Filed Vitals:   12/07/14 1415  BP: 132/60  Pulse: 69  Temp: 98.7 F (37.1 C)  TempSrc: Oral  Resp: 17  Height: 5' 11.5" (1.816 m)  Weight: 225 lb 9.6 oz (102.331 kg)  SpO2: 98%      Assessment & Plan:  I have completed the patient encounter in its entirety as documented by the scribe, with editing by me where necessary. Jebediah Macrae P. Merla Riches, M.D.  Cough  Erectile dysfunction, unspecified erectile dysfunction type - Plan: Testosterone labs//trial Cialis or Viagra  Acute idiopathic gout of right ankle - Plan: indomethacin (INDOCIN) 50 MG capsule, Uric acid  Stuttering - Plan: ALPRAZolam (XANAX) 0.5 MG tablet refilled  Generalized anxiety disorder - Plan: ALPRAZolam (XANAX) 0.5 MG tablet refilled///he is never been a Wellbutrin and this will be started to see if we can reduce his need for Xanax and improve his sleep. This may improve any social anxiety that he has during his relationship.  Sleep disturbance--- part of his anxiety disorder//trial of Restoril  Reactive airway disease, mild intermittent, uncomplicated--- because of the frequency of this we will add inhaled sterilely for the next few months to see if it can be stabilized////start with prednisone and Ventolin as well.  Essential hypertension--- controlled   Meds ordered this encounter  Medications  . indomethacin (INDOCIN) 50 MG capsule    Sig: Take 1 capsule (50 mg total) by mouth 3 (three) times  daily as needed for moderate pain.    Dispense:  30 capsule    Refill:  1  . ALPRAZolam (XANAX) 0.5 MG tablet    Sig: Take 1 tablet (0.5 mg total) by mouth 3 (three) times daily as needed for anxiety. To follow current prescription    Dispense:  90 tablet    Refill:  5  . temazepam (RESTORIL) 30 MG capsule    Sig: Take 1 capsule (30 mg total) by mouth at bedtime as needed for sleep.    Dispense:  30 capsule    Refill:  5  . buPROPion (WELLBUTRIN XL) 150 MG 24 hr tablet    Sig: Take 1 tablet (150 mg total) by mouth daily. After 5 days take 2 tabs daily    Dispense:  60 tablet    Refill:  2  . tadalafil (CIALIS) 20 MG tablet    Sig: Take 1 tablet (20 mg total) by mouth daily as needed for erectile dysfunction.    Dispense:  5 tablet    Refill:  0  . albuterol (PROVENTIL HFA;VENTOLIN HFA) 108 (90 BASE) MCG/ACT inhaler    Sig: Inhale 2 puffs into the lungs every 4 (four) hours as needed for wheezing or shortness of breath (cough, shortness of breath or wheezing.).    Dispense:  1 Inhaler    Refill:  0  . predniSONE (DELTASONE) 20 MG tablet    Sig: 3/3/2/2/1/1 single daily dose for 6 days    Dispense:  12 tablet    Refill:  0  . beclomethasone (QVAR) 80 MCG/ACT inhaler    Sig: Inhale 2 puffs into the lungs 2 (two) times daily. To use every single day as a prevention    Dispense:  1 Inhaler    Refill:  12

## 2014-12-08 LAB — URIC ACID: Uric Acid, Serum: 9.2 mg/dL — ABNORMAL HIGH (ref 4.0–7.8)

## 2014-12-09 ENCOUNTER — Encounter: Payer: Self-pay | Admitting: Internal Medicine

## 2014-12-10 LAB — TESTOSTERONE, FREE, TOTAL, SHBG
Sex Hormone Binding: 30 nmol/L (ref 10–50)
TESTOSTERONE-% FREE: 2.2 % (ref 1.6–2.9)
TESTOSTERONE: 466 ng/dL (ref 300–890)
Testosterone, Free: 102.1 pg/mL (ref 47.0–244.0)

## 2015-01-13 ENCOUNTER — Emergency Department (HOSPITAL_COMMUNITY)
Admission: EM | Admit: 2015-01-13 | Discharge: 2015-01-13 | Disposition: A | Discharge status: Home / Self Care | Payer: BLUE CROSS/BLUE SHIELD | Attending: Emergency Medicine | Admitting: Emergency Medicine

## 2015-01-13 ENCOUNTER — Encounter (HOSPITAL_COMMUNITY): Payer: Self-pay | Admitting: *Deleted

## 2015-01-13 DIAGNOSIS — I10 Essential (primary) hypertension: Secondary | ICD-10-CM | POA: Diagnosis not present

## 2015-01-13 DIAGNOSIS — Z880 Allergy status to penicillin: Secondary | ICD-10-CM | POA: Insufficient documentation

## 2015-01-13 DIAGNOSIS — Z8719 Personal history of other diseases of the digestive system: Secondary | ICD-10-CM | POA: Insufficient documentation

## 2015-01-13 DIAGNOSIS — M1990 Unspecified osteoarthritis, unspecified site: Secondary | ICD-10-CM | POA: Diagnosis not present

## 2015-01-13 DIAGNOSIS — Z87891 Personal history of nicotine dependence: Secondary | ICD-10-CM | POA: Diagnosis not present

## 2015-01-13 DIAGNOSIS — M79672 Pain in left foot: Secondary | ICD-10-CM | POA: Diagnosis not present

## 2015-01-13 DIAGNOSIS — Z8619 Personal history of other infectious and parasitic diseases: Secondary | ICD-10-CM | POA: Insufficient documentation

## 2015-01-13 DIAGNOSIS — M25521 Pain in right elbow: Secondary | ICD-10-CM | POA: Diagnosis not present

## 2015-01-13 DIAGNOSIS — F418 Other specified anxiety disorders: Secondary | ICD-10-CM | POA: Diagnosis not present

## 2015-01-13 DIAGNOSIS — F419 Anxiety disorder, unspecified: Secondary | ICD-10-CM | POA: Insufficient documentation

## 2015-01-13 DIAGNOSIS — Z79899 Other long term (current) drug therapy: Secondary | ICD-10-CM | POA: Insufficient documentation

## 2015-01-13 MED ORDER — INDOMETHACIN 25 MG PO CAPS: 50.0000 mg | ORAL_CAPSULE | Freq: Three times a day (TID) | ORAL | Status: DC

## 2015-01-13 MED ORDER — HYDROCODONE-ACETAMINOPHEN 5-325 MG PO TABS: 1.0000 | ORAL_TABLET | ORAL | Status: DC | PRN

## 2015-01-13 NOTE — ED Notes (Signed)
Pt c/o L foot and R elbow pain x3 days. Denies injury. Sts Hx of arthritis and gout and sts this feels similar to those. Pt reports some mild swelling or inflammation in foot and general pain in elbow. Certain movements worsen pain.

## 2015-01-13 NOTE — Discharge Instructions (Signed)
FOLLOW UP WITH YOUR DOCTOR FOR RECHECK IN 2-3 DAYS. TAKE MEDICATIONS AS PRESCRIBED.

## 2015-01-13 NOTE — ED Provider Notes (Signed)
CSN: [id number]     Arrival date & time 01/13/15  1135 History  This chart was scribed for Brandon AnisShari Decie Verne, PA-C, working with No att. providers found by Elon SpannerGarrett Cook, ED Scribe. This patient was seen in room WTR9/WTR9 and the patient's care was started at 11:59 AM.    Chief Complaint  Patient presents with  . Foot Pain  . Elbow Pain   HPI Comments: Pain in right elbow and left foot for the past 4 days without injury. No redness, or swelling. He reports a history of gout and this feels similar to gout pain. No fever, numbness or weakness.  The history is provided by the patient. No language interpreter was used.   HPI Comments: Brandon Garza is a 39 y.o. male with a history of gout and arthritis who presents to the Emergency   Past Medical History  Diagnosis Date  . Hypertension   . Acid reflux   . Depression with anxiety   . 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 Bronchitis   . Allergy   . Arthritis   . Neuromuscular disorder   . Anxiety    Past Surgical History  Procedure Laterality Date  . Corneal transplant      left eye  . Tonsillectomy    . Nasal septum surgery    . Orthopedic left arm surgery      done following an accident  . Left hip surgery    . Dobutamine stress echo  10/12/2008    normal   Family History  Problem Relation Age of Onset  . Heart Problems Father   . Stroke Maternal Grandfather   . Heart Problems Maternal Grandfather   . Mental illness Paternal Grandmother   . Heart Problems Paternal Grandfather    History  Substance Use Topics  . Smoking status: Former Smoker    Quit date: 07/22/2011  . Smokeless tobacco: Former NeurosurgeonUser    Types: Snuff, Chew    Quit date: 07/22/2011  . Alcohol Use: 3.0 - 6.0 oz/week    6-12 drink(s) per week     Comment: social    Review of Systems  Constitutional: Negative for fever and chills.  Musculoskeletal:       See HPI  Skin: Negative.  Negative for color change and wound.  Neurological:  Negative.  Negative for numbness.      Allergies  Amoxicillin  Home Medications   Prior to Admission medications   Medication Sig Start Date End Date Taking? Authorizing Provider  ALPRAZolam Prudy Feeler(XANAX) 0.5 MG tablet Take 1 tablet (0.5 mg total) by mouth 3 (three) times daily as needed for anxiety. To follow current prescription 12/07/14  Yes Tonye Pearsonobert P Doolittle, MD  indomethacin (INDOCIN) 50 MG capsule Take 1 capsule (50 mg total) by mouth 3 (three) times daily as needed for moderate pain. 12/07/14  Yes Tonye Pearsonobert P Doolittle, MD  metoprolol succinate (TOPROL-XL) 100 MG 24 hr tablet Take 1 tablet (100 mg total) by mouth daily. Take with or immediately following a meal. 07/06/14  Yes Peyton Najjaravid H Hopper, MD  albuterol (PROVENTIL HFA;VENTOLIN HFA) 108 (90 BASE) MCG/ACT inhaler Inhale 2 puffs into the lungs every 4 (four) hours as needed for wheezing or shortness of breath (cough, shortness of breath or wheezing.). 12/07/14   Tonye Pearsonobert P Doolittle, MD  beclomethasone (QVAR) 80 MCG/ACT inhaler Inhale 2 puffs into the lungs 2 (two) times daily. To use every single day as a prevention 12/07/14   Molly Maduroobert  P Doolittle, MD  buPROPion (WELLBUTRIN XL) 150 MG 24 hr tablet Take 1 tablet (150 mg total) by mouth daily. After 5 days take 2 tabs daily 12/07/14   Tonye Pearson, MD  predniSONE (DELTASONE) 20 MG tablet 3/3/2/2/1/1 single daily dose for 6 days Patient not taking: Reported on 01/13/2015 12/07/14   Tonye Pearson, MD  tadalafil (CIALIS) 20 MG tablet Take 1 tablet (20 mg total) by mouth daily as needed for erectile dysfunction. 12/07/14   Tonye Pearson, MD  temazepam (RESTORIL) 30 MG capsule Take 1 capsule (30 mg total) by mouth at bedtime as needed for sleep. 12/07/14   Tonye Pearson, MD   BP 146/67 mmHg  Pulse 79  Temp(Src) 98.2 F (36.8 C) (Oral)  Resp 15  SpO2 97% Physical Exam  Constitutional: He is oriented to person, place, and time. He appears well-developed and well-nourished. No distress.   HENT:  Head: Normocephalic and atraumatic.  Eyes: Conjunctivae and EOM are normal.  Neck: Normal range of motion. Neck supple. No tracheal deviation present.  Cardiovascular: Normal rate.   Pulmonary/Chest: Effort normal. No respiratory distress.  Musculoskeletal: Normal range of motion.  Right elbow minimally tender posteriorly. FROM of joint without obvious discomfort. No swelling, redness, point tenderness. Left foot minimally red to dorsal forefoot without swelling. FROM all joints of the foot. No 1st MTP tenderness. Ankle unremarkable.   Neurological: He is alert and oriented to person, place, and time.  Skin: Skin is warm and dry.  Psychiatric: He has a normal mood and affect. His behavior is normal.  Nursing note and vitals reviewed.   ED Course  Procedures (including critical care time)  DIAGNOSTIC STUDIES: Oxygen Saturation is 97% on RA, normal by my interpretation.    COORDINATION OF CARE:    Labs Review Labs Reviewed - No data to display  Imaging Review No results found.   EKG Interpretation None      MDM   Final diagnoses:  None    1. Pain, right elbow 2. Pain, left foot  Pain pattern does not follow that of gout arthritis. No injury or significant tenderness - doubt fracture injury. Will treat with supportive measures and PCP recheck next week.   I personally performed the services described in this documentation, which was scribed in my presence. The recorded information has been reviewed and is accurate.     Brandon Anis, PA-C 01/13/15 1217  Lorre Nick, MD 07/19/[facility] [id number]

## 2015-01-16 ENCOUNTER — Emergency Department (HOSPITAL_COMMUNITY): Payer: BLUE CROSS/BLUE SHIELD

## 2015-01-16 ENCOUNTER — Emergency Department (HOSPITAL_COMMUNITY)
Admission: EM | Admit: 2015-01-16 | Discharge: 2015-01-16 | Disposition: A | Discharge status: Home / Self Care | Payer: BLUE CROSS/BLUE SHIELD | Attending: Emergency Medicine | Admitting: Emergency Medicine

## 2015-01-16 ENCOUNTER — Encounter (HOSPITAL_COMMUNITY): Payer: Self-pay | Admitting: Emergency Medicine

## 2015-01-16 DIAGNOSIS — I10 Essential (primary) hypertension: Secondary | ICD-10-CM | POA: Insufficient documentation

## 2015-01-16 DIAGNOSIS — Z880 Allergy status to penicillin: Secondary | ICD-10-CM | POA: Insufficient documentation

## 2015-01-16 DIAGNOSIS — Z8619 Personal history of other infectious and parasitic diseases: Secondary | ICD-10-CM | POA: Diagnosis not present

## 2015-01-16 DIAGNOSIS — F418 Other specified anxiety disorders: Secondary | ICD-10-CM | POA: Insufficient documentation

## 2015-01-16 DIAGNOSIS — Z8669 Personal history of other diseases of the nervous system and sense organs: Secondary | ICD-10-CM | POA: Insufficient documentation

## 2015-01-16 DIAGNOSIS — M1990 Unspecified osteoarthritis, unspecified site: Secondary | ICD-10-CM | POA: Diagnosis not present

## 2015-01-16 DIAGNOSIS — M79672 Pain in left foot: Secondary | ICD-10-CM | POA: Diagnosis not present

## 2015-01-16 DIAGNOSIS — Z87891 Personal history of nicotine dependence: Secondary | ICD-10-CM | POA: Insufficient documentation

## 2015-01-16 DIAGNOSIS — Z8709 Personal history of other diseases of the respiratory system: Secondary | ICD-10-CM | POA: Diagnosis not present

## 2015-01-16 DIAGNOSIS — Z8719 Personal history of other diseases of the digestive system: Secondary | ICD-10-CM | POA: Diagnosis not present

## 2015-01-16 DIAGNOSIS — Z7951 Long term (current) use of inhaled steroids: Secondary | ICD-10-CM | POA: Diagnosis not present

## 2015-01-16 DIAGNOSIS — Z79899 Other long term (current) drug therapy: Secondary | ICD-10-CM | POA: Insufficient documentation

## 2015-01-16 LAB — BASIC METABOLIC PANEL
ANION GAP: 6 (ref 5–15)
BUN: 15 mg/dL (ref 6–20)
CO2: 24 mmol/L (ref 22–32)
Calcium: 8.7 mg/dL — ABNORMAL LOW (ref 8.9–10.3)
Chloride: 107 mmol/L (ref 101–111)
Creatinine, Ser: 0.8 mg/dL (ref 0.61–1.24)
GLUCOSE: 106 mg/dL — AB (ref 65–99)
Potassium: 4.7 mmol/L (ref 3.5–5.1)
Sodium: 137 mmol/L (ref 135–145)

## 2015-01-16 LAB — CBC WITH DIFFERENTIAL/PLATELET
BASOS ABS: 0 10*3/uL (ref 0.0–0.1)
Basophils Relative: 0 % (ref 0–1)
Eosinophils Absolute: 0.1 10*3/uL (ref 0.0–0.7)
Eosinophils Relative: 1 % (ref 0–5)
HEMATOCRIT: 46.2 % (ref 39.0–52.0)
Hemoglobin: 15.7 g/dL (ref 13.0–17.0)
LYMPHS ABS: 1.2 10*3/uL (ref 0.7–4.0)
Lymphocytes Relative: 11 % — ABNORMAL LOW (ref 12–46)
MCH: 31.3 pg (ref 26.0–34.0)
MCHC: 34 g/dL (ref 30.0–36.0)
MCV: 92 fL (ref 78.0–100.0)
Monocytes Absolute: 0.6 10*3/uL (ref 0.1–1.0)
Monocytes Relative: 6 % (ref 3–12)
Neutro Abs: 9.1 10*3/uL — ABNORMAL HIGH (ref 1.7–7.7)
Neutrophils Relative %: 82 % — ABNORMAL HIGH (ref 43–77)
PLATELETS: 237 10*3/uL (ref 150–400)
RBC: 5.02 MIL/uL (ref 4.22–5.81)
RDW: 12.1 % (ref 11.5–15.5)
WBC: 11.2 10*3/uL — ABNORMAL HIGH (ref 4.0–10.5)

## 2015-01-16 LAB — C-REACTIVE PROTEIN: CRP: 2.6 mg/dL — ABNORMAL HIGH (ref <1.0)

## 2015-01-16 LAB — SEDIMENTATION RATE: SED RATE: 1 mm/h (ref 0–16)

## 2015-01-16 MED ORDER — OXYCODONE-ACETAMINOPHEN 5-325 MG PO TABS
2.0000 | ORAL_TABLET | Freq: Once | ORAL | Status: AC
  Administered 2015-01-16: 2 via ORAL
  Filled 2015-01-16: qty 2

## 2015-01-16 MED ORDER — SULFAMETHOXAZOLE-TRIMETHOPRIM 800-160 MG PO TABS: 1.0000 | ORAL_TABLET | Freq: Two times a day (BID) | ORAL | Status: AC

## 2015-01-16 MED ORDER — INDOMETHACIN 25 MG PO CAPS: 50.0000 mg | ORAL_CAPSULE | Freq: Three times a day (TID) | ORAL | Status: DC

## 2015-01-16 MED ORDER — GADOBENATE DIMEGLUMINE 529 MG/ML IV SOLN
20.0000 mL | Freq: Once | INTRAVENOUS | Status: AC | PRN
  Administered 2015-01-16: 20 mL via INTRAVENOUS

## 2015-01-16 NOTE — ED Notes (Signed)
Pt ret from MRI, resting on stretcher, family at bedside.  Pt denies pain or other complaints at this time.  NAD. Awaiting dispo.

## 2015-01-16 NOTE — Discharge Instructions (Signed)

## 2015-01-16 NOTE — ED Notes (Signed)
Pt to MRI

## 2015-01-16 NOTE — ED Notes (Signed)
Pt ret from radiology, resting on stretcher, med per [facility].  Denies further needs/complaints at this time.  NAD.

## 2015-01-16 NOTE — ED Provider Notes (Addendum)
CSN: [id number]     Arrival date & time 01/16/15  3437 History   First MD Initiated Contact with Patient 01/16/15 [phone]     Chief Complaint  Patient presents with  . Foot Pain    left     (Consider location/radiation/quality/duration/timing/severity/associated sxs/prior Treatment) Patient is a 39 y.o. male presenting with lower extremity pain. The history is provided by the patient. No language interpreter was used.  Foot Pain This is a new problem. The current episode started more than 2 days ago. The problem occurs constantly. The problem has not changed since onset.Pertinent negatives include no chest pain, no abdominal pain, no headaches and no shortness of breath. The symptoms are aggravated by standing. The symptoms are relieved by rest. Treatments tried: narcotics, indomethacin. The treatment provided mild relief.    Past Medical History  Diagnosis Date  . Hypertension   . Acid reflux   . Depression with anxiety   . 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 Bronchitis   . Allergy   . Arthritis   . Neuromuscular disorder   . Anxiety    Past Surgical History  Procedure Laterality Date  . Corneal transplant      left eye  . Tonsillectomy    . Nasal septum surgery    . Orthopedic left arm surgery      done following an accident  . Left hip surgery    . Dobutamine stress echo  10/12/2008    normal   Family History  Problem Relation Age of Onset  . Heart Problems Father   . Stroke Maternal Grandfather   . Heart Problems Maternal Grandfather   . Mental illness Paternal Grandmother   . Heart Problems Paternal Grandfather    History  Substance Use Topics  . Smoking status: Former Smoker    Quit date: 07/22/2011  . Smokeless tobacco: Former Systems developer    Types: Snuff, Chew    Quit date: 07/22/2011  . Alcohol Use: 3.0 - 6.0 oz/week    6-12 drink(s) per week     Comment: social    Review of Systems  Constitutional: Negative for fever, activity change,  appetite change and fatigue.  HENT: Negative for congestion, facial swelling, rhinorrhea and trouble swallowing.   Eyes: Negative for photophobia and pain.  Respiratory: Negative for cough, chest tightness and shortness of breath.   Cardiovascular: Negative for chest pain and leg swelling.  Gastrointestinal: Negative for nausea, vomiting, abdominal pain, diarrhea and constipation.  Endocrine: Negative for polydipsia and polyuria.  Genitourinary: Negative for dysuria, urgency, decreased urine volume and difficulty urinating.  Musculoskeletal: Negative for back pain and gait problem.  Skin: Negative for color change, rash and wound.  Allergic/Immunologic: Negative for immunocompromised state.  Neurological: Negative for dizziness, facial asymmetry, speech difficulty, weakness, numbness and headaches.  Psychiatric/Behavioral: Negative for confusion, decreased concentration and agitation.      Allergies  Amoxicillin  Home Medications   Prior to Admission medications   Medication Sig Start Date End Date Taking? Authorizing Provider  acetaminophen (TYLENOL) 500 MG tablet Take 500-1,000 mg by mouth every 6 (six) hours as needed for mild pain or headache.   Yes Historical Provider, MD  albuterol (PROVENTIL HFA;VENTOLIN HFA) 108 (90 BASE) MCG/ACT inhaler Inhale 2 puffs into the lungs every 4 (four) hours as needed for wheezing or shortness of breath (cough, shortness of breath or wheezing.). 12/07/14  Yes Leandrew Koyanagi, MD  ALPRAZolam Duanne Moron) 0.5 MG tablet Take 1  tablet (0.5 mg total) by mouth 3 (three) times daily as needed for anxiety. To follow current prescription 12/07/14  Yes Leandrew Koyanagi, MD  buPROPion (WELLBUTRIN XL) 150 MG 24 hr tablet Take 1 tablet (150 mg total) by mouth daily. After 5 days take 2 tabs daily Patient taking differently: Take 150 mg by mouth 2 (two) times daily. After 5 days take 2 tabs daily 12/07/14  Yes Leandrew Koyanagi, MD  metoprolol succinate (TOPROL-XL)  100 MG 24 hr tablet Take 1 tablet (100 mg total) by mouth daily. Take with or immediately following a meal. 07/06/14  Yes Posey Boyer, MD  naproxen sodium (ANAPROX) 220 MG tablet Take 220 mg by mouth every 6 (six) hours as needed (pain).   Yes Historical Provider, MD  tadalafil (CIALIS) 20 MG tablet Take 1 tablet (20 mg total) by mouth daily as needed for erectile dysfunction. 12/07/14  Yes Leandrew Koyanagi, MD  temazepam (RESTORIL) 30 MG capsule Take 1 capsule (30 mg total) by mouth at bedtime as needed for sleep. Patient taking differently: Take 30 mg by mouth at bedtime as needed for sleep.  12/07/14  Yes Leandrew Koyanagi, MD  beclomethasone (QVAR) 80 MCG/ACT inhaler Inhale 2 puffs into the lungs 2 (two) times daily. To use every single day as a prevention Patient not taking: Reported on 01/13/2015 12/07/14   Leandrew Koyanagi, MD  HYDROcodone-acetaminophen (NORCO/VICODIN) 5-325 MG per tablet Take 1-2 tablets by mouth every 4 (four) hours as needed. Patient not taking: Reported on 01/16/2015 01/13/15   Charlann Lange, PA-C  indomethacin (INDOCIN) 25 MG capsule Take 2 capsules (50 mg total) by mouth 3 (three) times daily with meals. 01/16/15   Ernestina Patches, MD  predniSONE (DELTASONE) 20 MG tablet 3/3/2/2/1/1 single daily dose for 6 days Patient not taking: Reported on 01/13/2015 12/07/14   Leandrew Koyanagi, MD  sulfamethoxazole-trimethoprim (BACTRIM DS,SEPTRA DS) 800-160 MG per tablet Take 1 tablet by mouth 2 (two) times daily. 01/16/15 01/23/15  Ernestina Patches, MD   BP 126/63 mmHg  Pulse 52  Temp(Src) 98 F (36.7 C) (Oral)  Resp 18  SpO2 98% Physical Exam  Constitutional: He is oriented to person, place, and time. He appears well-developed and well-nourished. No distress.  HENT:  Head: Normocephalic and atraumatic.  Mouth/Throat: No oropharyngeal exudate.  Eyes: Pupils are equal, round, and reactive to light.  Neck: Normal range of motion. Neck supple.  Cardiovascular: Normal rate,  regular rhythm and normal heart sounds.  Exam reveals no gallop and no friction rub.   No murmur heard. Pulmonary/Chest: Effort normal and breath sounds normal. No respiratory distress. He has no wheezes. He has no rales.  Abdominal: Soft. Bowel sounds are normal. He exhibits no distension and no mass. There is no tenderness. There is no rebound and no guarding.  Musculoskeletal: Normal range of motion. He exhibits no edema or tenderness.       Feet:  Neurological: He is alert and oriented to person, place, and time.  Skin: Skin is warm and dry.  Psychiatric: He has a normal mood and affect.    ED Course  Procedures (including critical care time) Labs Review Labs Reviewed  CBC WITH DIFFERENTIAL/PLATELET - Abnormal; Notable for the following:    WBC 11.2 (*)    Neutrophils Relative % 82 (*)    Neutro Abs 9.1 (*)    Lymphocytes Relative 11 (*)    All other components within normal limits  BASIC METABOLIC PANEL - Abnormal; Notable  for the following:    Glucose, Bld 106 (*)    Calcium 8.7 (*)    All other components within normal limits  C-REACTIVE PROTEIN - Abnormal; Notable for the following:    CRP 2.6 (*)    All other components within normal limits  SEDIMENTATION RATE    Imaging Review Mr Foot Left W Wo Contrast  01/16/2015   CLINICAL DATA:  Left foot and right elbow pain. No known injury. History of gout.  EXAM: MRI OF THE LEFT FOREFOOT WITHOUT AND WITH CONTRAST  TECHNIQUE: Multiplanar, multisequence MR imaging was performed both before and after administration of intravenous contrast.  CONTRAST:  60m MULTIHANCE GADOBENATE DIMEGLUMINE 529 MG/ML IV SOLN  COMPARISON:  None.  FINDINGS: 6 mm soft tissue enhancing nodule adjacent to the lateral base of the fifth metatarsal with mild cortical erosion. There is mild circumferential cortical thickening. There is surrounding soft tissue edema. There is enhancing soft tissue between the bases of the fourth and fifth metatarsal. There is  marrow edema within the fifth metatarsal shaft.  There is no other marrow signal abnormality. No fracture or dislocation. The joint spaces are maintained. There is no joint effusion. There is small amount of fluid in the intermetatarsal bursa between the third and fourth metatarsal heads.  IMPRESSION: 1. 6 mm enhancing soft tissue nodule adjacent to the lateral cortex of the fifth metatarsal with mild cortical thinning. There is adjacent enhancing soft tissue particularly between the fourth and fifth metatarsals with mild fifth metatarsal cortical thickening. There is marrow edema within the fifth metatarsal shaft. The overall appearance is nonspecific, but may reflect a tophus secondary to gout with adjacent soft tissue inflammation. Infection is considered less likely, but not completely excluded.   Electronically Signed   By: HKathreen Devoid  On: 01/16/2015 15:27   Dg Foot Complete Left  01/16/2015   CLINICAL DATA:  Dorsal left foot pain and swelling.  EXAM: LEFT FOOT - COMPLETE 3+ VIEW  COMPARISON:  05/07/2009  FINDINGS: Left fifth metatarsal base demonstrates an area of focal cortical lucency/ bone loss with slight overlying soft tissue swelling. This is concerning for osteomyelitis involving the left fifth metatarsal. This is not the typical appearance or location for gout or arthropathy. Joint spaces are preserved. No radiopaque foreign body. No other acute osseous finding. Plantar calcaneal spurring noted.  IMPRESSION: Left fifth metatarsal base cortical lucency/bone loss with slight soft tissue swelling, nonspecific but could represent a small focus of osteomyelitis. Recommend further evaluation with extremity MRI without and with contrast.  Plantar calcaneal spur  No significant joint abnormality.   Electronically Signed   By: MJerilynn Mages  Shick M.D.   On: 01/16/2015 11:01     EKG Interpretation None      MDM   Final diagnoses:  Left foot pain    Pt is a 39y.o. male with Pmhx as above who presents  with pain over R midfoot, was seen on 7/17, but ran out of pain medicine. Pt states pain is not similar to gout attacks. Denies fever or acute injury. On PE, VSS, pt in NAD. Cardiopulm exam is benign. He has mild erythema to dorsal surface of mid foot with mild soft tissue swelling, no localized joint swelling. XR will be ordered as was not done on prior visit.    XR with small area of bony changes, possible osteo. Will get MRI foot, basic labs and inflammatory markers.  MRI with possible tophi between 4&5th metatarsals, cannot r/o infection, but  thought less likely. Labs snow mild leukocytosis,  Nml ESR, but elevated CRP. There does not appear to be indication for acute surgical intervention. Will restart indomethacin, also cover with bactrim and have him f/u with ortho.   Trinna Balloon Eakes evaluation in the Emergency Department is complete. It has been determined that no acute conditions requiring further emergency intervention are present at this time. The patient/guardian have been advised of the diagnosis and plan. We have discussed signs and symptoms that warrant return to the ED, such as changes or worsening in symptoms, pain, redness, swelling, fevers.       Ernestina Patches, MD 01/17/15 1148  Ernestina Patches, MD 01/17/15 1150

## 2015-01-16 NOTE — ED Notes (Signed)
Patient still in MRI.  

## 2015-01-16 NOTE — ED Notes (Signed)
Pt reports left foot pain , denies fall or injury. Pt was seen here for same issue. Pt reports ran out of pain meds.

## 2015-01-16 NOTE — ED Notes (Signed)
Initial COntact - pt A+Ox4, reports c/o R foot pain, sts was here for it previously but now out of medications.  Denies injury or new complaints.  Well appearing.  Skin PWD.  MAEI.  NAD.

## 2015-01-16 NOTE — ED Notes (Signed)
Spoke with MRI tech at this time, they will be unable to take pt until approx 1300-1330.  Pt aware.  Denies further needs/complaints at this time.  NAD.

## 2015-01-21 ENCOUNTER — Ambulatory Visit (INDEPENDENT_AMBULATORY_CARE_PROVIDER_SITE_OTHER): Payer: BLUE CROSS/BLUE SHIELD | Admitting: Emergency Medicine

## 2015-01-21 VITALS — BP 116/68 | HR 59 | Temp 98.5°F | Resp 18 | Ht 71.5 in | Wt 228.0 lb

## 2015-01-21 DIAGNOSIS — M1000 Idiopathic gout, unspecified site: Secondary | ICD-10-CM

## 2015-01-21 MED ORDER — PREDNISONE 10 MG PO TABS
ORAL_TABLET | ORAL | Status: DC
Start: 2015-01-21 — End: 2015-04-17

## 2015-01-21 MED ORDER — OXYCODONE-ACETAMINOPHEN 5-325 MG PO TABS: 1.0000 | ORAL_TABLET | Freq: Three times a day (TID) | ORAL | Status: DC | PRN

## 2015-01-21 NOTE — Patient Instructions (Signed)

## 2015-01-21 NOTE — Progress Notes (Addendum)
Patient ID: Brandon Garza, male   DOB: 04-May-1976, 39 y.o.   MRN: [id number]     This chart was scribed for Lesle Chris, MD by Littie Deeds, Medical Scribe. This patient was seen in room 4 and the patient's care was started at 8:38 AM.   Chief Complaint:  Chief Complaint  Patient presents with  . pain in joints    left elbow, left foot, left hand, x 7 days    HPI: Brandon Garza is a 39 y.o. male with a history of gout who reports to [facility] today complaining of arthralgias to his left hand, right elbow, left foot and right foot that started about a week and a half ago. Patient has had difficulty grasping with his left hand. He has had reduced mobility in his right arm due to the elbow pain. He has been seen at [facility] twice for the arthralgias. An MRI found a nodule between his 4th and 5th toes, and he was given antibiotics and indomethacin. The indomethacin has not been providing him with relief. Patient denies injury. He has been on prednisone in the past and has tolerated it well. Patient primarily comes here for medical care. He believes his grandfather and grandmother may have had arthritis. No FMHx of gout or arthritis in his parents or sister.  MRI impression 01/16/15: 6 mm enhancing soft tissue nodule adjacent to the lateral cortex of the fifth metatarsal with mild cortical thinning. There is adjacent enhancing soft tissue particularly between the fourth and fifth metatarsals with mild fifth metatarsal cortical thickening. There is marrow edema within the fifth metatarsal shaft. The overall appearance is nonspecific, but may reflect a tophus secondary to gout with adjacent soft tissue inflammation. Infection is considered less likely, but not completely excluded.  Patient works at [employer]  Past Medical History  Diagnosis Date  . Hypertension   . Acid reflux   . Depression with anxiety   . 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  Bronchitis   . Allergy   . Arthritis   . Neuromuscular disorder   . Anxiety    Past Surgical History  Procedure Laterality Date  . Corneal transplant      left eye  . Tonsillectomy    . Nasal septum surgery    . Orthopedic left arm surgery      done following an accident  . Left hip surgery    . Dobutamine stress echo  10/12/2008    normal   History   Social History  . Marital Status: Single    Spouse Name: N/A  . Number of Children: N/A  . Years of Education: N/A   Social History Main Topics  . Smoking status: Former Smoker    Quit date: 07/22/2011  . Smokeless tobacco: Former Neurosurgeon    Types: Snuff, Chew    Quit date: 07/22/2011  . Alcohol Use: 3.0 - 6.0 oz/week    6-12 drink(s) per week     Comment: social  . Drug Use: No  . Sexual Activity: Yes    Birth Control/ Protection: None   Other Topics Concern  . None   Social History Narrative   Family History  Problem Relation Age of Onset  . Heart Problems Father   . Stroke Maternal Grandfather   . Heart Problems Maternal Grandfather   . Mental illness Paternal Grandmother   . Heart Problems Paternal Grandfather    Allergies  Allergen Reactions  . Amoxicillin Hives   Prior to Admission medications   Medication Sig Start Date End Date Taking? Authorizing Provider  acetaminophen (TYLENOL) 500 MG tablet Take 500-1,000 mg by mouth every 6 (six) hours as needed for mild pain or headache.   Yes Historical Provider, MD  albuterol (PROVENTIL HFA;VENTOLIN HFA) 108 (90 BASE) MCG/ACT inhaler Inhale 2 puffs into the lungs every 4 (four) hours as needed for wheezing or shortness of breath (cough, shortness of breath or wheezing.). 12/07/14  Yes Tonye Pearson, MD  ALPRAZolam Prudy Feeler) 0.5 MG tablet Take 1 tablet (0.5 mg total) by mouth 3 (three) times daily as needed for anxiety. To follow current prescription 12/07/14  Yes Tonye Pearson, MD  beclomethasone (QVAR) 80 MCG/ACT inhaler Inhale 2 puffs into the lungs 2  (two) times daily. To use every single day as a prevention 12/07/14  Yes Tonye Pearson, MD  indomethacin (INDOCIN) 25 MG capsule Take 2 capsules (50 mg total) by mouth 3 (three) times daily with meals. 01/16/15  Yes Toy Cookey, MD  metoprolol succinate (TOPROL-XL) 100 MG 24 hr tablet Take 1 tablet (100 mg total) by mouth daily. Take with or immediately following a meal. 07/06/14  Yes Peyton Najjar, MD  naproxen sodium (ANAPROX) 220 MG tablet Take 220 mg by mouth every 6 (six) hours as needed (pain).   Yes Historical Provider, MD  sulfamethoxazole-trimethoprim (BACTRIM DS,SEPTRA DS) 800-160 MG per tablet Take 1 tablet by mouth 2 (two) times daily. 01/16/15 01/23/15 Yes Toy Cookey, MD  tadalafil (CIALIS) 20 MG tablet Take 1 tablet (20 mg total) by mouth daily as needed for erectile dysfunction. 12/07/14  Yes Tonye Pearson, MD  temazepam (RESTORIL) 30 MG capsule Take 1 capsule (30 mg total) by mouth at bedtime as needed for sleep. Patient taking differently: Take 30 mg by mouth at bedtime as needed for sleep.  12/07/14  Yes Tonye Pearson, MD  buPROPion (WELLBUTRIN XL) 150 MG 24 hr tablet Take 1 tablet (150 mg total) by mouth daily. After 5 days take 2 tabs daily Patient not taking: Reported on 01/21/2015 12/07/14   Tonye Pearson, MD  HYDROcodone-acetaminophen (NORCO/VICODIN) 5-325 MG per tablet Take 1-2 tablets by mouth every 4 (four) hours as needed. Patient not taking: Reported on 01/16/2015 01/13/15   Elpidio Anis, PA-C  predniSONE (DELTASONE) 20 MG tablet 3/3/2/2/1/1 single daily dose for 6 days Patient not taking: Reported on 01/13/2015 12/07/14   Tonye Pearson, MD     ROS: The patient denies fevers, chills, night sweats, unintentional weight loss, chest pain, palpitations, wheezing, dyspnea on exertion, nausea, vomiting, abdominal pain, dysuria, hematuria, melena, numbness, weakness, or tingling.   All other systems have been reviewed and were otherwise negative with the  exception of those mentioned in the HPI and as above.    PHYSICAL EXAM: Filed Vitals:   01/21/15 0823  BP: 116/68  Pulse: 59  Temp: 98.5 F (36.9 C)  Resp: 18   Body mass index is 31.36 kg/(m^2).   General: Alert, no acute distress HEENT:  Normocephalic, atraumatic, oropharynx patent. Eye: Nonie Hoyer [facility] Cardiovascular:  Regular rate and rhythm, no rubs murmurs or gallops.  No Carotid bruits, radial pulse intact. No pedal edema.  Respiratory: Clear to auscultation bilaterally.  No wheezes, rales, or rhonchi.  No cyanosis, no use of accessory musculature Abdominal: No organomegaly, abdomen is soft and non-tender, positive bowel sounds.  No masses. Musculoskeletal: Arthritic changes of the left hand. Tenderness and swelling  over the right elbow with inability to reach full extension. Left foot, there is a tender area mid foot. Skin: No rashes. Neurologic: Facial musculature symmetric. Psychiatric: Patient acts appropriately throughout our interaction. Lymphatic: No cervical or submandibular lymphadenopathy Genitourinary/Anorectal: No acute findings    LABS:    EKG/XRAY:   Primary read interpreted by Dr. Cleta Alberts at [facility].not done   ASSESSMENT/PLAN: Patient has significant gouty arthropathy. He has taken prednisone before without difficulty. We'll give him #12 oxycodone pain pills. He is started on the prednisone as his anti-inflammatory and given 1 week out of work. Referral made to rheumatology to get help with chronic treatment.I personally performed the services described in this documentation, which was scribed in my presence. The recorded information has been reviewed and is accurate. Drug data sheet pulled up. Patient did receive hydrocodone in February but no other prescriptions for hydrocodone since that time.  Earl Lites, MD    Gross sideeffects, risk and benefits, and alternatives of medications d/w patient. Patient is aware that all medications have potential  sideeffects and we are unable to predict every sideeffect or drug-drug interaction that may occur.  Lesle Chris MD 01/21/2015 9:06 AM

## 2015-01-26 ENCOUNTER — Ambulatory Visit (INDEPENDENT_AMBULATORY_CARE_PROVIDER_SITE_OTHER): Payer: BLUE CROSS/BLUE SHIELD | Admitting: Internal Medicine

## 2015-01-26 ENCOUNTER — Telehealth: Payer: Self-pay | Admitting: Emergency Medicine

## 2015-01-26 VITALS — BP 118/60 | HR 109 | Temp 98.6°F | Resp 18 | Ht 71.0 in | Wt 237.0 lb

## 2015-01-26 DIAGNOSIS — M1000 Idiopathic gout, unspecified site: Secondary | ICD-10-CM

## 2015-01-26 MED ORDER — OXYCODONE-ACETAMINOPHEN 5-325 MG PO TABS: 1.0000 | ORAL_TABLET | Freq: Three times a day (TID) | ORAL | Status: DC | PRN

## 2015-01-26 MED ORDER — COLCHICINE 0.6 MG PO TABS: 0.6000 mg | ORAL_TABLET | Freq: Two times a day (BID) | ORAL | Status: DC

## 2015-01-26 NOTE — Patient Instructions (Signed)
Take 4 more prednisone  tabs today//take 4 tomorrow and 2 on Monday--may take all as one dose Start colchicine

## 2015-01-26 NOTE — Telephone Encounter (Addendum)
Patient dropped off disability form to be completed and signed. Patient saw Dr. Cleta Alberts on 01/21/2015 and Dr. Merla Riches on 01/26/2015. Form to be completed and signed during patient's OV with Merla Riches (01/26/2015).

## 2015-01-26 NOTE — Progress Notes (Signed)
   Subjective:    Patient ID: Brandon Garza, male    DOB: 1976-01-14, 39 y.o.   MRN: [id number] This chart was scribed for [name], MD by Jolene Provost, Medical Scribe. This patient was seen in Room 4 and the patient's care was started a 4:05 PM.  Chief Complaint  Patient presents with  . Follow-up    has form for disability     HPI HPI Comments: Brandon Garza is a 39 y.o. male with a past hx of gout who presents to [facility] complaining of pain and swelling in his bilateral elbows R>L and bilateral feet for the last week. He reported to the ER for these sx earlier this week and was prescribed indomethacin, pain medication, and abx. Because this was ineffective he followed up with Dr. Cleta Alberts on 01/21/2015 and received prednisone. Pt states he has not experienced relief with these medications.  Pt has had to miss work due to his sx. Pt states he is not drinking anymore. Pt drinks a lot of soda because but is beginning to reduce reduce this as well.     Review of Systems  Constitutional: Negative for fever and chills.  Musculoskeletal: Positive for joint swelling and arthralgias.  Skin: Positive for color change. Negative for rash.       Objective:   Physical Exam  Constitutional: He is oriented to person, place, and time. He appears well-developed and well-nourished. No distress.  HENT:  Head: Normocephalic and atraumatic.  Eyes: Pupils are equal, round, and reactive to light.  Neck: Neck supple.  Cardiovascular: Normal rate.   Pulmonary/Chest: Effort normal. No respiratory distress.  Musculoskeletal: Normal range of motion.  Right elbow exquisitely tender slightly red but not high. There is only mild pain with range of motion. He has tenderness around left foot consistent with his MRI lesion.  Neurological: He is alert and oriented to person, place, and time. Coordination normal.  Skin: Skin is warm and dry. He is not diaphoretic.  Psychiatric: He has a normal mood and  affect. His behavior is normal.  Nursing note and vitals reviewed. BP 118/60 mmHg  Pulse 109  Temp(Src) 98.6 F (37 C) (Oral)  Resp 18  Ht  (1.803 m)  Wt 237 lb (107.502 kg)  BMI 33.07 kg/m2  SpO2 98%      Assessment & Plan:  Acute gout not responding to her pubic NSAIDs or prednisone Significantly elevated uric acid given that it was during  an acute attack  Start colchicine 0.6 twice a day Take 60 mg prednisone today 40 tomorrow  20 Monday Follow-up with rheumatology on August 8  He will need allopurinol 12 more oxy Congratulations on stopping alcohol   I have completed the patient encounter in its entirety as documented by the scribe, with editing by me where necessary. Bron Snellings P. Merla Riches, M.D.

## 2015-03-20 ENCOUNTER — Ambulatory Visit (INDEPENDENT_AMBULATORY_CARE_PROVIDER_SITE_OTHER): Payer: BLUE CROSS/BLUE SHIELD | Admitting: Family Medicine

## 2015-03-20 ENCOUNTER — Ambulatory Visit (INDEPENDENT_AMBULATORY_CARE_PROVIDER_SITE_OTHER): Payer: BLUE CROSS/BLUE SHIELD

## 2015-03-20 VITALS — BP 136/78 | HR 64 | Temp 98.3°F | Resp 18 | Ht 71.5 in | Wt 237.0 lb

## 2015-03-20 DIAGNOSIS — M79671 Pain in right foot: Secondary | ICD-10-CM

## 2015-03-20 DIAGNOSIS — Z8639 Personal history of other endocrine, nutritional and metabolic disease: Secondary | ICD-10-CM

## 2015-03-20 DIAGNOSIS — Z8739 Personal history of other diseases of the musculoskeletal system and connective tissue: Secondary | ICD-10-CM

## 2015-03-20 LAB — URIC ACID: URIC ACID, SERUM: 7.5 mg/dL (ref 4.0–7.8)

## 2015-03-20 MED ORDER — INDOMETHACIN 50 MG PO CAPS: 50.0000 mg | ORAL_CAPSULE | Freq: Three times a day (TID) | ORAL | Status: DC

## 2015-03-20 MED ORDER — HYDROCODONE-ACETAMINOPHEN 5-325 MG PO TABS: 1.0000 | ORAL_TABLET | Freq: Three times a day (TID) | ORAL | Status: DC | PRN

## 2015-03-20 NOTE — Patient Instructions (Signed)
Gout °Gout is when your joints become red, sore, and swell (inflamed). This is caused by the buildup of uric acid crystals in the joints. Uric acid is a chemical that is normally in the blood. If the level of uric acid gets too high in the blood, these crystals form in your joints and tissues. Over time, these crystals can form into masses near the joints and tissues. These masses can destroy bone and cause the bone to look misshapen (deformed). °HOME CARE  °· Do not take aspirin for pain. °· Only take medicine as told by your doctor. °· Rest the joint as much as you can. When in bed, keep sheets and blankets off painful areas. °· Keep the sore joints raised (elevated). °· Put warm or cold packs on painful joints. Use of warm or cold packs depends on which works best for you. °· Use crutches if the painful joint is in your leg. °· Drink enough fluids to keep your pee (urine) clear or pale yellow. Limit alcohol, sugary drinks, and drinks with fructose in them. °· Follow your diet instructions. Pay careful attention to how much protein you eat. Include fruits, vegetables, whole grains, and fat-free or low-fat milk products in your daily diet. Talk to your doctor or dietitian about the use of coffee, vitamin C, and cherries. These may help lower uric acid levels. °· Keep a healthy body weight. °GET HELP RIGHT AWAY IF:  °· You have watery poop (diarrhea), throw up (vomit), or have any side effects from medicines. °· You do not feel better in 24 hours, or you are getting worse. °· Your joint becomes suddenly more tender, and you have chills or a fever. °MAKE SURE YOU:  °· Understand these instructions. °· Will watch your condition. °· Will get help right away if you are not doing well or get worse. °Document Released: 03/24/2008 Document Revised: 10/30/2013 Document Reviewed: 01/27/2012 °ExitCare® Patient Information ©2015 [employer]. This information is not intended to replace advice given to you by your health care  Radford Pease. Make sure you discuss any questions you have with your health care Ellsworth Waldschmidt. ° °

## 2015-03-20 NOTE — Progress Notes (Signed)
   Subjective:    Patient ID: Brandon Garza, male    DOB: 07-18-1975, 39 y.o.   MRN: [id number]  HPI Patient presents for possible gout flare of right foot has been present for past 2 days. States that had really bad pain when he tightened shoelaces of new pair of shoes which felt similar to pain he felt last month when MRI reveal fluid deposits that turned out to be uric acid crystals. Completed indomethacin and foot improved. States that right foot pain does not radiate and denies redness, fever, numbness, tingling, weakness, or loss in ROM/sensation/fxn. Had slight swelling that improved. More painful with walking.  Allergies  Allergen Reactions  . Amoxicillin Hives   Review of Systems As noted above.    Objective:   Physical Exam  Constitutional: He is oriented to person, place, and time. He appears well-developed and well-nourished. No distress.  Blood pressure 136/78, pulse 64, temperature 98.3 F (36.8 C), temperature source Oral, resp. rate 18, height 5' 11.5" (1.816 m), weight 237 lb (107.502 kg), SpO2 98 %.  HENT:  Head: Normocephalic and atraumatic.  Right Ear: External ear normal.  Left Ear: External ear normal.  Eyes: Conjunctivae are normal. Right eye exhibits no discharge. Left eye exhibits no discharge. No scleral icterus.  Pulmonary/Chest: Effort normal.  Musculoskeletal: He exhibits tenderness. He exhibits no edema.       Right ankle: Normal.       Right foot: There is tenderness. There is normal range of motion, no bony tenderness, no swelling, normal capillary refill, no crepitus, no deformity and no laceration.       Feet:  Neurological: He is alert and oriented to person, place, and time. He has normal strength and normal reflexes. He displays no atrophy. No cranial nerve deficit or sensory deficit. He exhibits normal muscle tone. Coordination normal.  Skin: He is not diaphoretic.  Psychiatric: He has a normal mood and affect. His behavior is normal. Judgment  and thought content normal.   [facility] reading (PRIMARY) by  Dr. Alwyn Ren. No acute bony abnormality.     Assessment & Plan:  1. Right foot pain 2. History of gout - DG Foot Complete Right; Future - indomethacin (INDOCIN) 50 MG capsule; Take 1 capsule (50 mg total) by mouth 3 (three) times daily with meals.  Dispense: 30 capsule; Refill: 1 - HYDROcodone-acetaminophen (NORCO/VICODIN) 5-325 MG per tablet; Take 1 tablet by mouth every 8 (eight) hours as needed.  Dispense: 12 tablet; Refill: 0 - Uric Acid - indomethacin (INDOCIN) 50 MG capsule; Take 1 capsule (50 mg total) by mouth 3 (three) times daily with meals.  Dispense: 30 capsule; Refill: 1   Tishira Brewington PA-C  [facility] 03/20/2015 12:55 PM

## 2015-03-27 ENCOUNTER — Encounter: Payer: Self-pay | Admitting: Physician Assistant

## 2015-04-17 ENCOUNTER — Emergency Department (HOSPITAL_COMMUNITY)
Admission: EM | Admit: 2015-04-17 | Discharge: 2015-04-17 | Disposition: A | Discharge status: Home / Self Care | Payer: BLUE CROSS/BLUE SHIELD | Attending: Emergency Medicine | Admitting: Emergency Medicine

## 2015-04-17 ENCOUNTER — Encounter (HOSPITAL_COMMUNITY): Payer: Self-pay

## 2015-04-17 DIAGNOSIS — M1990 Unspecified osteoarthritis, unspecified site: Secondary | ICD-10-CM | POA: Diagnosis not present

## 2015-04-17 DIAGNOSIS — F418 Other specified anxiety disorders: Secondary | ICD-10-CM | POA: Insufficient documentation

## 2015-04-17 DIAGNOSIS — I10 Essential (primary) hypertension: Secondary | ICD-10-CM | POA: Diagnosis not present

## 2015-04-17 DIAGNOSIS — Z8709 Personal history of other diseases of the respiratory system: Secondary | ICD-10-CM | POA: Diagnosis not present

## 2015-04-17 DIAGNOSIS — Z79899 Other long term (current) drug therapy: Secondary | ICD-10-CM | POA: Insufficient documentation

## 2015-04-17 DIAGNOSIS — Z8619 Personal history of other infectious and parasitic diseases: Secondary | ICD-10-CM | POA: Insufficient documentation

## 2015-04-17 DIAGNOSIS — Z87891 Personal history of nicotine dependence: Secondary | ICD-10-CM | POA: Diagnosis not present

## 2015-04-17 DIAGNOSIS — K219 Gastro-esophageal reflux disease without esophagitis: Secondary | ICD-10-CM | POA: Diagnosis not present

## 2015-04-17 DIAGNOSIS — Z7951 Long term (current) use of inhaled steroids: Secondary | ICD-10-CM | POA: Insufficient documentation

## 2015-04-17 DIAGNOSIS — F419 Anxiety disorder, unspecified: Secondary | ICD-10-CM | POA: Diagnosis not present

## 2015-04-17 DIAGNOSIS — M10021 Idiopathic gout, right elbow: Secondary | ICD-10-CM | POA: Insufficient documentation

## 2015-04-17 DIAGNOSIS — M25521 Pain in right elbow: Secondary | ICD-10-CM | POA: Diagnosis present

## 2015-04-17 DIAGNOSIS — Z8669 Personal history of other diseases of the nervous system and sense organs: Secondary | ICD-10-CM | POA: Diagnosis not present

## 2015-04-17 DIAGNOSIS — M10072 Idiopathic gout, left ankle and foot: Secondary | ICD-10-CM | POA: Insufficient documentation

## 2015-04-17 DIAGNOSIS — Z880 Allergy status to penicillin: Secondary | ICD-10-CM | POA: Diagnosis not present

## 2015-04-17 DIAGNOSIS — M109 Gout, unspecified: Secondary | ICD-10-CM

## 2015-04-17 MED ORDER — INDOMETHACIN 50 MG PO CAPS: 50.0000 mg | ORAL_CAPSULE | Freq: Three times a day (TID) | ORAL | Status: DC

## 2015-04-17 MED ORDER — HYDROCODONE-ACETAMINOPHEN 5-325 MG PO TABS: ORAL_TABLET | ORAL | Status: DC

## 2015-04-17 NOTE — Discharge Instructions (Signed)
Please read and follow all provided instructions.  Your diagnoses today include:  1. Gouty arthritis     Tests performed today include:  Vital signs. See below for your results today.   Medications prescribed:   Vicodin (hydrocodone/acetaminophen) - narcotic pain medication  DO NOT drive or perform any activities that require you to be awake and alert because this medicine can make you drowsy. BE VERY CAREFUL not to take multiple medicines containing Tylenol (also called acetaminophen). Doing so can lead to an overdose which can damage your liver and cause liver failure and possibly death.   Indomethacin - anti-inflammatory pain medication  You have been prescribed an anti-inflammatory medication or NSAID. Take with food. Take smallest effective dose for the shortest duration needed for your pain. Stop taking if you experience stomach pain or vomiting.   Take any prescribed medications only as directed.  Home care instructions:  Follow any educational materials contained in this packet.  BE VERY CAREFUL not to take multiple medicines containing Tylenol (also called acetaminophen). Doing so can lead to an overdose which can damage your liver and cause liver failure and possibly death.   Follow-up instructions: Please follow-up with your primary care provider in the next 3 days for further evaluation of your symptoms.   Return instructions:   Please return to the Emergency Department if you experience worsening symptoms.   Please return if you have any other emergent concerns.  Additional Information:  Your vital signs today were: BP 149/70 mmHg   Pulse 66   Temp(Src) 98.9 F (37.2 C) (Oral)   Resp 18   SpO2 97% If your blood pressure (BP) was elevated above 135/85 this visit, please have this repeated by your doctor within one month. --------------

## 2015-04-17 NOTE — ED Notes (Signed)
Pt with gout to rt elbow and left foot.  Pt having flare up for 3 days.  Difficulty bending arm.

## 2015-04-17 NOTE — ED Provider Notes (Signed)
CSN: [id number]     Arrival date & time 04/17/15  1422 History  By signing my name below, I, Placido Sou, attest that this documentation has been prepared under the direction and in the presence of Renne Crigler, [place]. Electronically Signed: Placido Sou, ED Scribe. 04/17/2015. 2:54 PM.   Chief Complaint  Patient presents with  . Elbow Pain  . Foot Pain   The history is provided by the patient. No language interpreter was used.    HPI Comments: Brandon Garza is a 39 y.o. male, with a hx of gout and neuromuscular disorder, who presents to the Emergency Department complaining of constant, moderate, sudden onset, pain to his right elbow and medial left foot with onset 3 days ago. Pt notes that it began in his foot and then spread to the affected elbow. Pt notes a hx of gout and describes this pain as a similar feeling. He notes limited ROM in both joints and worsening pain with movement. Pt notes typically taking indomethacin and prednisone in the past for these symptoms and notes mild relief with indomethacin but is unsure of any relief with prednisone. He notes receiving an MRI in the affected foot that noted uric acid crystal buildup.   Past Medical History  Diagnosis Date  . Hypertension   . Acid reflux   . Depression with anxiety   . 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 Bronchitis   . Allergy   . Arthritis   . Neuromuscular disorder   . Anxiety    Past Surgical History  Procedure Laterality Date  . Corneal transplant      left eye  . Tonsillectomy    . Nasal septum surgery    . Orthopedic left arm surgery      done following an accident  . Left hip surgery    . Dobutamine stress echo  10/12/2008    normal   Family History  Problem Relation Age of Onset  . Heart Problems Father   . Stroke Maternal Grandfather   . Heart Problems Maternal Grandfather   . Mental illness Paternal Grandmother   . Heart Problems Paternal Grandfather    Social History   Substance Use Topics  . Smoking status: Former Smoker    Quit date: 07/22/2011  . Smokeless tobacco: Former Neurosurgeon    Types: Snuff, Chew    Quit date: 07/22/2011  . Alcohol Use: 3.0 - 6.0 oz/week    6-12 drink(s) per week     Comment: social    Review of Systems  Constitutional: Negative for activity change.  Musculoskeletal: Positive for joint swelling and arthralgias. Negative for back pain, gait problem and neck pain.  Skin: Negative for wound.  Neurological: Negative for weakness and numbness.   Allergies  Amoxicillin  Home Medications   Prior to Admission medications   Medication Sig Start Date End Date Taking? Authorizing Provider  acetaminophen (TYLENOL) 500 MG tablet Take 500-1,000 mg by mouth every 6 (six) hours as needed for mild pain or headache.    Historical Provider, MD  albuterol (PROVENTIL HFA;VENTOLIN HFA) 108 (90 BASE) MCG/ACT inhaler Inhale 2 puffs into the lungs every 4 (four) hours as needed for wheezing or shortness of breath (cough, shortness of breath or wheezing.). 12/07/14   Tonye Pearson, MD  ALPRAZolam Prudy Feeler) 0.5 MG tablet Take 1 tablet (0.5 mg total) by mouth 3 (three) times daily as needed for anxiety. To follow current prescription 12/07/14   Molly Maduro  Clemencia Course, MD  beclomethasone (QVAR) 80 MCG/ACT inhaler Inhale 2 puffs into the lungs 2 (two) times daily. To use every single day as a prevention 12/07/14   Tonye Pearson, MD  buPROPion (WELLBUTRIN XL) 150 MG 24 hr tablet Take 1 tablet (150 mg total) by mouth daily. After 5 days take 2 tabs daily 12/07/14   Tonye Pearson, MD  colchicine 0.6 MG tablet Take 1 tablet (0.6 mg total) by mouth 2 (two) times daily. 01/26/15   Tonye Pearson, MD  HYDROcodone-acetaminophen (NORCO/VICODIN) 5-325 MG per tablet Take 1 tablet by mouth every 8 (eight) hours as needed. 03/20/15   Tishira R Brewington, PA-C  indomethacin (INDOCIN) 50 MG capsule Take 1 capsule (50 mg total) by mouth 3 (three) times daily with  meals. 03/20/15   Tishira R Brewington, PA-C  metoprolol succinate (TOPROL-XL) 100 MG 24 hr tablet Take 1 tablet (100 mg total) by mouth daily. Take with or immediately following a meal. 07/06/14   Peyton Najjar, MD  naproxen sodium (ANAPROX) 220 MG tablet Take 220 mg by mouth every 6 (six) hours as needed (pain).    Historical Provider, MD  oxyCODONE-acetaminophen (ROXICET) 5-325 MG per tablet Take 1 tablet by mouth every 8 (eight) hours as needed for severe pain. Patient not taking: Reported on 03/20/2015 01/26/15   Tonye Pearson, MD  predniSONE (DELTASONE) 10 MG tablet Take 4 day for 3 days 3 a day for 3 days 2 a day for 3 days one a day for 3 days Patient not taking: Reported on 03/20/2015 01/21/15   Collene Gobble, MD  tadalafil (CIALIS) 20 MG tablet Take 1 tablet (20 mg total) by mouth daily as needed for erectile dysfunction. Patient not taking: Reported on 03/20/2015 12/07/14   Tonye Pearson, MD  temazepam (RESTORIL) 30 MG capsule Take 1 capsule (30 mg total) by mouth at bedtime as needed for sleep. Patient taking differently: Take 30 mg by mouth at bedtime as needed for sleep.  12/07/14   Tonye Pearson, MD   BP 149/70 mmHg  Pulse 66  Temp(Src) 98.9 F (37.2 C) (Oral)  Resp 18  SpO2 97%   Physical Exam  Constitutional: He appears well-developed and well-nourished.  HENT:  Head: Normocephalic and atraumatic.  Eyes: Conjunctivae are normal.  Neck: Normal range of motion. Neck supple.  Cardiovascular: Normal pulses.   Musculoskeletal: He exhibits tenderness. He exhibits no edema.       Right shoulder: Normal.       Right elbow: He exhibits swelling and effusion. He exhibits normal range of motion. Tenderness found. Olecranon process tenderness noted.       Right wrist: Normal.       Right knee: Normal.       Left knee: Normal.       Right ankle: Normal.       Left ankle: Normal.       Right foot: Normal.       Left foot: There is decreased range of motion, tenderness and  bony tenderness. There is no swelling.       Feet:  Neurological: He is alert. No sensory deficit.  Motor, sensation, and vascular distal to the injury is fully intact.   Skin: Skin is warm and dry.  Psychiatric: He has a normal mood and affect.  Nursing note and vitals reviewed.   ED Course  Procedures  DIAGNOSTIC STUDIES: Oxygen Saturation is 97% on RA, normal by my interpretation.  COORDINATION OF CARE: 2:50 PM Discussed treatment plan with pt at bedside including rx's for hydrocodone and indomethacin and pt agreed to plan.  Labs Review Labs Reviewed - No data to display  Imaging Review No results found.   EKG Interpretation None       2:58 PM Patient seen and examined.   Vital signs reviewed and are as follows: BP 149/70 mmHg  Pulse 66  Temp(Src) 98.9 F (37.2 C) (Oral)  Resp 18  SpO2 97%  Patient counseled on use of narcotic pain medications. Counseled not to combine these medications with others containing tylenol. Urged not to drink alcohol, drive, or perform any other activities that requires focus while taking these medications. The patient verbalizes understanding and agrees with the plan.  Patient urged to return with worsening symptoms or other concerns. Patient verbalized understanding and agrees with plan.     MDM   Final diagnoses:  Gouty arthritis   Patient with signs and symptoms consistent with previous flare of gouty arthritis. No concern for septic arthritis. No systemic symptoms of illness. Patient appears well, nontoxic. Discharged home with PCP follow-up.  I personally performed the services described in this documentation, which was scribed in my presence. The recorded information has been reviewed and is accurate.     Renne CriglerJoshua Delia Sitar, PA-C 04/17/15 1500  Arby BarretteMarcy Pfeiffer, MD 04/20/15 [phone]

## 2015-05-25 ENCOUNTER — Ambulatory Visit (INDEPENDENT_AMBULATORY_CARE_PROVIDER_SITE_OTHER): Payer: BLUE CROSS/BLUE SHIELD | Admitting: Internal Medicine

## 2015-05-25 VITALS — BP 148/80 | HR 88 | Temp 98.1°F | Resp 16 | Ht 71.5 in | Wt 227.0 lb

## 2015-05-25 DIAGNOSIS — M1A09X0 Idiopathic chronic gout, multiple sites, without tophus (tophi): Secondary | ICD-10-CM

## 2015-05-25 MED ORDER — COLCHICINE 0.6 MG PO TABS: 0.6000 mg | ORAL_TABLET | Freq: Two times a day (BID) | ORAL | Status: DC

## 2015-05-25 MED ORDER — HYDROCODONE-ACETAMINOPHEN 5-325 MG PO TABS: ORAL_TABLET | ORAL | Status: DC

## 2015-05-25 MED ORDER — BUPROPION HCL ER (XL) 300 MG PO TB24: 300.0000 mg | ORAL_TABLET | Freq: Every day | ORAL | Status: DC

## 2015-05-25 NOTE — Progress Notes (Signed)
Subjective:  This chart was scribed for Ellamae Siaobert Doolittle, MD by Brandon Garza, ED Scribe. This patient was seen in room 1 and the patient's care was started at 2:49 PM.   Patient ID: Brandon Garza, male    DOB: 11/30/1975, 39 y.o.   MRN: [id number] Patient here for many years HPI Chief Complaint  Patient presents with  . Gout    right elbow and left foot.  Marland Kitchen. other    pt would like to talk about other things concerning him   HPI Comments: Brandon Garza is a 39 y.o. male who presents to the [facility] complaining of gout flare.his last uric acid was 7.5. Pt was referred to rheumatology in July 2016, but was unable to make it to the appointment due to transportation issue. He's been having right elbow pain and left wrist pain worse with movement. He was seen in the ED 1 month ago with the same complaint and was treated with hydrocodone and indocin.  He ran out of colchicine and would like a refill. Pt's mother advised him to ask if gabapentin would help with the pain. Gout recurrent/unstable since July 2016--intermittent since 2013 Uric acid 8.6 7/15, 9.2 6/16 and 7.5 9/16 Never on allopurinol   He is still taking Welbrutrin. Doing well with anxiety. He quit drinking and has been doing well with this.    Patient Active Problem List   Diagnosis Date Noted  . HTN (hypertension) 02/12/2012    Priority: Medium  . Gout 08/04/2011    Priority: Medium  . Chronic back pain 02/12/2012    Priority: Low  . Viral URI with cough 06/28/2013  . GERD (gastroesophageal reflux disease) 10/20/2012  . Stuttering 02/12/2012  . GAD (generalized anxiety disorder) 02/12/2012  . Nicotine use disorder---Quit 2015 02/12/2012  . Hip dislocation, left (HCC) 02/12/2012    Allergies  Allergen Reactions  . Amoxicillin Hives   Prior to Admission medications   Medication Sig Start Date End Date Taking? Authorizing Provider  ALPRAZolam Prudy Feeler(XANAX) 0.5 MG tablet Take 1 tablet (0.5 mg  total) by mouth 3 (three) times daily as needed for anxiety. To follow current prescription 12/07/14 occas Yes Tonye Pearsonobert P Doolittle, MD  buPROPion (WELLBUTRIN XL) 150 MG 24 hr tablet Take 1 tablet (150 mg total) by mouth daily. After 5 days take 2 tabs daily 12/07/14  Yes Tonye Pearsonobert P Doolittle, MD  indomethacin (INDOCIN) 50 MG capsule Take 1 capsule (50 mg total) by mouth 3 (three) times daily with meals. 04/17/15 off Yes Renne CriglerJoshua Geiple, PA-C  metoprolol succinate (TOPROL-XL) 100 MG 24 hr tablet Take 1 tablet (100 mg total) by mouth daily. Take with or immediately following a meal. 07/06/14  Yes Peyton Najjaravid H Hopper, MD  acetaminophen (TYLENOL) 500 MG tablet Take 500-1,000 mg by mouth every 6 (six) hours as needed for mild pain or headache.    Historical Provider, MD  albuterol (PROVENTIL HFA;VENTOLIN HFA) 108 (90 BASE) MCG/ACT inhaler Inhale 2 puffs into the lungs every 4 (four) hours as needed for wheezing or shortness of breath (cough, shortness of breath or wheezing.). Patient not taking: Reported on 05/25/2015 12/07/14 off  Tonye Pearsonobert P Doolittle, MD  beclomethasone (QVAR) 80 MCG/ACT inhaler Inhale 2 puffs into the lungs 2 (two) times daily. To use every single day as a prevention Patient not taking: Reported on 05/25/2015 12/07/14 off  Tonye Pearsonobert P Doolittle, MD  colchicine 0.6 MG tablet Take 1 tablet (0.6 mg total) by mouth 2 (two) times daily.  Patient not taking: Reported on 05/25/2015 01/26/15 off  Tonye Pearson, MD  HYDROcodone-acetaminophen (NORCO/VICODIN) 5-325 MG tablet Take 1-2 tablets every 6 hours as needed for severe pain Patient not taking: Reported on 05/25/2015 04/17/15   Renne Crigler, PA-C    Review of Systems  Constitutional: Negative for fever and chills.  Gastrointestinal: Negative for nausea.  Musculoskeletal: Positive for arthralgias.  Skin: Negative for color change and rash.  Neurological: Negative for weakness and numbness.       Objective:   Physical Exam  Constitutional: He is  oriented to person, place, and time. He appears well-developed and well-nourished. No distress.  HENT:  Head: Normocephalic and atraumatic.  Eyes: Conjunctivae and EOM are normal.  Neck: Neck supple.  Cardiovascular: Normal rate.   Pulmonary/Chest: Effort normal.  Musculoskeletal: Normal range of motion.  Mildly swollen at right elbow with pain on ROM but no redness or tophi. Left wrist is not swollen. No pain with ROM   Neurological: He is alert and oriented to person, place, and time.  Skin: Skin is warm and dry.  Psychiatric: He has a normal mood and affect. His behavior is normal.  Nursing note and vitals reviewed.   Filed Vitals:   05/25/15 1434  BP: 148/80  Pulse: 88  Temp: 98.1 F (36.7 C)  TempSrc: Oral  Resp: 16  Height: 5' 11.5" (1.816 m)  Weight: 227 lb (102.967 kg)  SpO2: 97%    Assessment & Plan:   1. Idiopathic chronic gout of multiple sites without tophus   restart colchicine to control then Rheum can decide on Allopurinol  Orders Placed This Encounter  Procedures  . Ambulatory referral to Rheumatology    Referral Priority:  Routine    Referral Type:  Consultation    Referral Reason:  Specialty Services Required    Referred to Provider:  Stacey Drain, MD    Requested Specialty:  Rheumatology    Number of Visits Requested:  1   2.     GAD-contin wellbutr Meds ordered this encounter  Medications  . buPROPion (WELLBUTRIN XL) 300 MG 24 hr tablet    Sig: Take 1 tablet (300 mg total) by mouth daily. After 5 days take 2 tabs daily    Dispense:  90 tablet    Refill:  3  . colchicine 0.6 MG tablet    Sig: Take 1 tablet (0.6 mg total) by mouth 2 (two) times daily.    Dispense:  60 tablet    Refill:  1  . HYDROcodone-acetaminophen (NORCO/VICODIN) 5-325 MG tablet    Sig: Take 1-2 tablets every 6 hours as needed for severe pain    Dispense:  20 tablet    Refill:  0   By signing my name below, I, Raven Garza, attest that this documentation has been  prepared under the direction and in the presence of [name], MD.  Electronically Signed: Andrew Au, ED Scribe. 05/25/2015. 3:10 PM.  I have completed the patient encounter in its entirety as documented by the scribe, with editing by me where necessary. Robert P. Merla Riches, M.D.

## 2015-06-16 ENCOUNTER — Ambulatory Visit (INDEPENDENT_AMBULATORY_CARE_PROVIDER_SITE_OTHER): Payer: BLUE CROSS/BLUE SHIELD | Admitting: Family Medicine

## 2015-06-16 VITALS — BP 130/88 | HR 68 | Temp 98.2°F | Resp 16 | Ht 69.75 in | Wt 222.4 lb

## 2015-06-16 DIAGNOSIS — J069 Acute upper respiratory infection, unspecified: Secondary | ICD-10-CM

## 2015-06-16 DIAGNOSIS — J0101 Acute recurrent maxillary sinusitis: Secondary | ICD-10-CM | POA: Diagnosis not present

## 2015-06-16 DIAGNOSIS — M1A0710 Idiopathic chronic gout, right ankle and foot, without tophus (tophi): Secondary | ICD-10-CM

## 2015-06-16 MED ORDER — INDOMETHACIN 50 MG PO CAPS: 50.0000 mg | ORAL_CAPSULE | Freq: Three times a day (TID) | ORAL | Status: DC | PRN

## 2015-06-16 MED ORDER — MUCINEX DM MAXIMUM STRENGTH 60-1200 MG PO TB12: 1.0000 | ORAL_TABLET | Freq: Two times a day (BID) | ORAL | Status: DC

## 2015-06-16 MED ORDER — FLUTICASONE PROPIONATE 50 MCG/ACT NA SUSP: 2.0000 | Freq: Two times a day (BID) | NASAL | Status: DC

## 2015-06-16 MED ORDER — DOXYCYCLINE HYCLATE 100 MG PO CAPS: 100.0000 mg | ORAL_CAPSULE | Freq: Two times a day (BID) | ORAL | Status: DC

## 2015-06-16 NOTE — Patient Instructions (Signed)

## 2015-06-16 NOTE — Progress Notes (Signed)
Onset  Subjective:    Patient ID: Brandon Garza, male    DOB: 12/12/1975, 39 y.o.   MRN: [id number]  06/16/2015  Cough; nasal congestion; chest congestion; and Medication Refill   HPI This 39 y.o. male presents for evaluation of cough and nasal congestion.  No fever/chills; intermittent sweats.  Mild cough, chest congestion ,dizziness, History of chronic recurrent bronchitis.  No headache.  No ear pain or sore throat.  +nasal congestion and rhinorrhea and PND; yellowish clear.  +coughing a lot.  No sputum production.  Slight wheezing.  Mildly SOB with exertion.  Job is really physical; working six days a week; Scientist, physiological x 17 years.  No medications for illness.  Malaise and fatigue.  Not taking QVAR. Has only one inhaler at home and using as prescribed; out of one.   No smoking; currently chewing tobacco.  Requesting refill of Indocin which he uses for gout.  Increased frequency of gout.  Monthly gout attack.  After 1-2 days, medication starts working.  Doolittle prescribed Colchicine.  Previous use of Colchicine but Indocin seems to work better.  R foot and ankle area feels like a clamp is present.   Review of Systems  Constitutional: Negative for fever, chills, diaphoresis, activity change, appetite change and fatigue.  HENT: Positive for congestion, postnasal drip and rhinorrhea. Negative for ear pain, sinus pressure, sore throat, trouble swallowing and voice change.   Respiratory: Positive for cough, shortness of breath and wheezing.   Cardiovascular: Negative for chest pain, palpitations and leg swelling.  Gastrointestinal: Negative for nausea, vomiting, abdominal pain and diarrhea.  Endocrine: Negative for cold intolerance, heat intolerance, polydipsia, polyphagia and polyuria.  Musculoskeletal: Positive for arthralgias. Negative for joint swelling and gait problem.  Skin: Negative for color change, rash and wound.  Neurological: Positive for dizziness. Negative for tremors,  seizures, syncope, facial asymmetry, speech difficulty, weakness, light-headedness, numbness and headaches.  Psychiatric/Behavioral: Negative for sleep disturbance and dysphoric mood. The patient is not nervous/anxious.     Past Medical History  Diagnosis Date  . Hypertension   . Acid reflux   . Depression with anxiety   . 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 Bronchitis   . Allergy   . Arthritis   . Neuromuscular disorder (HCC)   . Anxiety    Past Surgical History  Procedure Laterality Date  . Corneal transplant      left eye  . Tonsillectomy    . Nasal septum surgery    . Orthopedic left arm surgery      done following an accident  . Left hip surgery    . Dobutamine stress echo  10/12/2008    normal   Allergies  Allergen Reactions  . Amoxicillin Hives   Current Outpatient Prescriptions  Medication Sig Dispense Refill  . albuterol (PROVENTIL HFA;VENTOLIN HFA) 108 (90 BASE) MCG/ACT inhaler Inhale 2 puffs into the lungs every 4 (four) hours as needed for wheezing or shortness of breath (cough, shortness of breath or wheezing.). 1 Inhaler 0  . ALPRAZolam (XANAX) 0.5 MG tablet Take 1 tablet (0.5 mg total) by mouth 3 (three) times daily as needed for anxiety. To follow current prescription 90 tablet 5  . buPROPion (WELLBUTRIN XL) 300 MG 24 hr tablet Take 1 tablet (300 mg total) by mouth daily. After 5 days take 2 tabs daily 90 tablet 3  . colchicine 0.6 MG tablet Take 1 tablet (0.6 mg total) by mouth 2 (two) times  daily. 60 tablet 1  . metoprolol succinate (TOPROL-XL) 100 MG 24 hr tablet Take 1 tablet (100 mg total) by mouth daily. Take with or immediately following a meal. 90 tablet 3  . acetaminophen (TYLENOL) 500 MG tablet Take 500-1,000 mg by mouth every 6 (six) hours as needed for mild pain or headache. Reported on 06/16/2015    . beclomethasone (QVAR) 80 MCG/ACT inhaler Inhale 2 puffs into the lungs 2 (two) times daily. To use every single day as a prevention  (Patient not taking: Reported on 05/25/2015) 1 Inhaler 12  . Dextromethorphan-Guaifenesin (MUCINEX DM MAXIMUM STRENGTH) 60-1200 MG TB12 Take 1 tablet by mouth every 12 (twelve) hours. 20 each 0  . doxycycline (VIBRAMYCIN) 100 MG capsule Take 1 capsule (100 mg total) by mouth 2 (two) times daily. 20 capsule 0  . fluticasone (FLONASE) 50 MCG/ACT nasal spray Place 2 sprays into both nostrils 2 (two) times daily. 16 g 2  . HYDROcodone-acetaminophen (NORCO/VICODIN) 5-325 MG tablet Take 1-2 tablets every 6 hours as needed for severe pain (Patient not taking: Reported on 06/16/2015) 20 tablet 0  . indomethacin (INDOCIN) 50 MG capsule Take 1 capsule (50 mg total) by mouth 3 (three) times daily as needed. 30 capsule 2  . naproxen sodium (ANAPROX) 220 MG tablet Take 220 mg by mouth every 6 (six) hours as needed (pain). Reported on 06/16/2015    . [DISCONTINUED] tadalafil (CIALIS) 20 MG tablet Take 1 tablet (20 mg total) by mouth daily as needed for erectile dysfunction. (Patient not taking: Reported on 03/20/2015) 5 tablet 0  . [DISCONTINUED] temazepam (RESTORIL) 30 MG capsule Take 1 capsule (30 mg total) by mouth at bedtime as needed for sleep. (Patient taking differently: Take 30 mg by mouth at bedtime as needed for sleep. ) 30 capsule 5   No current facility-administered medications for this visit.   Social History   Social History  . Marital Status: Single    Spouse Name: N/A  . Number of Children: N/A  . Years of Education: N/A   Occupational History  . Not on file.   Social History Main Topics  . Smoking status: Former Smoker    Quit date: 07/22/2011  . Smokeless tobacco: Former Neurosurgeon    Types: Snuff, Chew    Quit date: 07/22/2011  . Alcohol Use: 3.0 - 6.0 oz/week    6-12 drink(s) per week     Comment: social  . Drug Use: No  . Sexual Activity: Yes    Birth Control/ Protection: None   Other Topics Concern  . Not on file   Social History Narrative   Family History  Problem Relation  Age of Onset  . Heart Problems Father   . Stroke Maternal Grandfather   . Heart Problems Maternal Grandfather   . Mental illness Paternal Grandmother   . Heart Problems Paternal Grandfather        Objective:    BP 130/88 mmHg  Pulse 68  Temp(Src) 98.2 F (36.8 C) (Oral)  Resp 16  Ht 5' 9.75" (1.772 m)  Wt 222 lb 6.4 oz (100.88 kg)  BMI 32.13 kg/m2  SpO2 97% Physical Exam  Constitutional: He is oriented to person, place, and time. He appears well-developed and well-nourished. No distress.  HENT:  Head: Normocephalic and atraumatic.  Right Ear: Tympanic membrane, external ear and ear canal normal.  Left Ear: Tympanic membrane, external ear and ear canal normal.  Nose: Mucosal edema and rhinorrhea present.  Mouth/Throat: Oropharynx is clear and moist. No  oropharyngeal exudate.  Eyes: Conjunctivae and EOM are normal. Pupils are equal, round, and reactive to light.  Neck: Normal range of motion. Neck supple. Carotid bruit is not present. No thyromegaly present.  Cardiovascular: Normal rate, regular rhythm, normal heart sounds and intact distal pulses.  Exam reveals no gallop and no friction rub.   No murmur heard. Pulmonary/Chest: Effort normal and breath sounds normal. He has no wheezes. He has no rales.  Abdominal: Soft. Bowel sounds are normal. He exhibits no distension and no mass. There is no tenderness. There is no rebound and no guarding.  Lymphadenopathy:    He has no cervical adenopathy.  Neurological: He is alert and oriented to person, place, and time. No cranial nerve deficit.  Skin: Skin is warm and dry. No rash noted. He is not diaphoretic.  Psychiatric: He has a normal mood and affect. His behavior is normal.  Nursing note and vitals reviewed.  Results for orders placed or performed in visit on 03/20/15  Uric Acid  Result Value Ref Range   Uric Acid, Serum 7.5 4.0 - 7.8 mg/dL       Assessment & Plan:   1. Acute recurrent maxillary sinusitis   2. Acute  upper respiratory infection   3. Idiopathic chronic gout of right foot without tophus    -New.   -Rx for Doxycycline, Mucinex DM, Flonase provided. -refill of Indocin provided for PRN use for gouty attacks.  Referred to rheumatology by Dr. Merla Richesoolittle. -continue Albuterol qid for bronchospasm. -RTC for acute worsening.  No orders of the defined types were placed in this encounter.   Meds ordered this encounter  Medications  . DISCONTD: indomethacin (INDOCIN) 50 MG capsule    Sig: Take 1 capsule (50 mg total) by mouth 3 (three) times daily as needed.    Dispense:  30 capsule    Refill:  2    Take for shortest duration needed to control symptoms.  Marland Kitchen. doxycycline (VIBRAMYCIN) 100 MG capsule    Sig: Take 1 capsule (100 mg total) by mouth 2 (two) times daily.    Dispense:  20 capsule    Refill:  0  . Dextromethorphan-Guaifenesin (MUCINEX DM MAXIMUM STRENGTH) 60-1200 MG TB12    Sig: Take 1 tablet by mouth every 12 (twelve) hours.    Dispense:  20 each    Refill:  0  . fluticasone (FLONASE) 50 MCG/ACT nasal spray    Sig: Place 2 sprays into both nostrils 2 (two) times daily.    Dispense:  16 g    Refill:  2  . indomethacin (INDOCIN) 50 MG capsule    Sig: Take 1 capsule (50 mg total) by mouth 3 (three) times daily as needed.    Dispense:  30 capsule    Refill:  2    Take for shortest duration needed to control symptoms.    No Follow-up on file.    Kallee Nam Paulita FujitaMartin Levi Crass, M.D. [facility]  [place] [address] [place], [place]  [id number] [phone] phone [phone] fax

## 2015-06-24 ENCOUNTER — Other Ambulatory Visit: Payer: Self-pay

## 2015-06-24 MED ORDER — BUPROPION HCL ER (XL) 300 MG PO TB24: 300.0000 mg | ORAL_TABLET | Freq: Every day | ORAL | Status: DC

## 2015-06-24 NOTE — Telephone Encounter (Signed)
Pharm sent req to change RFs of bupropion to 90 day supplies. It looks like Dr Merla Richesoolittle sent in what he thought was 90 day supplies, but only gave enough at two per day for 45 days (#90). I will resend with #180.

## 2015-07-01 ENCOUNTER — Other Ambulatory Visit: Payer: Self-pay | Admitting: Family Medicine

## 2015-07-02 ENCOUNTER — Telehealth: Payer: Self-pay | Admitting: *Deleted

## 2015-07-02 NOTE — Telephone Encounter (Signed)
Spoke with patient states feeling better.   He is coming in for an appointment for xanax refill will talk to physician bout this refill then

## 2015-07-05 ENCOUNTER — Ambulatory Visit (INDEPENDENT_AMBULATORY_CARE_PROVIDER_SITE_OTHER): Payer: BLUE CROSS/BLUE SHIELD | Admitting: Family Medicine

## 2015-07-05 VITALS — BP 126/80 | HR 72 | Temp 98.6°F | Resp 20 | Ht 71.0 in | Wt 228.4 lb

## 2015-07-05 DIAGNOSIS — F8081 Childhood onset fluency disorder: Secondary | ICD-10-CM | POA: Diagnosis not present

## 2015-07-05 DIAGNOSIS — J42 Unspecified chronic bronchitis: Secondary | ICD-10-CM

## 2015-07-05 DIAGNOSIS — Z23 Encounter for immunization: Secondary | ICD-10-CM

## 2015-07-05 DIAGNOSIS — F411 Generalized anxiety disorder: Secondary | ICD-10-CM

## 2015-07-05 MED ORDER — ALBUTEROL SULFATE HFA 108 (90 BASE) MCG/ACT IN AERS
2.0000 | INHALATION_SPRAY | Freq: Four times a day (QID) | RESPIRATORY_TRACT | Status: DC | PRN
Start: 2015-07-05 — End: 2016-05-08

## 2015-07-05 MED ORDER — ALPRAZOLAM 0.5 MG PO TABS: 0.5000 mg | ORAL_TABLET | Freq: Three times a day (TID) | ORAL | Status: DC | PRN

## 2015-07-05 MED ORDER — BECLOMETHASONE DIPROPIONATE 80 MCG/ACT IN AERS: 2.0000 | INHALATION_SPRAY | Freq: Two times a day (BID) | RESPIRATORY_TRACT | Status: DC

## 2015-07-05 NOTE — Patient Instructions (Addendum)
Continue your current medications. Refills have been sent in.  Use the Qvar on a daily basis  Try to minimize the use of the alprazolam, not taking it on days that you're doing well when you don't feel like he needed.  Return in about 6 months, sooner if needed  Influenza Virus Vaccine injection What is this medicine? INFLUENZA VIRUS VACCINE (in floo EN zuh VAHY ruhs vak SEEN) helps to reduce the risk of getting influenza also known as the flu. The vaccine only helps protect you against some strains of the flu. This medicine may be used for other purposes; ask your health care provider or pharmacist if you have questions. What should I tell my health care provider before I take this medicine? They need to know if you have any of these conditions: -bleeding disorder like hemophilia -fever or infection -Guillain-Barre syndrome or other neurological problems -immune system problems -infection with the human immunodeficiency virus (HIV) or AIDS -low blood platelet counts -multiple sclerosis -an unusual or allergic reaction to influenza virus vaccine, latex, other medicines, foods, dyes, or preservatives. Different brands of vaccines contain different allergens. Some may contain latex or eggs. Talk to your doctor about your allergies to make sure that you get the right vaccine. -pregnant or trying to get pregnant -breast-feeding How should I use this medicine? This vaccine is for injection into a muscle or under the skin. It is given by a health care professional. A copy of Vaccine Information Statements will be given before each vaccination. Read this sheet carefully each time. The sheet may change frequently. Talk to your healthcare provider to see which vaccines are right for you. Some vaccines should not be used in all age groups. Overdosage: If you think you have taken too much of this medicine contact a poison control center or emergency room at once. NOTE: This medicine is only for  you. Do not share this medicine with others. What if I miss a dose? This does not apply. What may interact with this medicine? -chemotherapy or radiation therapy -medicines that lower your immune system like etanercept, anakinra, infliximab, and adalimumab -medicines that treat or prevent blood clots like warfarin -phenytoin -steroid medicines like prednisone or cortisone -theophylline -vaccines This list may not describe all possible interactions. Give your health care provider a list of all the medicines, herbs, non-prescription drugs, or dietary supplements you use. Also tell them if you smoke, drink alcohol, or use illegal drugs. Some items may interact with your medicine. What should I watch for while using this medicine? Report any side effects that do not go away within 3 days to your doctor or health care professional. Call your health care provider if any unusual symptoms occur within 6 weeks of receiving this vaccine. You may still catch the flu, but the illness is not usually as bad. You cannot get the flu from the vaccine. The vaccine will not protect against colds or other illnesses that may cause fever. The vaccine is needed every year. What side effects may I notice from receiving this medicine? Side effects that you should report to your doctor or health care professional as soon as possible: -allergic reactions like skin rash, itching or hives, swelling of the face, lips, or tongue Side effects that usually do not require medical attention (report to your doctor or health care professional if they continue or are bothersome): -fever -headache -muscle aches and pains -pain, tenderness, redness, or swelling at the injection site -tiredness This list may not describe  all possible side effects. Call your doctor for medical advice about side effects. You may report side effects to FDA at 1-800-FDA-1088. Where should I keep my medicine? The vaccine will be given by a health care  professional in a clinic, pharmacy, doctor's office, or other health care setting. You will not be given vaccine doses to store at home. NOTE: This sheet is a summary. It may not cover all possible information. If you have questions about this medicine, talk to your doctor, pharmacist, or health care provider.    2016, Elsevier/Gold Standard. (2015-01-04 10:07:28)

## 2015-07-05 NOTE — Progress Notes (Signed)
Patient ID: Brandon Garza, male    DOB: 06/27/1976  Age: 40 y.o. MRN: [id number]  Chief Complaint  Patient presents with  . 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 He has a long history of having chronic bronchitis problems and recurrent flares. He uses the Qvar, but not QUITE daily basis. It is recommended that he use it daily and this was explained to him again. He also has had an albuterol inhaler but he does not have one right now. He has a chronic anxiety and stuttering problem, and uses the Xanax 3 times a day which he has been taking for some time. He used to smoke but he no longer does. He has a male partner who is pregnant and due in June. He does not do regular exercise other than what he gets at work.  Current allergies, medications, problem list, past/family and social histories reviewed.  Objective:  BP 126/80 mmHg  Pulse 72  Temp(Src) 98.6 F (37 C) (Oral)  Resp 20  Ht 5\' 11"  (1.803 m)  Wt 228 lb 6.4 oz (103.602 kg)  BMI 31.87 kg/m2  SpO2 98%  Pleasant and oriented. No acute distress. Neck supple without nodes. Chest is clear to auscultation. Heart regular without murmur.  Assessment & Plan:   Assessment: 1. Stuttering   2. Generalized anxiety disorder   3. Chronic bronchitis, unspecified chronic bronchitis type (HCC)   4. Needs flu shot       Plan: We will re-prescribe his medications. Urged him to use the Qvar on a regular basis which helps keep the lungs open.  Orders Placed This Encounter  Procedures  . Flu Vaccine QUAD 36+ mos IM    Meds ordered this encounter  Medications  . ALPRAZolam (XANAX) 0.5 MG tablet    Sig: Take 1 tablet (0.5 mg total) by mouth 3 (three) times daily as needed for anxiety. To follow current prescription    Dispense:  90 tablet    Refill:  5  . beclomethasone (QVAR) 80 MCG/ACT inhaler    Sig: Inhale 2 puffs into the lungs 2 (two) times daily.  To use every single day as a prevention    Dispense:  1 Inhaler    Refill:  12  . albuterol (PROVENTIL HFA;VENTOLIN HFA) 108 (90 Base) MCG/ACT inhaler    Sig: Inhale 2 puffs into the lungs every 6 (six) hours as needed for wheezing or shortness of breath (cough, shortness of breath or wheezing.).    Dispense:  1 Inhaler    Refill:  0         Patient Instructions  Continue your current medications. Refills have been sent in.  Use the Qvar on a daily basis  Try to minimize the use of the alprazolam, not taking it on days that you're doing well when you don't feel like he needed.  Return in about 6 months, sooner if needed       No Follow-up on file.   HOPPER,DAVID, MD 07/05/2015

## 2015-07-12 ENCOUNTER — Other Ambulatory Visit: Payer: Self-pay | Admitting: Family Medicine

## 2015-10-07 ENCOUNTER — Emergency Department (HOSPITAL_COMMUNITY)
Admission: EM | Admit: 2015-10-07 | Discharge: 2015-10-07 | Disposition: A | Discharge status: Home / Self Care | Payer: BLUE CROSS/BLUE SHIELD | Attending: Emergency Medicine | Admitting: Emergency Medicine

## 2015-10-07 ENCOUNTER — Encounter (HOSPITAL_COMMUNITY): Payer: Self-pay | Admitting: Emergency Medicine

## 2015-10-07 DIAGNOSIS — Z8719 Personal history of other diseases of the digestive system: Secondary | ICD-10-CM | POA: Diagnosis not present

## 2015-10-07 DIAGNOSIS — Z8619 Personal history of other infectious and parasitic diseases: Secondary | ICD-10-CM | POA: Diagnosis not present

## 2015-10-07 DIAGNOSIS — Z87891 Personal history of nicotine dependence: Secondary | ICD-10-CM | POA: Diagnosis not present

## 2015-10-07 DIAGNOSIS — Z8709 Personal history of other diseases of the respiratory system: Secondary | ICD-10-CM | POA: Insufficient documentation

## 2015-10-07 DIAGNOSIS — I10 Essential (primary) hypertension: Secondary | ICD-10-CM | POA: Diagnosis not present

## 2015-10-07 DIAGNOSIS — M545 Low back pain, unspecified: Secondary | ICD-10-CM

## 2015-10-07 DIAGNOSIS — Z79899 Other long term (current) drug therapy: Secondary | ICD-10-CM | POA: Diagnosis not present

## 2015-10-07 DIAGNOSIS — M1990 Unspecified osteoarthritis, unspecified site: Secondary | ICD-10-CM | POA: Insufficient documentation

## 2015-10-07 DIAGNOSIS — Z880 Allergy status to penicillin: Secondary | ICD-10-CM | POA: Diagnosis not present

## 2015-10-07 DIAGNOSIS — G8929 Other chronic pain: Secondary | ICD-10-CM | POA: Insufficient documentation

## 2015-10-07 DIAGNOSIS — F418 Other specified anxiety disorders: Secondary | ICD-10-CM | POA: Insufficient documentation

## 2015-10-07 MED ORDER — PREDNISONE 20 MG PO TABS: 40.0000 mg | ORAL_TABLET | Freq: Every day | ORAL | Status: DC

## 2015-10-07 MED ORDER — PREDNISONE 20 MG PO TABS
60.0000 mg | ORAL_TABLET | Freq: Once | ORAL | Status: AC
  Administered 2015-10-07: 60 mg via ORAL
  Filled 2015-10-07: qty 3

## 2015-10-07 MED ORDER — NAPROXEN 500 MG PO TABS: 500.0000 mg | ORAL_TABLET | Freq: Two times a day (BID) | ORAL | Status: DC

## 2015-10-07 MED ORDER — HYDROCODONE-ACETAMINOPHEN 5-325 MG PO TABS
1.0000 | ORAL_TABLET | Freq: Once | ORAL | Status: AC
  Administered 2015-10-07: 1 via ORAL
  Filled 2015-10-07: qty 1

## 2015-10-07 MED ORDER — METHOCARBAMOL 500 MG PO TABS: 500.0000 mg | ORAL_TABLET | Freq: Two times a day (BID) | ORAL | Status: DC

## 2015-10-07 MED ORDER — KETOROLAC TROMETHAMINE 60 MG/2ML IM SOLN
30.0000 mg | Freq: Once | INTRAMUSCULAR | Status: AC
  Administered 2015-10-07: 30 mg via INTRAMUSCULAR
  Filled 2015-10-07: qty 2

## 2015-10-07 NOTE — Discharge Instructions (Signed)
Please call your primary care provider to schedule a follow up appointment within one week. Return to the ER for new or worsening symptoms.   Back Pain, Adult Back pain is very common in adults.The cause of back pain is rarely dangerous and the pain often gets better over time.The cause of your back pain may not be known. Some common causes of back pain include:  Strain of the muscles or ligaments supporting the spine.  Wear and tear (degeneration) of the spinal disks.  Arthritis.  Direct injury to the back. For many people, back pain may return. Since back pain is rarely dangerous, most people can learn to manage this condition on their own. HOME CARE INSTRUCTIONS Watch your back pain for any changes. The following actions may help to lessen any discomfort you are feeling:  Remain active. It is stressful on your back to sit or stand in one place for long periods of time. Do not sit, drive, or stand in one place for more than 30 minutes at a time. Take short walks on even surfaces as soon as you are able.Try to increase the length of time you walk each day.  Exercise regularly as directed by your health care provider. Exercise helps your back heal faster. It also helps avoid future injury by keeping your muscles strong and flexible.  Do not stay in bed.Resting more than 1-2 days can delay your recovery.  Pay attention to your body when you bend and lift. The most comfortable positions are those that put less stress on your recovering back. Always use proper lifting techniques, including:  Bending your knees.  Keeping the load close to your body.  Avoiding twisting.  Find a comfortable position to sleep. Use a firm mattress and lie on your side with your knees slightly bent. If you lie on your back, put a pillow under your knees.  Avoid feeling anxious or stressed.Stress increases muscle tension and can worsen back pain.It is important to recognize when you are anxious or  stressed and learn ways to manage it, such as with exercise.  Take medicines only as directed by your health care provider. Over-the-counter medicines to reduce pain and inflammation are often the most helpful.Your health care provider may prescribe muscle relaxant drugs.These medicines help dull your pain so you can more quickly return to your normal activities and healthy exercise.  Apply ice to the injured area:  Put ice in a plastic bag.  Place a towel between your skin and the bag.  Leave the ice on for 20 minutes, 2-3 times a day for the first 2-3 days. After that, ice and heat may be alternated to reduce pain and spasms.  Maintain a healthy weight. Excess weight puts extra stress on your back and makes it difficult to maintain good posture. SEEK MEDICAL CARE IF:  You have pain that is not relieved with rest or medicine.  You have increasing pain going down into the legs or buttocks.  You have pain that does not improve in one week.  You have night pain.  You lose weight.  You have a fever or chills. SEEK IMMEDIATE MEDICAL CARE IF:   You develop new bowel or bladder control problems.  You have unusual weakness or numbness in your arms or legs.  You develop nausea or vomiting.  You develop abdominal pain.  You feel faint.   This information is not intended to replace advice given to you by your health care provider. Make sure you discuss  any questions you have with your health care provider.   Document Released: 06/15/2005 Document Revised: 07/06/2014 Document Reviewed: 10/17/2013 Elsevier Interactive Patient Education [employer].

## 2015-10-07 NOTE — ED Notes (Signed)
Hx of back problems, states his lower back pain is flaring up. Works [employer] and does a lot of heavy lifting, did not do anything new to re-injure it.

## 2015-10-07 NOTE — ED Provider Notes (Signed)
CSN: [id number]     Arrival date & time 10/07/15  1117 History   First MD Initiated Contact with Patient 10/07/15 1125     Chief Complaint  Patient presents with  . Back Pain    HPI   Brandon Garza is an 40 y.o. male with history of chronic back pain, HTN, gout who presents to the ED for evaluation of low back pain. He states he has a history of chronic back pain that is frequently exacerbated by his job at [employer] where he does a lot of heavy lifting. He states that for the past week his pain has flared up worse than baseline. He does not remember a specific injury or inciting event. He describes the pain as a sharp constant pain in his low back that does not radiate. He denies new injury or trauma. Denies fever, chills, bowel/bladder incontinence, saddle paresthesias. States he "took a few aspirin" the other day with no relief. He states he has no other pain meds at home to try. He is requesting a work note.  Past Medical History  Diagnosis Date  . Hypertension   . Acid reflux   . Depression with anxiety   . 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 Bronchitis   . Allergy   . Arthritis   . Neuromuscular disorder (HCC)   . Anxiety    Past Surgical History  Procedure Laterality Date  . Corneal transplant      left eye  . Tonsillectomy    . Nasal septum surgery    . Orthopedic left arm surgery      done following an accident  . Left hip surgery    . Dobutamine stress echo  10/12/2008    normal   Family History  Problem Relation Age of Onset  . Heart Problems Father   . Stroke Maternal Grandfather   . Heart Problems Maternal Grandfather   . Mental illness Paternal Grandmother   . Heart Problems Paternal Grandfather    Social History  Substance Use Topics  . Smoking status: Former Smoker    Quit date: 07/22/2011  . Smokeless tobacco: Former NeurosurgeonUser    Types: Snuff, Chew    Quit date: 07/22/2011  . Alcohol Use: 3.0 - 6.0 oz/week    6-12 drink(s) per week   Comment: social    Review of Systems 10 Systems reviewed and are negative for acute change except as noted in the HPI.    Allergies  Amoxicillin  Home Medications   Prior to Admission medications   Medication Sig Start Date End Date Taking? Authorizing Provider  acetaminophen (TYLENOL) 500 MG tablet Take 500-1,000 mg by mouth every 6 (six) hours as needed for mild pain or headache. Reported on 06/16/2015    Historical Provider, MD  albuterol (PROVENTIL HFA;VENTOLIN HFA) 108 (90 Base) MCG/ACT inhaler Inhale 2 puffs into the lungs every 6 (six) hours as needed for wheezing or shortness of breath (cough, shortness of breath or wheezing.). 07/05/15   Peyton Najjaravid H Hopper, MD  ALPRAZolam Prudy Feeler(XANAX) 0.5 MG tablet Take 1 tablet (0.5 mg total) by mouth 3 (three) times daily as needed for anxiety. To follow current prescription 07/05/15   Peyton Najjaravid H Hopper, MD  beclomethasone (QVAR) 80 MCG/ACT inhaler Inhale 2 puffs into the lungs 2 (two) times daily. To use every single day as a prevention 07/05/15   Peyton Najjaravid H Hopper, MD  buPROPion (WELLBUTRIN XL) 300 MG 24 hr tablet Take 1 tablet (  300 mg total) by mouth daily. After 5 days take 2 tabs daily 06/24/15   Tonye Pearson, MD  colchicine 0.6 MG tablet Take 1 tablet (0.6 mg total) by mouth 2 (two) times daily. 05/25/15   Tonye Pearson, MD  Dextromethorphan-Guaifenesin (MUCINEX DM MAXIMUM STRENGTH) 60-1200 MG TB12 Take 1 tablet by mouth every 12 (twelve) hours. 06/16/15   Ethelda Chick, MD  doxycycline (VIBRAMYCIN) 100 MG capsule Take 1 capsule (100 mg total) by mouth 2 (two) times daily. Patient not taking: Reported on 07/05/2015 06/16/15   Ethelda Chick, MD  fluticasone [facility]) 50 MCG/ACT nasal spray Place 2 sprays into both nostrils 2 (two) times daily. Patient not taking: Reported on 07/05/2015 06/16/15   Ethelda Chick, MD  HYDROcodone-acetaminophen (NORCO/VICODIN) 5-325 MG tablet Take 1-2 tablets every 6 hours as needed for severe pain Patient not taking:  Reported on 06/16/2015 05/25/15   Tonye Pearson, MD  indomethacin (INDOCIN) 50 MG capsule Take 1 capsule (50 mg total) by mouth 3 (three) times daily as needed. 06/16/15   Ethelda Chick, MD  metoprolol succinate (TOPROL-XL) 100 MG 24 hr tablet TAKE 1 TABLET (100 MG TOTAL) BY MOUTH DAILY. TAKE WITH OR IMMEDIATELY FOLLOWING A MEAL. 07/12/15   Peyton Najjar, MD  naproxen sodium (ANAPROX) 220 MG tablet Take 220 mg by mouth every 6 (six) hours as needed (pain). Reported on 07/05/2015    Historical Provider, MD   BP 145/96 mmHg  Pulse 60  Temp(Src) 97.8 F (36.6 C) (Oral)  Resp 18  SpO2 99% Physical Exam  Constitutional: He is oriented to person, place, and time. No distress.  Appears uncomfortable  HENT:  Head: Atraumatic.  Right Ear: External ear normal.  Left Ear: External ear normal.  Nose: Nose normal.  Eyes: Conjunctivae are normal. No scleral icterus.  Neck: Normal range of motion. Neck supple.  Cardiovascular: Normal rate and regular rhythm.   Pulmonary/Chest: Effort normal. No respiratory distress. He exhibits no tenderness.  Abdominal: Soft. He exhibits no distension. There is no tenderness.  Musculoskeletal:  Diffuse low back ttp. No c-spine or t-spine tenderness. No stepoff or deformity. Steady gait. Intact strength and sensation in bilateral UE and LE.   Neurological: He is alert and oriented to person, place, and time.  Skin: Skin is warm and dry. He is not diaphoretic.  Psychiatric: He has a normal mood and affect. His behavior is normal.  Nursing note and vitals reviewed.   ED Course  Procedures (including critical care time) Labs Review Labs Reviewed - No data to display  Imaging Review No results found. I have personally reviewed and evaluated these images and lab results as part of my medical decision-making.   EKG Interpretation None      MDM   Final diagnoses:  Bilateral low back pain without sciatica    Will give norco, toradol, and prednisone  here. Pt with intact neuro exam, otherwise nonfocal exam. He is afebrile with no red flags for cauda equina or other acute/emergent spinal process. Anticipate d/c home with nsaids, robaxin, and few days of prednisone. Will give work note.   Pain improved in the ED. Rx given as above. Work note given. Instructed to f/u with PCP. ER return precautions given.   Carlene Coria, PA-C 10/07/15 1335  Samuel Jester, DO 10/11/15 [phone]

## 2015-10-23 ENCOUNTER — Ambulatory Visit (INDEPENDENT_AMBULATORY_CARE_PROVIDER_SITE_OTHER): Payer: BLUE CROSS/BLUE SHIELD | Admitting: Emergency Medicine

## 2015-10-23 ENCOUNTER — Ambulatory Visit (INDEPENDENT_AMBULATORY_CARE_PROVIDER_SITE_OTHER): Payer: BLUE CROSS/BLUE SHIELD

## 2015-10-23 VITALS — BP 130/84 | HR 77 | Temp 98.0°F | Resp 18 | Ht 71.0 in | Wt 234.6 lb

## 2015-10-23 DIAGNOSIS — M549 Dorsalgia, unspecified: Secondary | ICD-10-CM

## 2015-10-23 DIAGNOSIS — M5136 Other intervertebral disc degeneration, lumbar region: Secondary | ICD-10-CM

## 2015-10-23 MED ORDER — TRAMADOL HCL 50 MG PO TABS: 50.0000 mg | ORAL_TABLET | Freq: Three times a day (TID) | ORAL | Status: DC | PRN

## 2015-10-23 MED ORDER — PREDNISONE 10 MG PO TABS: ORAL_TABLET | ORAL | Status: DC

## 2015-10-23 NOTE — Patient Instructions (Addendum)
   IF you received an x-ray today, you will receive an invoice from [facility]. Please contact [facility] at [phone] with questions or concerns regarding your invoice.   IF you received labwork today, you will receive an invoice from [facility]. Please contact [facility] at [phone] with questions or concerns regarding your invoice.   Our billing staff will not be able to assist you with questions regarding bills from these companies.  You will be contacted with the lab results as soon as they are available. The fastest way to get your results is to activate your My Chart account. Instructions are located on the last page of this paperwork. If you have not heard from us regarding the results in 2 weeks, please contact this office.    Degenerative Disk Disease Degenerative disk disease is a condition caused by the changes that occur in spinal disks as you grow older. Spinal disks are soft and compressible disks located between the bones of your spine (vertebrae). These disks act like shock absorbers. Degenerative disk disease can affect the whole spine. However, the neck and lower back are most commonly affected. Many changes can occur in the spinal disks with aging, such as:  The spinal disks may dry and shrink.  Small tears may occur in the tough, outer covering of the disk (annulus).  The disk space may become smaller due to loss of water.  Abnormal growths in the bone (spurs) may occur. This can put pressure on the nerve roots exiting the spinal canal, causing pain.  The spinal canal may become narrowed. RISK FACTORS   Being overweight.  Having a family history of degenerative disk disease.  Smoking.  There is increased risk if you are doing heavy lifting or have a sudden injury. SIGNS AND SYMPTOMS  Symptoms vary from person to person and may include:  Pain that varies in intensity. Some people have no pain, while others  have severe pain. The location of the pain depends on the part of your backbone that is affected.  You will have neck or arm pain if a disk in the neck area is affected.  You will have pain in your back, buttocks, or legs if a disk in the lower back is affected.  Pain that becomes worse while bending, reaching up, or with twisting movements.  Pain that may start gradually and then get worse as time passes. It may also start after a major or minor injury.  Numbness or tingling in the arms or legs. DIAGNOSIS  Your health care provider will ask you about your symptoms and about activities or habits that may cause the pain. He or she may also ask about any injuries, diseases, or treatments you have had. Your health care provider will examine you to check for the range of movement that is possible in the affected area, to check for strength in your extremities, and to check for sensation in the areas of the arms and legs supplied by different nerve roots. You may also have:   An X-ray of the spine.  Other imaging tests, such as MRI. TREATMENT  Your health care provider will advise you on the best plan for treatment. Treatment may include:  Medicines.  Rehabilitation exercises. HOME CARE INSTRUCTIONS   Follow proper lifting and walking techniques as advised by your health care provider.  Maintain good posture.  Exercise regularly as advised by your health care provider.  Perform relaxation exercises.  Change your sitting, standing, and   sleeping habits as advised by your health care provider.  Change positions frequently.  Lose weight or maintain a healthy weight as advised by your health care provider.  Do not use any tobacco products, including cigarettes, chewing tobacco, or electronic cigarettes. If you need help quitting, ask your health care provider.  Wear supportive footwear.  Take medicines only as directed by your health care provider. SEEK MEDICAL CARE IF:   Your  pain does not go away within 1-4 weeks.  You have significant appetite or weight loss. SEEK IMMEDIATE MEDICAL CARE IF:   Your pain is severe.  You notice weakness in your arms, hands, or legs.  You begin to lose control of your bladder or bowel movements.  You have fevers or night sweats. MAKE SURE YOU:   Understand these instructions.  Will watch your condition.  Will get help right away if you are not doing well or get worse.   This information is not intended to replace advice given to you by your health care provider. Make sure you discuss any questions you have with your health care provider.   Document Released: 04/12/2007 Document Revised: 07/06/2014 Document Reviewed: 10/17/2013 Elsevier Interactive Patient Education 2016 [employer]  

## 2015-10-23 NOTE — Progress Notes (Addendum)
Patient ID: Brandon Garza, male   DOB: 06/09/1976, 40 y.o.   MRN: [id number]    By signing my name below, I, [name], attest that this documentation has been prepared under the direction and in the presence of Collene GobbleSteven A Ermon Sagan, MD Electronically Signed: Charline BillsEssence Howell, ED Scribe 10/23/2015 at 2:23 PM.  Chief Complaint:  Chief Complaint  Patient presents with  . Back Pain    lower back pain x 2-3 days, per pt has had this pain before , pain due to old injury    HPI: Brandon Garza is a 40 y.o. male who [facility] complaining of intermittent low back pain for several weeks, constant for the past 2-3 days. Pt was seen in the ED 16 days ago for the same; was treated with Norco, Toradol and Prednisone which he states only provided temporary relief. Today, pt reports constant low back pain that radiates into his left thigh with bending. 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 He has tried Tylenol and a heat patch without significant relief. Pt denies dysuria and difficulty urinating. He has had no loss of bowel or bladder control.  Past Medical History  Diagnosis Date  . Hypertension   . Acid reflux   . Depression with anxiety   . 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 Bronchitis   . Allergy   . Arthritis   . Neuromuscular disorder (HCC)   . Anxiety    Past Surgical History  Procedure Laterality Date  . Corneal transplant      left eye  . Tonsillectomy    . Nasal septum surgery    . Orthopedic left arm surgery      done following an accident  . Left hip surgery    . Dobutamine stress echo  10/12/2008    normal   Social History   Social History  . Marital Status: Single    Spouse Name: N/A  . Number of Children: N/A  . Years of Education: N/A   Social History Main Topics  . Smoking status: Former  Smoker    Quit date: 07/22/2011  . Smokeless tobacco: Former NeurosurgeonUser    Types: Snuff, Chew    Quit date: 07/22/2011  . Alcohol Use: 3.0 - 6.0 oz/week    6-12 drink(s) per week     Comment: social  . Drug Use: No  . Sexual Activity: Yes    Birth Control/ Protection: None   Other Topics Concern  . None   Social History Narrative   Family History  Problem Relation Age of Onset  . Heart Problems Father   . Stroke Maternal Grandfather   . Heart Problems Maternal Grandfather   . Mental illness Paternal Grandmother   . Heart Problems Paternal Grandfather    Allergies  Allergen Reactions  . Amoxicillin Hives   Prior to Admission medications   Medication Sig Start Date End Date Taking? Authorizing Provider  acetaminophen (TYLENOL) 500 MG tablet Take 500-1,000 mg by mouth every 6 (six) hours as needed for mild pain or headache. Reported on 06/16/2015   Yes Historical Provider, MD  albuterol (PROVENTIL HFA;VENTOLIN HFA) 108 (90 Base) MCG/ACT inhaler Inhale 2 puffs into the lungs every 6 (six) hours as needed for wheezing or shortness of breath (cough, shortness of  breath or wheezing.). 07/05/15  Yes Peyton Najjar, MD  ALPRAZolam Prudy Feeler) 0.5 MG tablet Take 1 tablet (0.5 mg total) by mouth 3 (three) times daily as needed for anxiety. To follow current prescription 07/05/15  Yes Peyton Najjar, MD  beclomethasone (QVAR) 80 MCG/ACT inhaler Inhale 2 puffs into the lungs 2 (two) times daily. To use every single day as a prevention 07/05/15  Yes Peyton Najjar, MD  buPROPion (WELLBUTRIN XL) 300 MG 24 hr tablet Take 1 tablet (300 mg total) by mouth daily. After 5 days take 2 tabs daily 06/24/15  Yes Tonye Pearson, MD  colchicine 0.6 MG tablet Take 1 tablet (0.6 mg total) by mouth 2 (two) times daily. 05/25/15  Yes Tonye Pearson, MD  indomethacin (INDOCIN) 50 MG capsule Take 1 capsule (50 mg total) by mouth 3 (three) times daily as needed. 06/16/15  Yes Ethelda Chick, MD  metoprolol succinate  (TOPROL-XL) 100 MG 24 hr tablet TAKE 1 TABLET (100 MG TOTAL) BY MOUTH DAILY. TAKE WITH OR IMMEDIATELY FOLLOWING A MEAL. 07/12/15  Yes Peyton Najjar, MD  HYDROcodone-acetaminophen (NORCO/VICODIN) 5-325 MG tablet Take 1-2 tablets every 6 hours as needed for severe pain Patient not taking: Reported on 06/16/2015 05/25/15   Tonye Pearson, MD   ROS: The patient denies fevers, chills, night sweats, unintentional weight loss, chest pain, palpitations, wheezing, dyspnea on exertion, nausea, vomiting, abdominal pain, hematuria, melena, weakness.  All other systems have been reviewed and were otherwise negative with the exception of those mentioned in the HPI and as above.    PHYSICAL EXAM: Filed Vitals:   10/23/15 1302  BP: 150/80  Pulse: 77  Temp: 98 F (36.7 C)  Resp: 18   Body mass index is 32.73 kg/(m^2).  General: Alert, no acute distress. Appears to be in discomfort.  HEENT:  Normocephalic, atraumatic, oropharynx patent. Eye: [facility]ie [facility] Cardiovascular: Regular rate and rhythm, no rubs murmurs or gallops. No Carotid bruits, radial pulse intact. No pedal edema.  Respiratory: Clear to auscultation bilaterally. No wheezes, rales, or rhonchi. No cyanosis, no use of accessory musculature Abdominal: No organomegaly, abdomen is soft and non-tender, positive bowel sounds. No masses. Musculoskeletal: Gait intact. No edema. Tenderness from L2-L5 in the midline and both sides of back.  Skin: No rashes. Neurologic: Facial musculature symmetric. Speech impediment. Knee reflexes are 2, absent ankle reflexes. No motor weakness.  Psychiatric: Patient acts appropriately throughout our interaction. Lymphatic: No cervical or submandibular lymphadenopathy  LABS:  EKG/XRAY:   Primary read interpreted by Dr. Cleta Alberts at [facility]  Dg Lumbar Spine Complete  10/23/2015  CLINICAL DATA:  39 year old male with intermittent lumbar back pain for several weeks, progressed for 3 days. No known injury. Initial  encounter. EXAM: LUMBAR SPINE - COMPLETE 4+ VIEW COMPARISON:  Lumbar radiographs 10/01/2010.  Lumbar MRI 04/23/2009. FINDINGS: Normal lumbar segmentation, corresponding to the numbering system on the prior MRI. Stable vertebral height and alignment. Disc spaces appear preserved except at L1-L2 where there is chronic and progressed disc space loss plus chronic endplate spurring. No pars fracture. Mild to moderate L5-S1 facet hypertrophy appears chronic. SI joints within normal limits. IMPRESSION: 1. L1-L2 chronic disc degeneration, with progressed disc space loss since 2012, and chronic endplate spurring. 2. Chronic L5-S1 facet degeneration, not significantly changed. 3.  No acute osseous abnormality identified in the lumbar spine. Electronically Signed   By: Odessa Fleming M.D.   On: 10/23/2015 15:48    ASSESSMENT/PLAN: We'll treat with prednisone taper proceed  with MRI patient taken out of work. I personally performed the services described in this documentation, which was scribed in my presence. The recorded information has been reviewed and is accurate.    Gross sideeffects, risk and benefits, and alternatives of medications d/w patient. Patient is aware that all medications have potential sideeffects and we are unable to predict every sideeffect or drug-drug interaction that may occur.  Lesle Chris MD 10/23/2015 2:11 PM

## 2015-10-28 ENCOUNTER — Ambulatory Visit (INDEPENDENT_AMBULATORY_CARE_PROVIDER_SITE_OTHER): Payer: BLUE CROSS/BLUE SHIELD | Admitting: Emergency Medicine

## 2015-10-28 VITALS — BP 142/90 | HR 53 | Temp 98.6°F | Resp 17 | Ht 71.0 in | Wt 238.0 lb

## 2015-10-28 DIAGNOSIS — M5489 Other dorsalgia: Secondary | ICD-10-CM | POA: Diagnosis not present

## 2015-10-28 LAB — POCT CBC
GRANULOCYTE PERCENT: 68.9 % (ref 37–80)
HCT, POC: 41.8 % — AB (ref 43.5–53.7)
HEMOGLOBIN: 14.8 g/dL (ref 14.1–18.1)
Lymph, poc: 2.3 (ref 0.6–3.4)
MCH, POC: 32 pg — AB (ref 27–31.2)
MCHC: 35.4 g/dL (ref 31.8–35.4)
MCV: 90.4 fL (ref 80–97)
MID (cbc): 0.2 (ref 0–0.9)
MPV: 8.3 fL (ref 0–99.8)
PLATELET COUNT, POC: 191 10*3/uL (ref 142–424)
POC GRANULOCYTE: 5.4 (ref 2–6.9)
POC LYMPH %: 29.2 % (ref 10–50)
POC MID %: 1.9 %M (ref 0–12)
RBC: 4.62 M/uL — AB (ref 4.69–6.13)
RDW, POC: 13 %
WBC: 7.9 10*3/uL (ref 4.6–10.2)

## 2015-10-28 LAB — POCT SEDIMENTATION RATE: POCT SED RATE: 3 mm/h (ref 0–22)

## 2015-10-28 MED ORDER — TRAMADOL HCL 50 MG PO TABS: 50.0000 mg | ORAL_TABLET | Freq: Three times a day (TID) | ORAL | Status: DC | PRN

## 2015-10-28 NOTE — Progress Notes (Addendum)
Patient ID: Brandon Garza, male   DOB: 03-28-1976, 40 y.o.   MRN: [id number]    By signing my name below, I, Brandon Garza, attest that this documentation has been prepared under the direction and in the presence of [name], MD Electronically Signed: Ladene Artist, ED Scribe 10/28/2015 at 1:52 PM.  Chief Complaint:  Chief Complaint  Patient presents with  . Follow-up    back pain    HPI: Brandon Garza is a 40 y.o. male who reports to [facility] today for a follow-up regarding back pain. Pt was seen in the office on 10/23/15 for intermittent low back pain for several weeks, constant for 2-3 days at that point. MRI was ordered and pt was started on Ultram and Prednisone taper. Pt states that he has not received a call to set-up a MRI; he suspects that he has the wrong number on file. Today, he reports 7/10, low back pain that radiates into his right buttock with palpation and bending forward. Pt states that he has been complaint with medications, resting and out of work since he was last seen on 10/23/15.   Past Medical History  Diagnosis Date  . Hypertension   . Acid reflux   . Depression with anxiety   . 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 Bronchitis   . Allergy   . Arthritis   . Neuromuscular disorder ([place])   . Anxiety    Past Surgical History  Procedure Laterality Date  . Corneal transplant      left eye  . Tonsillectomy    . Nasal septum surgery    . Orthopedic left arm surgery      done following an accident  . Left hip surgery    . Dobutamine stress echo  10/12/2008    normal   Social History   Social History  . Marital Status: Single    Spouse Name: N/A  . Number of Children: N/A  . Years of Education: N/A   Social History Main Topics  . Smoking status: Former Smoker    Quit date: 07/22/2011  . Smokeless tobacco: Former Systems developer    Types: Snuff, Chew    Quit date: 07/22/2011  . Alcohol Use: 3.0 - 6.0 oz/week    6-12 drink(s) per week   Comment: social  . Drug Use: No  . Sexual Activity: Yes    Birth Control/ Protection: None   Other Topics Concern  . None   Social History Narrative   Family History  Problem Relation Age of Onset  . Heart Problems Father   . Stroke Maternal Grandfather   . Heart Problems Maternal Grandfather   . Mental illness Paternal Grandmother   . Heart Problems Paternal Grandfather    Allergies  Allergen Reactions  . Amoxicillin Hives   Prior to Admission medications   Medication Sig Start Date End Date Taking? Authorizing Provider  acetaminophen (TYLENOL) 500 MG tablet Take 500-1,000 mg by mouth every 6 (six) hours as needed for mild pain or headache. Reported on 06/16/2015   Yes Historical Provider, MD  albuterol (PROVENTIL HFA;VENTOLIN HFA) 108 (90 Base) MCG/ACT inhaler Inhale 2 puffs into the lungs every 6 (six) hours as needed for wheezing or shortness of breath (cough, shortness of breath or wheezing.). 07/05/15  Yes Posey Boyer, MD  ALPRAZolam Duanne Moron) 0.5 MG tablet Take 1 tablet (0.5 mg total) by mouth 3 (three) times daily as needed for anxiety. To follow current  prescription 07/05/15  Yes Posey Boyer, MD  beclomethasone (QVAR) 80 MCG/ACT inhaler Inhale 2 puffs into the lungs 2 (two) times daily. To use every single day as a prevention 07/05/15  Yes Posey Boyer, MD  buPROPion (WELLBUTRIN XL) 300 MG 24 hr tablet Take 1 tablet (300 mg total) by mouth daily. After 5 days take 2 tabs daily 06/24/15  Yes Leandrew Koyanagi, MD  colchicine 0.6 MG tablet Take 1 tablet (0.6 mg total) by mouth 2 (two) times daily. 05/25/15  Yes Leandrew Koyanagi, MD  metoprolol succinate (TOPROL-XL) 100 MG 24 hr tablet TAKE 1 TABLET (100 MG TOTAL) BY MOUTH DAILY. TAKE WITH OR IMMEDIATELY FOLLOWING A MEAL. 07/12/15  Yes Posey Boyer, MD  predniSONE (DELTASONE) 10 MG tablet Take 4 day for 3 days 3 a day for 3 days 2 a day for 3 days one a day for 3 days 10/23/15  Yes [name], MD  traMADol (ULTRAM) 50 MG  tablet Take 1 tablet (50 mg total) by mouth every 8 (eight) hours as needed. 10/23/15  Yes [name], MD   ROS: The patient denies fevers, chills, night sweats, unintentional weight loss, chest pain, palpitations, wheezing, dyspnea on exertion, nausea, vomiting, abdominal pain, dysuria, hematuria, melena, numbness, weakness, or tingling.   All other systems have been reviewed and were otherwise negative with the exception of those mentioned in the HPI and as above.    PHYSICAL EXAM: Filed Vitals:   10/28/15 1211  BP: 142/90  Pulse: 53  Temp: 98.6 F (37 C)  Resp: 17   Body mass index is 33.21 kg/(m^2).  General: Alert, no acute distress HEENT:  Normocephalic, atraumatic, oropharynx patent. Eye: Juliette Mangle [facility] Cardiovascular: Regular rate and rhythm, no rubs murmurs or gallops. No Carotid bruits, radial pulse intact. No pedal edema.  Respiratory: Clear to auscultation bilaterally. No wheezes, rales, or rhonchi. No cyanosis, no use of accessory musculature Abdominal: No organomegaly, abdomen is soft and non-tender, positive bowel sounds. No masses. Musculoskeletal: Gait intact. No edema. Tenderness over the lower back. Pain with flexion, extension and rotation. Straight leg raising was negative.  Skin: No rashes. Neurologic: Facial musculature symmetric. Reflexes are symmetrical. Psychiatric: Patient acts appropriately throughout our interaction. Lymphatic: No cervical or submandibular lymphadenopathy  LABS: Results for orders placed or performed in visit on 10/28/15  POCT CBC  Result Value Ref Range   WBC 7.9 4.6 - 10.2 K/uL   Lymph, poc 2.3 0.6 - 3.4   POC LYMPH PERCENT 29.2 10 - 50 %L   MID (cbc) 0.2 0 - 0.9   POC MID % 1.9 0 - 12 %M   POC Granulocyte 5.4 2 - 6.9   Granulocyte percent 68.9 37 - 80 %G   RBC 4.62 (A) 4.69 - 6.13 M/uL   Hemoglobin 14.8 14.1 - 18.1 g/dL   HCT, POC 41.8 (A) 43.5 - 53.7 %   MCV 90.4 80 - 97 fL   MCH, POC 32.0 (A) 27 - 31.2 pg   MCHC 35.4  31.8 - 35.4 g/dL   RDW, POC 13.0 %   Platelet Count, POC 191 142 - 424 K/uL   MPV 8.3 0 - 99.8 fL   EKG/XRAY:   Primary read interpreted by Dr. Everlene Farrier at [facility].  ASSESSMENT/PLAN: I will keep patient out of work. Advised to take 2 Aleve twice a day he can start on this after he finishes his prednisone taper.Marland Kitchen He was given Ultram for pain. Referral made to  orthopedics. Referrals also being done for an MRI. I did add a sedimentation rate and HLA typing.I personally performed the services described in this documentation, which was scribed in my presence. The recorded information has been reviewed and is accurate.    Gross sideeffects, risk and benefits, and alternatives of medications d/w patient. Patient is aware that all medications have potential sideeffects and we are unable to predict every sideeffect or drug-drug interaction that may occur.  Arlyss Queen MD 10/28/2015 1:01 PM

## 2015-10-28 NOTE — Patient Instructions (Addendum)
   IF you received an x-ray today, you will receive an invoice from [facility]. Please contact [facility] at [phone] with questions or concerns regarding your invoice.   IF you received labwork today, you will receive an invoice from [facility]. Please contact [facility] at [phone] with questions or concerns regarding your invoice.   Our billing staff will not be able to assist you with questions regarding bills from these companies.  You will be contacted with the lab results as soon as they are available. The fastest way to get your results is to activate your My Chart account. Instructions are located on the last page of this paperwork. If you have not heard from us regarding the results in 2 weeks, please contact this office.   Back Pain, Adult Back pain is very common in adults.The cause of back pain is rarely dangerous and the pain often gets better over time.The cause of your back pain may not be known. Some common causes of back pain include:  Strain of the muscles or ligaments supporting the spine.  Wear and tear (degeneration) of the spinal disks.  Arthritis.  Direct injury to the back. For many people, back pain may return. Since back pain is rarely dangerous, most people can learn to manage this condition on their own. HOME CARE INSTRUCTIONS Watch your back pain for any changes. The following actions may help to lessen any discomfort you are feeling:  Remain active. It is stressful on your back to sit or stand in one place for long periods of time. Do not sit, drive, or stand in one place for more than 30 minutes at a time. Take short walks on even surfaces as soon as you are able.Try to increase the length of time you walk each day.  Exercise regularly as directed by your health care provider. Exercise helps your back heal faster. It also helps avoid future injury by keeping your muscles strong and flexible.  Do not  stay in bed.Resting more than 1-2 days can delay your recovery.  Pay attention to your body when you bend and lift. The most comfortable positions are those that put less stress on your recovering back. Always use proper lifting techniques, including:  Bending your knees.  Keeping the load close to your body.  Avoiding twisting.  Find a comfortable position to sleep. Use a firm mattress and lie on your side with your knees slightly bent. If you lie on your back, put a pillow under your knees.  Avoid feeling anxious or stressed.Stress increases muscle tension and can worsen back pain.It is important to recognize when you are anxious or stressed and learn ways to manage it, such as with exercise.  Take medicines only as directed by your health care provider. Over-the-counter medicines to reduce pain and inflammation are often the most helpful.Your health care provider may prescribe muscle relaxant drugs.These medicines help dull your pain so you can more quickly return to your normal activities and healthy exercise.  Apply ice to the injured area:  Put ice in a plastic bag.  Place a towel between your skin and the bag.  Leave the ice on for 20 minutes, 2-3 times a day for the first 2-3 days. After that, ice and heat may be alternated to reduce pain and spasms.  Maintain a healthy weight. Excess weight puts extra stress on your back and makes it difficult to maintain good posture. SEEK MEDICAL CARE IF:  You have pain that   is not relieved with rest or medicine.  You have increasing pain going down into the legs or buttocks.  You have pain that does not improve in one week.  You have night pain.  You lose weight.  You have a fever or chills. SEEK IMMEDIATE MEDICAL CARE IF:   You develop new bowel or bladder control problems.  You have unusual weakness or numbness in your arms or legs.  You develop nausea or vomiting.  You develop abdominal pain.  You feel faint.    This information is not intended to replace advice given to you by your health care provider. Make sure you discuss any questions you have with your health care provider.   Document Released: 06/15/2005 Document Revised: 07/06/2014 Document Reviewed: 10/17/2013 Elsevier Interactive Patient Education 2016 Elsevier Inc.  

## 2015-10-31 ENCOUNTER — Telehealth: Payer: Self-pay

## 2015-10-31 NOTE — Telephone Encounter (Signed)
Patient needs FMLA forms completed by Dr Cleta Albertsaub and faxed back to [employer]. I have filled out what I could from his last OV on 10/28/15 and highlighted the areas that need to be completed. I will place them in you box on 10/31/15 if you could return them to the FMLA/Disability box at the [employer] checkout desk within 5-7 business days. Thank you!

## 2015-11-01 LAB — HLA-B27 ANTIGEN: DNA Result:: NEGATIVE

## 2015-11-05 ENCOUNTER — Other Ambulatory Visit: Payer: BLUE CROSS/BLUE SHIELD

## 2015-11-05 NOTE — Telephone Encounter (Signed)
Paperwork completed, scanned and faxed to [employer] as well as called the patient to come pick up his copy on 11/05/15

## 2015-11-08 ENCOUNTER — Ambulatory Visit
Admission: RE | Admit: 2015-11-08 | Discharge: 2015-11-08 | Disposition: A | Discharge status: Home / Self Care | Payer: BLUE CROSS/BLUE SHIELD | Source: Ambulatory Visit | Attending: Emergency Medicine | Admitting: Emergency Medicine

## 2015-11-08 DIAGNOSIS — M5136 Other intervertebral disc degeneration, lumbar region: Secondary | ICD-10-CM

## 2015-11-08 DIAGNOSIS — M549 Dorsalgia, unspecified: Secondary | ICD-10-CM

## 2015-11-13 DIAGNOSIS — Z0271 Encounter for disability determination: Secondary | ICD-10-CM

## 2015-11-26 ENCOUNTER — Ambulatory Visit (INDEPENDENT_AMBULATORY_CARE_PROVIDER_SITE_OTHER): Payer: BLUE CROSS/BLUE SHIELD | Admitting: Emergency Medicine

## 2015-11-26 VITALS — BP 142/84 | HR 68 | Temp 98.8°F | Resp 16 | Ht 71.0 in | Wt 240.0 lb

## 2015-11-26 DIAGNOSIS — M549 Dorsalgia, unspecified: Secondary | ICD-10-CM | POA: Diagnosis not present

## 2015-11-26 DIAGNOSIS — M5489 Other dorsalgia: Secondary | ICD-10-CM

## 2015-11-26 NOTE — Patient Instructions (Addendum)
   IF you received an x-ray today, you will receive an invoice from [facility]. Please contact [facility] at [phone] with questions or concerns regarding your invoice.   IF you received labwork today, you will receive an invoice from [facility]. Please contact [facility] at [phone] with questions or concerns regarding your invoice.   Our billing staff will not be able to assist you with questions regarding bills from these companies.  You will be contacted with the lab results as soon as they are available. The fastest way to get your results is to activate your My Chart account. Instructions are located on the last page of this paperwork. If you have not heard from us regarding the results in 2 weeks, please contact this office.   Back Pain, Adult Back pain is very common in adults.The cause of back pain is rarely dangerous and the pain often gets better over time.The cause of your back pain may not be known. Some common causes of back pain include:  Strain of the muscles or ligaments supporting the spine.  Wear and tear (degeneration) of the spinal disks.  Arthritis.  Direct injury to the back. For many people, back pain may return. Since back pain is rarely dangerous, most people can learn to manage this condition on their own. HOME CARE INSTRUCTIONS Watch your back pain for any changes. The following actions may help to lessen any discomfort you are feeling:  Remain active. It is stressful on your back to sit or stand in one place for long periods of time. Do not sit, drive, or stand in one place for more than 30 minutes at a time. Take short walks on even surfaces as soon as you are able.Try to increase the length of time you walk each day.  Exercise regularly as directed by your health care provider. Exercise helps your back heal faster. It also helps avoid future injury by keeping your muscles strong and flexible.  Do not  stay in bed.Resting more than 1-2 days can delay your recovery.  Pay attention to your body when you bend and lift. The most comfortable positions are those that put less stress on your recovering back. Always use proper lifting techniques, including:  Bending your knees.  Keeping the load close to your body.  Avoiding twisting.  Find a comfortable position to sleep. Use a firm mattress and lie on your side with your knees slightly bent. If you lie on your back, put a pillow under your knees.  Avoid feeling anxious or stressed.Stress increases muscle tension and can worsen back pain.It is important to recognize when you are anxious or stressed and learn ways to manage it, such as with exercise.  Take medicines only as directed by your health care provider. Over-the-counter medicines to reduce pain and inflammation are often the most helpful.Your health care provider may prescribe muscle relaxant drugs.These medicines help dull your pain so you can more quickly return to your normal activities and healthy exercise.  Apply ice to the injured area:  Put ice in a plastic bag.  Place a towel between your skin and the bag.  Leave the ice on for 20 minutes, 2-3 times a day for the first 2-3 days. After that, ice and heat may be alternated to reduce pain and spasms.  Maintain a healthy weight. Excess weight puts extra stress on your back and makes it difficult to maintain good posture. SEEK MEDICAL CARE IF:  You have pain that   is not relieved with rest or medicine.  You have increasing pain going down into the legs or buttocks.  You have pain that does not improve in one week.  You have night pain.  You lose weight.  You have a fever or chills. SEEK IMMEDIATE MEDICAL CARE IF:   You develop new bowel or bladder control problems.  You have unusual weakness or numbness in your arms or legs.  You develop nausea or vomiting.  You develop abdominal pain.  You feel faint.    This information is not intended to replace advice given to you by your health care provider. Make sure you discuss any questions you have with your health care provider.   Document Released: 06/15/2005 Document Revised: 07/06/2014 Document Reviewed: 10/17/2013 Elsevier Interactive Patient Education 2016 Elsevier Inc.  

## 2015-11-26 NOTE — Progress Notes (Signed)
By signing my name below, I, Stann Ore, attest that this documentation has been prepared under the direction and in the presence of Lesle Chris, MD. Electronically Signed: Stann Ore, Scribe. 11/26/2015 , 12:22 PM .  Patient was seen in room 1 .  Chief Complaint:  Chief Complaint  Patient presents with  . Follow-up    back pain and MRI results     HPI: Brandon Garza is a 40 y.o. male who reports to [facility] today for follow up on back pain and discussion of MRI results. Patient states that his back is still hurting at about the same level. 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 His MRI showed chronic slight degenerative disc disease at L1-2 without other significant abnormality; there were no changes since the prior study in 2010.   Patient reports having some pain radiating down to his hips occasionally but denies any pain down his lower extremities. He takes aleve every now and then.   Past Medical History  Diagnosis Date  . Hypertension   . Acid reflux   . Depression with anxiety   . 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 Bronchitis   . Allergy   . Arthritis   . Neuromuscular disorder ([facility])   . Anxiety    Past Surgical History  Procedure Laterality Date  . Corneal transplant      left eye  . Tonsillectomy    . Nasal septum surgery    . Orthopedic left arm surgery      done following an accident  . Left hip surgery    . Dobutamine stress echo  10/12/2008    normal   Social History   Social History  . Marital Status: Single    Spouse Name: N/A  . Number of Children: N/A  . Years of Education: N/A   Social History Main Topics  . Smoking status: Former Smoker    Quit date: 07/22/2011  . Smokeless tobacco: Former Neurosurgeon    Types: Snuff, Chew    Quit date: 07/22/2011  . Alcohol Use: 3.0 - 6.0 oz/week    6-12 drink(s) per week     Comment: social  . Drug Use: No  . Sexual Activity: Yes    Birth  Control/ Protection: None   Other Topics Concern  . None   Social History Narrative   Family History  Problem Relation Age of Onset  . Heart Problems Father   . Stroke Maternal Grandfather   . Heart Problems Maternal Grandfather   . Mental illness Paternal Grandmother   . Heart Problems Paternal Grandfather    Allergies  Allergen Reactions  . Amoxicillin Hives   Prior to Admission medications   Medication Sig Start Date End Date Taking? Authorizing Provider  acetaminophen (TYLENOL) 500 MG tablet Take 500-1,000 mg by mouth every 6 (six) hours as needed for mild pain or headache. Reported on 06/16/2015   Yes Historical Provider, MD  albuterol (PROVENTIL HFA;VENTOLIN HFA) 108 (90 Base) MCG/ACT inhaler Inhale 2 puffs into the lungs every 6 (six) hours as needed for wheezing or shortness of breath (cough, shortness of breath or wheezing.). 07/05/15  Yes Peyton Najjar, MD  ALPRAZolam Prudy Feeler) 0.5 MG tablet Take 1 tablet (0.5 mg total) by mouth 3 (three) times daily as needed for anxiety. To follow current prescription 07/05/15  Yes Peyton Najjar, MD  beclomethasone (QVAR) 80 MCG/ACT inhaler Inhale  2 puffs into the lungs 2 (two) times daily. To use every single day as a prevention 07/05/15  Yes Peyton Najjar, MD  buPROPion (WELLBUTRIN XL) 300 MG 24 hr tablet Take 1 tablet (300 mg total) by mouth daily. After 5 days take 2 tabs daily 06/24/15  Yes Tonye Pearson, MD  colchicine 0.6 MG tablet Take 1 tablet (0.6 mg total) by mouth 2 (two) times daily. 05/25/15  Yes Tonye Pearson, MD  metoprolol succinate (TOPROL-XL) 100 MG 24 hr tablet TAKE 1 TABLET (100 MG TOTAL) BY MOUTH DAILY. TAKE WITH OR IMMEDIATELY FOLLOWING A MEAL. 07/12/15  Yes Peyton Najjar, MD  predniSONE (DELTASONE) 10 MG tablet Take 4 day for 3 days 3 a day for 3 days 2 a day for 3 days one a day for 3 days 10/23/15  Yes Collene Gobble, MD  traMADol (ULTRAM) 50 MG tablet Take 1 tablet (50 mg total) by mouth every 8 (eight) hours as  needed. 10/28/15  Yes Collene Gobble, MD     ROS:  Constitutional: negative for fever, chills, night sweats, weight changes, or fatigue  HEENT: negative for vision changes, hearing loss, congestion, rhinorrhea, ST, epistaxis, or sinus pressure Cardiovascular: negative for chest pain or palpitations Respiratory: negative for hemoptysis, wheezing, shortness of breath, or cough Abdominal: negative for abdominal pain, nausea, vomiting, diarrhea, or constipation Musc: positive for back pain Dermatological: negative for rash Neurologic: negative for headache, dizziness, or syncope All other systems reviewed and are otherwise negative with the exception to those above and in the HPI.  PHYSICAL EXAM: Filed Vitals:   11/26/15 1037  BP: 142/84  Pulse: 68  Temp: 98.8 F (37.1 C)  Resp: 16   Body mass index is 33.49 kg/(m^2).   General: Alert, no acute distress; significant stuttering HEENT:  Normocephalic, atraumatic, oropharynx patent. Eye: [name] Cardiovascular:  Regular rate and rhythm, no rubs murmurs or gallops.  No Carotid bruits, radial pulse intact. No pedal edema.  Respiratory: Clear to auscultation bilaterally.  No wheezes, rales, or rhonchi.  No cyanosis, no use of accessory musculature Abdominal: No organomegaly, abdomen is soft and non-tender, positive bowel sounds. No masses. Musculoskeletal: Gait intact; mild tenderness over lower lumbar spine but good rom Skin: No rashes. Neurologic: Facial musculature symmetric; reflexes 2+ no weakness Psychiatric: Patient acts appropriately throughout our interaction.  Lymphatic: No cervical or submandibular lymphadenopathy Genitourinary/Anorectal: No acute findings BP rechecked in room, left arm (sitting): 124/80  LABS:   EKG/XRAY:   Mr Lumbar Spine Wo Contrast  11/08/2015  CLINICAL DATA:  Chronic low back pain. Recent onset of numbness in the left hip and leg. EXAM: MRI LUMBAR SPINE WITHOUT CONTRAST TECHNIQUE: Multiplanar,  multisequence MR imaging of the lumbar spine was performed. No intravenous contrast was administered. COMPARISON:  Radiographs dated 10/23/2015 and MRI dated 04/23/2009 FINDINGS: T11-12 and T12-L1: Normal conus tip at L1-2. Normal paraspinal soft tissues. T11-12 and T12-L1:  Normal. L1-2: Chronic disc desiccation. No disc bulging or protrusion. No change. L2-3 and L3-4:  Normal. L4-5: Normal disc. Slight degenerative changes of the right facet joint, stable. L5-S1:  Normal disc.  Normal facet joints. IMPRESSION: Chronic slight degenerative disc disease at L1-2. No other significant abnormality. No change since the prior study of 2010. Electronically Signed   By: Francene Boyers M.D.   On: 11/08/2015 11:16    ASSESSMENT/PLAN: Patient is minimal disc disease at L1-L2. He does not have any radicular symptoms. Referral made to Dr. Horald Chestnut for his  assistance in management of his chronic low back pain.   Gross sideeffects, risk and benefits, and alternatives of medications d/w patient. Patient is aware that all medications have potential sideeffects and we are unable to predict every sideeffect or drug-drug interaction that may occur.  Lesle ChrisSteven Daub MD 11/26/2015 I personally performed the services described in this documentation, which was scribed in my presence. The recorded information has been reviewed and is accurate.

## 2015-12-09 ENCOUNTER — Emergency Department (HOSPITAL_COMMUNITY): Payer: BLUE CROSS/BLUE SHIELD

## 2015-12-09 ENCOUNTER — Emergency Department (HOSPITAL_COMMUNITY)
Admission: EM | Admit: 2015-12-09 | Discharge: 2015-12-09 | Disposition: A | Discharge status: Home / Self Care | Payer: BLUE CROSS/BLUE SHIELD | Attending: Emergency Medicine | Admitting: Emergency Medicine

## 2015-12-09 ENCOUNTER — Encounter (HOSPITAL_COMMUNITY): Payer: Self-pay | Admitting: *Deleted

## 2015-12-09 DIAGNOSIS — R0602 Shortness of breath: Secondary | ICD-10-CM | POA: Insufficient documentation

## 2015-12-09 DIAGNOSIS — F329 Major depressive disorder, single episode, unspecified: Secondary | ICD-10-CM | POA: Insufficient documentation

## 2015-12-09 DIAGNOSIS — Z947 Corneal transplant status: Secondary | ICD-10-CM | POA: Insufficient documentation

## 2015-12-09 DIAGNOSIS — Z87891 Personal history of nicotine dependence: Secondary | ICD-10-CM | POA: Insufficient documentation

## 2015-12-09 DIAGNOSIS — J029 Acute pharyngitis, unspecified: Secondary | ICD-10-CM | POA: Diagnosis present

## 2015-12-09 DIAGNOSIS — J069 Acute upper respiratory infection, unspecified: Secondary | ICD-10-CM | POA: Insufficient documentation

## 2015-12-09 DIAGNOSIS — R059 Cough, unspecified: Secondary | ICD-10-CM

## 2015-12-09 DIAGNOSIS — M1990 Unspecified osteoarthritis, unspecified site: Secondary | ICD-10-CM | POA: Diagnosis not present

## 2015-12-09 DIAGNOSIS — I10 Essential (primary) hypertension: Secondary | ICD-10-CM | POA: Insufficient documentation

## 2015-12-09 DIAGNOSIS — R05 Cough: Secondary | ICD-10-CM

## 2015-12-09 DIAGNOSIS — Z79899 Other long term (current) drug therapy: Secondary | ICD-10-CM | POA: Insufficient documentation

## 2015-12-09 LAB — RAPID STREP SCREEN (MED CTR MEBANE ONLY): Streptococcus, Group A Screen (Direct): NEGATIVE

## 2015-12-09 MED ORDER — BENZONATATE 100 MG PO CAPS
100.0000 mg | ORAL_CAPSULE | Freq: Three times a day (TID) | ORAL | Status: DC
Start: 2015-12-09 — End: 2015-12-28

## 2015-12-09 NOTE — ED Provider Notes (Signed)
CSN: [id number]     Arrival date & time 12/09/15  0756 History   First MD Initiated Contact with Patient 12/09/15 0815     Chief Complaint  Patient presents with  . Shortness of Breath  . Sore Throat     (Consider location/radiation/quality/duration/timing/severity/associated sxs/prior Treatment) HPI Comments: Patient presents today with complaints of SOB, dry cough, and sore throat.  He reports onset of cough and SOB one week ago and onset of sore throat six days ago.  Symptoms gradually worsening.  He has taken Aleve and Tylenol for symptoms without relief.  He also reports associated congestion.  He denies fever, chills, chest pain, difficulty swallowing, sinus pain, or any other symptoms.  He states that he quit smoking 2-3 years ago.  No history of COPD or Asthma.    Patient is a 40 y.o. male presenting with shortness of breath and pharyngitis. The history is provided by the patient.  Shortness of Breath Sore Throat    Past Medical History  Diagnosis Date  . Hypertension   . Acid reflux   . Depression with anxiety   . 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 Bronchitis   . Allergy   . Arthritis   . Neuromuscular disorder (HCC)   . Anxiety    Past Surgical History  Procedure Laterality Date  . Corneal transplant      left eye  . Tonsillectomy    . Nasal septum surgery    . Orthopedic left arm surgery      done following an accident  . Left hip surgery    . Dobutamine stress echo  10/12/2008    normal   Family History  Problem Relation Age of Onset  . Heart Problems Father   . Stroke Maternal Grandfather   . Heart Problems Maternal Grandfather   . Mental illness Paternal Grandmother   . Heart Problems Paternal Grandfather    Social History  Substance Use Topics  . Smoking status: Former Smoker    Quit date: 07/22/2011  . Smokeless tobacco: Former NeurosurgeonUser    Types: Snuff, Chew    Quit date: 07/22/2011  . Alcohol Use: 3.0 - 6.0 oz/week    6-12  drink(s) per week     Comment: social    Review of Systems  Respiratory: Positive for shortness of breath.   All other systems reviewed and are negative.     Allergies  Amoxicillin  Home Medications   Prior to Admission medications   Medication Sig Start Date End Date Taking? Authorizing Provider  acetaminophen (TYLENOL) 500 MG tablet Take 500-1,000 mg by mouth every 6 (six) hours as needed for mild pain or headache. Reported on 06/16/2015    Historical Provider, MD  albuterol (PROVENTIL HFA;VENTOLIN HFA) 108 (90 Base) MCG/ACT inhaler Inhale 2 puffs into the lungs every 6 (six) hours as needed for wheezing or shortness of breath (cough, shortness of breath or wheezing.). 07/05/15   Peyton Najjaravid H Hopper, MD  ALPRAZolam Prudy Feeler(XANAX) 0.5 MG tablet Take 1 tablet (0.5 mg total) by mouth 3 (three) times daily as needed for anxiety. To follow current prescription 07/05/15   Peyton Najjaravid H Hopper, MD  beclomethasone (QVAR) 80 MCG/ACT inhaler Inhale 2 puffs into the lungs 2 (two) times daily. To use every single day as a prevention 07/05/15   Peyton Najjaravid H Hopper, MD  buPROPion (WELLBUTRIN XL) 300 MG 24 hr tablet Take 1 tablet (300 mg total) by mouth daily. After 5 days take  2 tabs daily 06/24/15   Tonye Pearson, MD  colchicine 0.6 MG tablet Take 1 tablet (0.6 mg total) by mouth 2 (two) times daily. 05/25/15   Tonye Pearson, MD  metoprolol succinate (TOPROL-XL) 100 MG 24 hr tablet TAKE 1 TABLET (100 MG TOTAL) BY MOUTH DAILY. TAKE WITH OR IMMEDIATELY FOLLOWING A MEAL. 07/12/15   Peyton Najjar, MD  predniSONE (DELTASONE) 10 MG tablet Take 4 day for 3 days 3 a day for 3 days 2 a day for 3 days one a day for 3 days 10/23/15   Collene Gobble, MD  traMADol (ULTRAM) 50 MG tablet Take 1 tablet (50 mg total) by mouth every 8 (eight) hours as needed. 10/28/15   Collene Gobble, MD   BP 155/87 mmHg  Pulse 86  Temp(Src) 97.7 F (36.5 C) (Oral)  Resp 18  Ht  (1.778 m)  Wt 106.595 kg  BMI 33.72 kg/m2  SpO2 95% Physical  Exam  Constitutional: He appears well-developed and well-nourished.  HENT:  Head: Normocephalic and atraumatic.  Mouth/Throat: Uvula is midline and oropharynx is clear and moist. No trismus in the jaw. No oropharyngeal exudate, posterior oropharyngeal edema or posterior oropharyngeal erythema.  Neck: Normal range of motion. Neck supple.  Cardiovascular: Normal rate, regular rhythm and normal heart sounds.   Pulmonary/Chest: Effort normal and breath sounds normal. No respiratory distress. He has no wheezes. He has no rales.  Musculoskeletal: Normal range of motion.  Lymphadenopathy:    He has no cervical adenopathy.  Neurological: He is alert.  Skin: Skin is warm and dry.  Psychiatric: He has a normal mood and affect.  Nursing note and vitals reviewed.   ED Course  Procedures (including critical care time) Labs Review Labs Reviewed  RAPID STREP SCREEN (NOT AT [facility])  CULTURE, GROUP A STREP [place])    Imaging Review Dg Chest 2 View  12/09/2015  CLINICAL DATA:  Cough, congestion shortness of breath for approximately 1 week. Initial encounter. EXAM: CHEST  2 VIEW COMPARISON:  PA and lateral chest 12/05/2013. FINDINGS: The lungs are clear. Heart size is normal. No pneumothorax or pleural effusion. No focal bony abnormality. IMPRESSION: No acute disease. Electronically Signed   By: Drusilla Kanner M.D.   On: 12/09/2015 09:08   I have personally reviewed and evaluated these images and lab results as part of my medical decision-making.   EKG Interpretation None      MDM   Final diagnoses:  Cough   Patient presents today with complaints of cough, congestion, and SOB for one week.  Lungs CTAB.  No hypoxia.  No chest pain.  CXR is negative.  Patient also complaining of sore throat x 6 days.  Rapid strep is negative.  No signs of PTA or Retropharyngeal Abscess on exam.  Feel that the patient is stable for discharge.  Symptoms most consistent with Viral URI.  Return precautions  given.    Santiago Glad, PA-C 12/09/15 1429  Arby Barrette, MD 12/15/15 2328

## 2015-12-09 NOTE — ED Notes (Signed)
Pt reports shortness of breath and sore throat x 1 week.  States he might have bronchitis.  Hx of this in the past.  Pt is a former smoker.

## 2015-12-11 LAB — CULTURE, GROUP A STREP (THRC)

## 2015-12-17 ENCOUNTER — Ambulatory Visit (INDEPENDENT_AMBULATORY_CARE_PROVIDER_SITE_OTHER): Payer: BLUE CROSS/BLUE SHIELD | Admitting: Internal Medicine

## 2015-12-17 VITALS — BP 120/78 | HR 88 | Temp 98.3°F | Resp 16 | Ht 70.0 in | Wt 233.0 lb

## 2015-12-17 DIAGNOSIS — J0101 Acute recurrent maxillary sinusitis: Secondary | ICD-10-CM | POA: Diagnosis not present

## 2015-12-17 DIAGNOSIS — J4520 Mild intermittent asthma, uncomplicated: Secondary | ICD-10-CM

## 2015-12-17 MED ORDER — AZITHROMYCIN 250 MG PO TABS: ORAL_TABLET | ORAL | Status: DC

## 2015-12-17 MED ORDER — HYDROCODONE-HOMATROPINE 5-1.5 MG/5ML PO SYRP: 5.0000 mL | ORAL_SOLUTION | Freq: Four times a day (QID) | ORAL | Status: DC | PRN

## 2015-12-17 MED ORDER — PREDNISONE 20 MG PO TABS: ORAL_TABLET | ORAL | Status: DC

## 2015-12-17 NOTE — Patient Instructions (Signed)
     IF you received an x-ray today, you will receive an invoice from [facility]. Please contact [facility] at [phone] with questions or concerns regarding your invoice.   IF you received labwork today, you will receive an invoice from [facility]. Please contact [facility] at [phone] with questions or concerns regarding your invoice.   Our billing staff will not be able to assist you with questions regarding bills from these companies.  You will be contacted with the lab results as soon as they are available. The fastest way to get your results is to activate your My Chart account. Instructions are located on the last page of this paperwork. If you have not heard from us regarding the results in 2 weeks, please contact this office.      

## 2015-12-17 NOTE — Progress Notes (Signed)
Subjective:  By signing my name below, I, Raven Small, attest that this documentation has been prepared under the direction and in the presence of Ellamae Siaobert Makenze Ellett, MD.  Electronically Signed: Andrew Auaven Small, ED Scribe. 12/17/2015. 11:50 AM.    Patient ID: Brandon Garza, male    DOB: 04/20/1976, [age] y.o.   MRN: [id number]  HPI Chief Complaint  Patient presents with  . URI  . Cough    HPI Comments: Brandon Garza is a 40 y.o. male who presents to the [facility] complaining of a cough . Pt states for the past 2-3 weeks he's had cough, sore throat, and nasal congestion with dark yellow discharge. He reports mild SOB with activity and has felt warm, but no measured fever. Pt does note trouble breathing through nose at night but has hx of a deviated septum.   Pt very recently had a baby and wants to make sure he is UTD on immunizations. TDAP 2014  Immunization History  Administered Date(s) Administered  . Influenza,inj,Quad PF,36+ Mos 07/05/2015  . Tdap 01/30/2013     Patient Active Problem List   Diagnosis Date Noted  . Viral URI with cough 06/28/2013  . GERD (gastroesophageal reflux disease) 10/20/2012  . HTN (hypertension) 02/12/2012  . Stuttering 02/12/2012  . GAD (generalized anxiety disorder) 02/12/2012  . Nicotine use disorder 02/12/2012  . Chronic back pain 02/12/2012  . Hip dislocation, left (HCC) 02/12/2012  . Gout 08/04/2011   Past Medical History  Diagnosis Date  . Hypertension   . Acid reflux   . Depression with anxiety   . 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 Bronchitis   . Allergy   . Arthritis   . Neuromuscular disorder (HCC)   . Anxiety    Past Surgical History  Procedure Laterality Date  . Corneal transplant      left eye  . Tonsillectomy    . Nasal septum surgery    . Orthopedic left arm surgery      done following an accident  . Left hip surgery    . Dobutamine stress echo  10/12/2008    normal   Allergies    Allergen Reactions  . Amoxicillin Hives   Prior to Admission medications   Medication Sig Start Date End Date Taking? Authorizing Provider  acetaminophen (TYLENOL) 500 MG tablet Take 500-1,000 mg by mouth every 6 (six) hours as needed for mild pain or headache. Reported on 06/16/2015   Yes Historical Provider, MD  albuterol (PROVENTIL HFA;VENTOLIN HFA) 108 (90 Base) MCG/ACT inhaler Inhale 2 puffs into the lungs every 6 (six) hours as needed for wheezing or shortness of breath (cough, shortness of breath or wheezing.). 07/05/15  Yes Peyton Najjaravid H Hopper, MD  ALPRAZolam Prudy Feeler(XANAX) 0.5 MG tablet Take 1 tablet (0.5 mg total) by mouth 3 (three) times daily as needed for anxiety. To follow current prescription 07/05/15  Yes Peyton Najjaravid H Hopper, MD  beclomethasone (QVAR) 80 MCG/ACT inhaler Inhale 2 puffs into the lungs 2 (two) times daily. To use every single day as a prevention 07/05/15  Yes Peyton Najjaravid H Hopper, MD  benzonatate (TESSALON) 100 MG capsule Take 1 capsule (100 mg total) by mouth every 8 (eight) hours. 12/09/15  Yes Heather Laisure, PA-C  colchicine 0.6 MG tablet Take 1 tablet (0.6 mg total) by mouth 2 (two) times daily. 05/25/15  Yes Tonye Pearsonobert P Annastacia Duba, MD  metoprolol succinate (TOPROL-XL) 100 MG 24 hr tablet TAKE 1 TABLET (  100 MG TOTAL) BY MOUTH DAILY. TAKE WITH OR IMMEDIATELY FOLLOWING A MEAL. 07/12/15  Yes Peyton Najjar, MD  buPROPion (WELLBUTRIN XL) 300 MG 24 hr tablet Take 1 tablet (300 mg total) by mouth daily. After 5 days take 2 tabs daily Patient not taking: Reported on 12/17/2015 06/24/15   Tonye Pearson, MD  predniSONE (DELTASONE) 10 MG tablet Take 4 day for 3 days 3 a day for 3 days 2 a day for 3 days one a day for 3 days Patient not taking: Reported on 12/17/2015 10/23/15   Collene Gobble, MD  traMADol (ULTRAM) 50 MG tablet Take 1 tablet (50 mg total) by mouth every 8 (eight) hours as needed. Patient not taking: Reported on 12/17/2015 10/28/15   Collene Gobble, MD    Review of Systems  Constitutional:  Negative for fever and chills.  HENT: Positive for congestion and sore throat.   Respiratory: Positive for cough.     Objective:   Physical Exam  Constitutional: He is oriented to person, place, and time. He appears well-developed and well-nourished. No distress.  HENT:  Head: Normocephalic and atraumatic.  Eyes: Conjunctivae and EOM are normal.  Neck: Neck supple.  Cardiovascular: Normal rate, regular rhythm and normal heart sounds.   No murmur heard. Pulmonary/Chest: Effort normal. He has wheezes ( mild wheezing on force expiration.). He has no rales.  Musculoskeletal: Normal range of motion.  Neurological: He is alert and oriented to person, place, and time.  Skin: Skin is warm and dry.  Psychiatric: He has a normal mood and affect. His behavior is normal.  Nursing note and vitals reviewed.  Filed Vitals:   12/17/15 1112  BP: 120/78  Pulse: 88  Temp: 98.3 F (36.8 C)  TempSrc: Oral  Resp: 16  Height:  (1.778 m)  Weight: 233 lb (105.688 kg)  SpO2: 96%    Assessment & Plan:  Reactive airway disease, mild intermittent, uncomplicated  Acute recurrent maxillary sinusitis    Meds ordered this encounter  Medications  . azithromycin (ZITHROMAX) 250 MG tablet    Sig: As packaged    Dispense:  6 tablet    Refill:  0  . HYDROcodone-homatropine (HYCODAN) 5-1.5 MG/5ML syrup    Sig: Take 5 mLs by mouth every 6 (six) hours as needed.    Dispense:  120 mL    Refill:  0  . predniSONE (DELTASONE) 20 MG tablet    Sig: 3/3/2/2/1/1 single daily dose for 6 days    Dispense:  12 tablet    Refill:  0    I have completed the patient encounter in its entirety as documented by the scribe, with editing by me where necessary. Leray Garverick P. Merla Riches, M.D.

## 2015-12-28 ENCOUNTER — Ambulatory Visit (INDEPENDENT_AMBULATORY_CARE_PROVIDER_SITE_OTHER): Payer: BLUE CROSS/BLUE SHIELD | Admitting: Emergency Medicine

## 2015-12-28 VITALS — BP 130/80 | HR 84 | Temp 98.0°F | Resp 18 | Ht 70.0 in | Wt 235.5 lb

## 2015-12-28 DIAGNOSIS — M1000 Idiopathic gout, unspecified site: Secondary | ICD-10-CM

## 2015-12-28 DIAGNOSIS — I10 Essential (primary) hypertension: Secondary | ICD-10-CM | POA: Diagnosis not present

## 2015-12-28 DIAGNOSIS — F411 Generalized anxiety disorder: Secondary | ICD-10-CM

## 2015-12-28 DIAGNOSIS — F8081 Childhood onset fluency disorder: Secondary | ICD-10-CM | POA: Diagnosis not present

## 2015-12-28 DIAGNOSIS — M549 Dorsalgia, unspecified: Secondary | ICD-10-CM

## 2015-12-28 LAB — POCT CBC
Granulocyte percent: 68.2 %G (ref 37–80)
HCT, POC: 43.7 % (ref 43.5–53.7)
HEMOGLOBIN: 15.7 g/dL (ref 14.1–18.1)
LYMPH, POC: 1.8 (ref 0.6–3.4)
MCH, POC: 32.9 pg — AB (ref 27–31.2)
MCHC: 35.8 g/dL — AB (ref 31.8–35.4)
MCV: 91.7 fL (ref 80–97)
MID (cbc): 0.5 (ref 0–0.9)
MPV: 8.4 fL (ref 0–99.8)
PLATELET COUNT, POC: 170 10*3/uL (ref 142–424)
POC GRANULOCYTE: 5 (ref 2–6.9)
POC LYMPH %: 24.6 % (ref 10–50)
POC MID %: 7.2 %M (ref 0–12)
RBC: 4.77 M/uL (ref 4.69–6.13)
RDW, POC: 12.9 %
WBC: 7.3 10*3/uL (ref 4.6–10.2)

## 2015-12-28 LAB — BASIC METABOLIC PANEL WITH GFR
BUN: 17 mg/dL (ref 7–25)
CHLORIDE: 106 mmol/L (ref 98–110)
CO2: 26 mmol/L (ref 20–31)
CREATININE: 0.81 mg/dL (ref 0.60–1.35)
Calcium: 9.3 mg/dL (ref 8.6–10.3)
GFR, Est African American: >89 mL/min (ref >=60)
Glucose, Bld: 71 mg/dL (ref 65–99)
Potassium: 4.8 mmol/L (ref 3.5–5.3)
SODIUM: 141 mmol/L (ref 135–146)

## 2015-12-28 LAB — TSH: TSH: 1.11 m[IU]/L (ref 0.40–4.50)

## 2015-12-28 LAB — URIC ACID: URIC ACID, SERUM: 10.2 mg/dL — AB (ref 4.0–8.0)

## 2015-12-28 MED ORDER — ALLOPURINOL 100 MG PO TABS: 100.0000 mg | ORAL_TABLET | Freq: Every day | ORAL | Status: DC

## 2015-12-28 MED ORDER — ALPRAZOLAM 0.5 MG PO TABS: 0.5000 mg | ORAL_TABLET | Freq: Three times a day (TID) | ORAL | Status: DC | PRN

## 2015-12-28 MED ORDER — COLCHICINE 0.6 MG PO TABS: 0.6000 mg | ORAL_TABLET | Freq: Two times a day (BID) | ORAL | Status: DC

## 2015-12-28 NOTE — Patient Instructions (Addendum)
   IF you received an x-ray today, you will receive an invoice from [facility]. Please contact [facility] at [phone] with questions or concerns regarding your invoice.   IF you received labwork today, you will receive an invoice from [facility]. Please contact [facility] at [phone] with questions or concerns regarding your invoice.   Our billing staff will not be able to assist you with questions regarding bills from these companies.  You will be contacted with the lab results as soon as they are available. The fastest way to get your results is to activate your My Chart account. Instructions are located on the last page of this paperwork. If you have not heard from us regarding the results in 2 weeks, please contact this office.     Gout Gout is an inflammatory arthritis caused by a buildup of uric acid crystals in the joints. Uric acid is a chemical that is normally present in the blood. When the level of uric acid in the blood is too high it can form crystals that deposit in your joints and tissues. This causes joint redness, soreness, and swelling (inflammation). Repeat attacks are common. Over time, uric acid crystals can form into masses (tophi) near a joint, destroying bone and causing disfigurement. Gout is treatable and often preventable. CAUSES  The disease begins with elevated levels of uric acid in the blood. Uric acid is produced by your body when it breaks down a naturally found substance called purines. Certain foods you eat, such as meats and fish, contain high amounts of purines. Causes of an elevated uric acid level include:  Being passed down from parent to child (heredity).  Diseases that cause increased uric acid production (such as obesity, psoriasis, and certain cancers).  Excessive alcohol use.  Diet, especially diets rich in meat and seafood.  Medicines, including certain cancer-fighting medicines  (chemotherapy), water pills (diuretics), and aspirin.  Chronic kidney disease. The kidneys are no longer able to remove uric acid well.  Problems with metabolism. Conditions strongly associated with gout include:  Obesity.  High blood pressure.  High cholesterol.  Diabetes. Not everyone with elevated uric acid levels gets gout. It is not understood why some people get gout and others do not. Surgery, joint injury, and eating too much of certain foods are some of the factors that can lead to gout attacks. SYMPTOMS   An attack of gout comes on quickly. It causes intense pain with redness, swelling, and warmth in a joint.  Fever can occur.  Often, only one joint is involved. Certain joints are more commonly involved:  Base of the big toe.  Knee.  Ankle.  Wrist.  Finger. Without treatment, an attack usually goes away in a few days to weeks. Between attacks, you usually will not have symptoms, which is different from many other forms of arthritis. DIAGNOSIS  Your caregiver will suspect gout based on your symptoms and exam. In some cases, tests may be recommended. The tests may include:  Blood tests.  Urine tests.  X-rays.  Joint fluid exam. This exam requires a needle to remove fluid from the joint (arthrocentesis). Using a microscope, gout is confirmed when uric acid crystals are seen in the joint fluid. TREATMENT  There are two phases to gout treatment: treating the sudden onset (acute) attack and preventing attacks (prophylaxis).  Treatment of an Acute Attack.  Medicines are used. These include anti-inflammatory medicines or steroid medicines.  An injection of steroid medicine into the affected   joint is sometimes necessary.  The painful joint is rested. Movement can worsen the arthritis.  You may use warm or cold treatments on painful joints, depending which works best for you.  Treatment to Prevent Attacks.  If you suffer from frequent gout attacks, your  caregiver may advise preventive medicine. These medicines are started after the acute attack subsides. These medicines either help your kidneys eliminate uric acid from your body or decrease your uric acid production. You may need to stay on these medicines for a very long time.  The early phase of treatment with preventive medicine can be associated with an increase in acute gout attacks. For this reason, during the first few months of treatment, your caregiver may also advise you to take medicines usually used for acute gout treatment. Be sure you understand your caregiver's directions. Your caregiver may make several adjustments to your medicine dose before these medicines are effective.  Discuss dietary treatment with your caregiver or dietitian. Alcohol and drinks high in sugar and fructose and foods such as meat, poultry, and seafood can increase uric acid levels. Your caregiver or dietitian can advise you on drinks and foods that should be limited. HOME CARE INSTRUCTIONS   Do not take aspirin to relieve pain. This raises uric acid levels.  Only take over-the-counter or prescription medicines for pain, discomfort, or fever as directed by your caregiver.  Rest the joint as much as possible. When in bed, keep sheets and blankets off painful areas.  Keep the affected joint raised (elevated).  Apply warm or cold treatments to painful joints. Use of warm or cold treatments depends on which works best for you.  Use crutches if the painful joint is in your leg.  Drink enough fluids to keep your urine clear or pale yellow. This helps your body get rid of uric acid. Limit alcohol, sugary drinks, and fructose drinks.  Follow your dietary instructions. Pay careful attention to the amount of protein you eat. Your daily diet should emphasize fruits, vegetables, whole grains, and fat-free or low-fat milk products. Discuss the use of coffee, vitamin C, and cherries with your caregiver or dietitian. These  may be helpful in lowering uric acid levels.  Maintain a healthy body weight. SEEK MEDICAL CARE IF:   You develop diarrhea, vomiting, or any side effects from medicines.  You do not feel better in 24 hours, or you are getting worse. SEEK IMMEDIATE MEDICAL CARE IF:   Your joint becomes suddenly more tender, and you have chills or a fever. MAKE SURE YOU:   Understand these instructions.  Will watch your condition.  Will get help right away if you are not doing well or get worse.   This information is not intended to replace advice given to you by your health care provider. Make sure you discuss any questions you have with your health care provider.   Document Released: 06/12/2000 Document Revised: 07/06/2014 Document Reviewed: 01/27/2012 Elsevier Interactive Patient Education 2016 [employer]  

## 2015-12-28 NOTE — Progress Notes (Signed)
By signing my name below, I, Brandon Garza, attest that this documentation has been prepared under the direction and in the presence of [name], MD. Electronically Signed: [name], ER Scribe. 12/28/2015. 9:06 AM.  Chief Complaint:  Chief Complaint  Patient presents with  . Medication Refill    HPI: Brandon Garza is a 40 y.o. male who [facility] today reporting for a medication refill. He states he is doing well. He states he does his back exercises intermittently. He states he takes his colchicine whenever he feels a gout flare up coming on. He has a flare every other month roughly. He states he has been told in the past that he gets gout because he drinks too much mountain dew. He drinks one six pack of beer per week. He takes 1-2 alprazolam during the day, and then takes 1-2 before bed so he can sleep. He quit smoking a few months ago. He does not recall if he has ever taken allopurinol. He is not taking wellbutrin. His last uric acid test occurred one year ago, and was 7.5.   Past Medical History  Diagnosis Date  . Hypertension   . Acid reflux   . Depression with anxiety   . 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 Bronchitis   . Allergy   . Arthritis   . Neuromuscular disorder (HCC)   . Anxiety    Past Surgical History  Procedure Laterality Date  . Corneal transplant      left eye  . Tonsillectomy    . Nasal septum surgery    . Orthopedic left arm surgery      done following an accident  . Left hip surgery    . Dobutamine stress echo  10/12/2008    normal   Social History   Social History  . Marital Status: Single    Spouse Name: N/A  . Number of Children: N/A  . Years of Education: N/A   Social History Main Topics  . Smoking status: Former Smoker    Quit date: 07/22/2011  . Smokeless tobacco: Former NeurosurgeonUser    Types: Snuff, Chew    Quit date: 07/22/2011  . Alcohol Use: 3.0 - 6.0 oz/week    6-12 drink(s) per week     Comment:  social  . Drug Use: No  . Sexual Activity: Yes    Birth Control/ Protection: None   Other Topics Concern  . None   Social History Narrative   Family History  Problem Relation Age of Onset  . Heart Problems Father   . Stroke Maternal Grandfather   . Heart Problems Maternal Grandfather   . Mental illness Paternal Grandmother   . Heart Problems Paternal Grandfather    Allergies  Allergen Reactions  . Amoxicillin Hives   Prior to Admission medications   Medication Sig Start Date End Date Taking? Authorizing Provider  acetaminophen (TYLENOL) 500 MG tablet Take 500-1,000 mg by mouth every 6 (six) hours as needed for mild pain or headache. Reported on 06/16/2015   Yes Historical Provider, MD  albuterol (PROVENTIL HFA;VENTOLIN HFA) 108 (90 Base) MCG/ACT inhaler Inhale 2 puffs into the lungs every 6 (six) hours as needed for wheezing or shortness of breath (cough, shortness of breath or wheezing.). 07/05/15  Yes Peyton Najjaravid H Hopper, MD  ALPRAZolam Prudy Feeler(XANAX) 0.5 MG tablet Take 1 tablet (0.5 mg total) by mouth 3 (three) times daily as needed for anxiety. To follow current  prescription 07/05/15  Yes Peyton Najjaravid H Hopper, MD  beclomethasone (QVAR) 80 MCG/ACT inhaler Inhale 2 puffs into the lungs 2 (two) times daily. To use every single day as a prevention 07/05/15  Yes Peyton Najjaravid H Hopper, MD  buPROPion (WELLBUTRIN XL) 300 MG 24 hr tablet Take 1 tablet (300 mg total) by mouth daily. After 5 days take 2 tabs daily 06/24/15  Yes Tonye Pearsonobert P Doolittle, MD  colchicine 0.6 MG tablet Take 1 tablet (0.6 mg total) by mouth 2 (two) times daily. 05/25/15  Yes Tonye Pearsonobert P Doolittle, MD  metoprolol succinate (TOPROL-XL) 100 MG 24 hr tablet TAKE 1 TABLET (100 MG TOTAL) BY MOUTH DAILY. TAKE WITH OR IMMEDIATELY FOLLOWING A MEAL. 07/12/15  Yes Peyton Najjaravid H Hopper, MD  traMADol (ULTRAM) 50 MG tablet Take 1 tablet (50 mg total) by mouth every 8 (eight) hours as needed. 10/28/15  Yes Collene GobbleSteven A Ethelmae Ringel, MD     ROS: The patient denies fevers, chills, night  sweats, unintentional weight loss, chest pain, palpitations, wheezing, dyspnea on exertion, nausea, vomiting, abdominal pain, dysuria, hematuria, melena, numbness, weakness, or tingling.   All other systems have been reviewed and were otherwise negative with the exception of those mentioned in the HPI and as above.    PHYSICAL EXAM: Filed Vitals:   12/28/15 0841  BP: 130/80  Pulse: 84  Temp: 98 F (36.7 C)  Resp: 18   Body mass index is 33.79 kg/(m^2).   General: Alert, no acute distress. Somewhat flushed in the face. HEENT:  Normocephalic, atraumatic, oropharynx patent. [facility] Cardiovascular:  Regular rate and rhythm, no rubs murmurs or gallops.  No Carotid bruits, radial pulse intact. No pedal edema.  Respiratory: Clear to auscultation bilaterally.  No wheezes, rales, or rhonchi.  No cyanosis, no use of accessory musculature Abdominal: No organomegaly, abdomen is soft and non-tender, positive bowel sounds.  No masses. Musculoskeletal: Gait intact. No edema, tenderness Skin: No rashes. Neurologic: Facial musculature symmetric. He has a stutter. Psychiatric: Patient acts appropriately throughout our interaction. Lymphatic: No cervical or submandibular lymphadenopathy   LABS: Results for orders placed or performed in visit on 12/28/15  POCT CBC  Result Value Ref Range   WBC 7.3 4.6 - 10.2 K/uL   Lymph, poc 1.8 0.6 - 3.4   POC LYMPH PERCENT 24.6 10 - 50 %L   MID (cbc) 0.5 0 - 0.9   POC MID % 7.2 0 - 12 %M   POC Granulocyte 5.0 2 - 6.9   Granulocyte percent 68.2 37 - 80 %G   RBC 4.77 4.69 - 6.13 M/uL   Hemoglobin 15.7 14.1 - 18.1 g/dL   HCT, POC 16.143.7 09.643.5 - 53.7 %   MCV 91.7 80 - 97 fL   MCH, POC 32.9 (A) 27 - 31.2 pg   MCHC 35.8 (A) 31.8 - 35.4 g/dL   RDW, POC 04.512.9 %   Platelet Count, POC 170 142 - 424 K/uL   MPV 8.4 0 - 99.8 fL   Meds ordered this encounter  Medications  . ALPRAZolam (XANAX) 0.5 MG tablet    Sig: Take 1 tablet (0.5 mg total) by mouth 3  (three) times daily as needed for anxiety. To follow current prescription    Dispense:  90 tablet    Refill:  5  . colchicine 0.6 MG tablet    Sig: Take 1 tablet (0.6 mg total) by mouth 2 (two) times daily.    Dispense:  60 tablet    Refill:  1  .  allopurinol (ZYLOPRIM) 100 MG tablet    Sig: Take 1 tablet (100 mg total) by mouth daily.    Dispense:  30 tablet    Refill:  11     EKG/XRAY:   Primary read interpreted by Dr. Cleta Alberts at [facility].   ASSESSMENT/PLAN: Maryclare Labrador go ahead and start patient on allopurinol. I did refill his Xanax. He does have anxiety he feels this related a lot to stuttering. His colchicine was also refilled. He has an episode every 1-2 months.I personally performed the services described in this documentation, which was scribed in my presence. The recorded information has been reviewed and is accurate.   Gross sideeffects, risk and benefits, and alternatives of medications d/w patient. Patient is aware that all medications have potential sideeffects and we are unable to predict every sideeffect or drug-drug interaction that may occur.  Lesle Chris MD 12/28/2015 9:06 AM

## 2016-01-10 ENCOUNTER — Telehealth: Payer: Self-pay

## 2016-01-10 NOTE — Telephone Encounter (Signed)
-----   Message from Collene GobbleSteven A Daub, MD sent at 12/28/2015  5:39 PM EDT ----- Uric acid is really high.  Start  allopurinol recheck in one month repeat your uric acid and increase the dose as tolerated.

## 2016-01-10 NOTE — Telephone Encounter (Signed)
Unable to reach pt - mailbox not set up Unable to reach mother or sister - mailbox full Letter sent with results per Dr. Cleta Albertsaub

## 2016-02-03 ENCOUNTER — Encounter: Payer: Self-pay | Admitting: Physician Assistant

## 2016-02-03 ENCOUNTER — Ambulatory Visit (INDEPENDENT_AMBULATORY_CARE_PROVIDER_SITE_OTHER): Payer: BLUE CROSS/BLUE SHIELD

## 2016-02-03 ENCOUNTER — Ambulatory Visit (INDEPENDENT_AMBULATORY_CARE_PROVIDER_SITE_OTHER): Payer: BLUE CROSS/BLUE SHIELD | Admitting: Physician Assistant

## 2016-02-03 VITALS — BP 130/88 | HR 75 | Temp 98.2°F | Resp 17 | Ht 70.0 in | Wt 240.0 lb

## 2016-02-03 DIAGNOSIS — M25571 Pain in right ankle and joints of right foot: Secondary | ICD-10-CM

## 2016-02-03 DIAGNOSIS — M109 Gout, unspecified: Secondary | ICD-10-CM | POA: Diagnosis not present

## 2016-02-03 MED ORDER — HYDROCODONE-ACETAMINOPHEN 5-325 MG PO TABS: 1.0000 | ORAL_TABLET | Freq: Four times a day (QID) | ORAL | 0 refills | Status: DC | PRN

## 2016-02-03 MED ORDER — PREDNISONE 20 MG PO TABS: ORAL_TABLET | ORAL | 0 refills | Status: AC

## 2016-02-03 NOTE — Patient Instructions (Addendum)
IF you received an x-ray today, you will receive an invoice from [facility]. Please contact [facility] at [phone] with questions or concerns regarding your invoice.   IF you received labwork today, you will receive an invoice from [facility]. Please contact [facility] at [phone] with questions or concerns regarding your invoice.   Our billing staff will not be able to assist you with questions regarding bills from these companies.  You will be contacted with the lab results as soon as they are available. The fastest way to get your results is to activate your My Chart account. Instructions are located on the last page of this paperwork. If you have not heard from us regarding the results in 2 weeks, please contact this office.    Try ice or heat 3 times per day.  Rest the ankle, and attempt to do slight movements of the ankle (rolling, flexing the toes as we did at the visit).  Take medication and prednisone as prescribed. Please watch your diet.  Gout can flare up from the over indulgence of any dietary thing.  Make sure you are hydrating well with water, with at least 64 oz or more of water per day.   Gout Gout is an inflammatory arthritis caused by a buildup of uric acid crystals in the joints. Uric acid is a chemical that is normally present in the blood. When the level of uric acid in the blood is too high it can form crystals that deposit in your joints and tissues. This causes joint redness, soreness, and swelling (inflammation). Repeat attacks are common. Over time, uric acid crystals can form into masses (tophi) near a joint, destroying bone and causing disfigurement. Gout is treatable and often preventable. CAUSES  The disease begins with elevated levels of uric acid in the blood. Uric acid is produced by your body when it breaks down a naturally found substance called purines. Certain foods you eat, such as meats and fish,  contain high amounts of purines. Causes of an elevated uric acid level include:  Being passed down from parent to child (heredity).  Diseases that cause increased uric acid production (such as obesity, psoriasis, and certain cancers).  Excessive alcohol use.  Diet, especially diets rich in meat and seafood.  Medicines, including certain cancer-fighting medicines (chemotherapy), water pills (diuretics), and aspirin.  Chronic kidney disease. The kidneys are no longer able to remove uric acid well.  Problems with metabolism. Conditions strongly associated with gout include:  Obesity.  High blood pressure.  High cholesterol.  Diabetes. Not everyone with elevated uric acid levels gets gout. It is not understood why some people get gout and others do not. Surgery, joint injury, and eating too much of certain foods are some of the factors that can lead to gout attacks. SYMPTOMS   An attack of gout comes on quickly. It causes intense pain with redness, swelling, and warmth in a joint.  Fever can occur.  Often, only one joint is involved. Certain joints are more commonly involved:  Base of the big toe.  Knee.  Ankle.  Wrist.  Finger. Without treatment, an attack usually goes away in a few days to weeks. Between attacks, you usually will not have symptoms, which is different from many other forms of arthritis. DIAGNOSIS  Your caregiver will suspect gout based on your symptoms and exam. In some cases, tests may be recommended. The tests may include:  Blood tests.  Urine tests.  X-rays.  Joint fluid exam. This exam requires a needle to remove fluid from the joint (arthrocentesis). Using a microscope, gout is confirmed when uric acid crystals are seen in the joint fluid. TREATMENT  There are two phases to gout treatment: treating the sudden onset (acute) attack and preventing attacks (prophylaxis).  Treatment of an Acute Attack.  Medicines are used. These include  anti-inflammatory medicines or steroid medicines.  An injection of steroid medicine into the affected joint is sometimes necessary.  The painful joint is rested. Movement can worsen the arthritis.  You may use warm or cold treatments on painful joints, depending which works best for you.  Treatment to Prevent Attacks.  If you suffer from frequent gout attacks, your caregiver may advise preventive medicine. These medicines are started after the acute attack subsides. These medicines either help your kidneys eliminate uric acid from your body or decrease your uric acid production. You may need to stay on these medicines for a very long time.  The early phase of treatment with preventive medicine can be associated with an increase in acute gout attacks. For this reason, during the first few months of treatment, your caregiver may also advise you to take medicines usually used for acute gout treatment. Be sure you understand your caregiver's directions. Your caregiver may make several adjustments to your medicine dose before these medicines are effective.  Discuss dietary treatment with your caregiver or dietitian. Alcohol and drinks high in sugar and fructose and foods such as meat, poultry, and seafood can increase uric acid levels. Your caregiver or dietitian can advise you on drinks and foods that should be limited. HOME CARE INSTRUCTIONS   Do not take aspirin to relieve pain. This raises uric acid levels.  Only take over-the-counter or prescription medicines for pain, discomfort, or fever as directed by your caregiver.  Rest the joint as much as possible. When in bed, keep sheets and blankets off painful areas.  Keep the affected joint raised (elevated).  Apply warm or cold treatments to painful joints. Use of warm or cold treatments depends on which works best for you.  Use crutches if the painful joint is in your leg.  Drink enough fluids to keep your urine clear or pale yellow. This  helps your body get rid of uric acid. Limit alcohol, sugary drinks, and fructose drinks.  Follow your dietary instructions. Pay careful attention to the amount of protein you eat. Your daily diet should emphasize fruits, vegetables, whole grains, and fat-free or low-fat milk products. Discuss the use of coffee, vitamin C, and cherries with your caregiver or dietitian. These may be helpful in lowering uric acid levels.  Maintain a healthy body weight. SEEK MEDICAL CARE IF:   You develop diarrhea, vomiting, or any side effects from medicines.  You do not feel better in 24 hours, or you are getting worse. SEEK IMMEDIATE MEDICAL CARE IF:   Your joint becomes suddenly more tender, and you have chills or a fever. MAKE SURE YOU:   Understand these instructions.  Will watch your condition.  Will get help right away if you are not doing well or get worse.   This information is not intended to replace advice given to you by your health care provider. Make sure you discuss any questions you have with your health care provider.   Document Released: 06/12/2000 Document Revised: 07/06/2014 Document Reviewed: 01/27/2012 Elsevier Interactive Patient Education [employer]

## 2016-02-03 NOTE — Progress Notes (Signed)
Patient ID: Brandon Garza, male   DOB: May 18, 1976, 40 y.o.   MRN: [id number] [facility] [address] [place] [address] [phone]  By signing my name below I, Brandon Garza, attest that this documentation has been prepared under the direction and in the presence of Trena Platt PA. Electonically Signed. Jonathandavid Marlett, Scribe 02/03/2016 at 9:46 AM  Date:  02/03/2016   Name:  Brandon Garza   DOB:  19-Jan-1976   MRN:  [id number]  PCP:  Nilda Simmer, MD    History of Present Illness:  Brandon Garza is a 40 y.o. male patient who presents to [facility] c/o rt foot pain that has been worsening since onset 3 days ago. Pt also reports swelling around rt ankle. Pt denies any trauma or injury to ankle or foot. Pain has become so painful pt has not been able to walk on it today. Pain radiates up to lower leg. Pt states he has history of gout and states pain is similar to previous gout flare ups. Pt has gout flare ups every 3-4 months. Pt takes colchicine and indocin to try and control flare ups and states that he does not think the medication has not been helping.   Pt reports eating red meat 3-4 times a week. Pt also reports drinking a soda a day. Pt reports drinking one or two alcoholic drinks a month. Pt states he had 1-2 beers a week ago.  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   Hypertension     Neuromuscular disorder [facility])     Past Surgical History:  Procedure Laterality Date   CORNEAL TRANSPLANT     left eye   DOBUTAMINE STRESS ECHO  10/12/2008   normal   left hip surgery     NASAL SEPTUM SURGERY     orthopedic left arm surgery     done following an accident   TONSILLECTOMY      Social History  Substance Use Topics   Smoking status: Former Smoker    Quit date: 07/22/2011   Smokeless tobacco: Former Neurosurgeon    Types: Snuff, Chew    Quit date: 07/22/2011   Alcohol use 3.0 - 6.0 oz/week    6 - 12 drink(s) per week     Comment: social    Family History  Problem Relation Age of Onset   Heart Problems Father    Stroke Maternal Grandfather    Heart Problems Maternal Grandfather    Mental illness Paternal Grandmother    Heart Problems Paternal Grandfather     Allergies  Allergen Reactions   Amoxicillin Hives    Medication list has been reviewed and updated.  Current Outpatient Prescriptions on File Prior to Visit  Medication Sig Dispense Refill   acetaminophen (TYLENOL) 500 MG tablet Take 500-1,000 mg by mouth every 6 (  six) hours as needed for mild pain or headache. Reported on 06/16/2015     albuterol (PROVENTIL HFA;VENTOLIN HFA) 108 (90 Base) MCG/ACT inhaler Inhale 2 puffs into the lungs every 6 (six) hours as needed for wheezing or shortness of breath (cough, shortness of breath or wheezing.). 1 Inhaler 0   allopurinol (ZYLOPRIM) 100 MG tablet Take 1 tablet (100 mg total) by mouth daily. 30 tablet 11   ALPRAZolam (XANAX) 0.5 MG tablet Take 1 tablet (0.5 mg total) by mouth 3 (three) times daily as needed for anxiety. To follow current prescription 90 tablet 5   beclomethasone (QVAR) 80 MCG/ACT inhaler Inhale 2 puffs into the lungs 2 (two) times daily. To use every single day as a prevention 1 Inhaler 12   colchicine 0.6 MG tablet Take 1 tablet (0.6 mg total) by mouth 2 (two) times daily. 60 tablet 1   metoprolol succinate (TOPROL-XL) 100 MG 24  hr tablet TAKE 1 TABLET (100 MG TOTAL) BY MOUTH DAILY. TAKE WITH OR IMMEDIATELY FOLLOWING A MEAL. 90 tablet 0   traMADol (ULTRAM) 50 MG tablet Take 1 tablet (50 mg total) by mouth every 8 (eight) hours as needed. (Patient not taking: Reported on 02/03/2016) 30 tablet 0   [DISCONTINUED] tadalafil (CIALIS) 20 MG tablet Take 1 tablet (20 mg total) by mouth daily as needed for erectile dysfunction. (Patient not taking: Reported on 03/20/2015) 5 tablet 0   [DISCONTINUED] temazepam (RESTORIL) 30 MG capsule Take 1 capsule (30 mg total) by mouth at bedtime as needed for sleep. (Patient taking differently: Take 30 mg by mouth at bedtime as needed for sleep. ) 30 capsule 5   No current facility-administered medications on file prior to visit.     ROS ROS unremarkable unless otherwise specified.   Physical Examination: BP 130/88 (BP Location: Left Arm, Patient Position: Sitting, Cuff Size: Normal)    Pulse 75    Temp 98.2 F (36.8 C) (Oral)    Resp 17    Ht 5\' 10"  (1.778 m)    Wt 240 lb (108.9 kg)    SpO2 99%    BMI 34.44 kg/m  Ideal Body Weight: @FLOWAMB ([id number])@([id number])@  Physical Exam  Constitutional: He is oriented to person, place, and time. He appears well-developed and well-nourished. No distress.  HENT:  Head: Normocephalic and atraumatic.  Eyes: Conjunctivae and EOM are normal. Pupils are equal, round, and reactive to light.  Cardiovascular: Normal rate.   Pulmonary/Chest: Effort normal. No respiratory distress.  Musculoskeletal:  Rt ankle no erythema, however substantial swelling lateral and medial malleolus with increased warmth. Rt ankle also has significant tenderness around medial malleolus and at base of anterior aspect of the lateral malleolus. There is decreased passive ROM of rt ankle in all planes secondary to pain. Negative Thompson's test, however pain incited with this movement.   Neurological: He is alert and oriented to person, place, and time.  Skin: Skin is warm and dry. He is  not diaphoretic. No erythema.  Increased warmth at rt ankle.  Psychiatric: He has a normal mood and affect. His behavior is normal.   Dg Ankle Complete Right  Result Date: 02/03/2016 CLINICAL DATA:  RIGHT ankle swelling for 2 days without trauma, history gout, hypertension, former smoker EXAM: RIGHT ANKLE - COMPLETE 3+ VIEW COMPARISON:  08/12/2012 FINDINGS: Soft tissue swelling diffusely. Osseous mineralization normal. Joint spaces preserved. Spur versus non fused ossicle at tip of medial malleolus little changed. No definite acute fracture, dislocation or bone destruction. Plantar and Achilles insertion calcaneal  spur formation. No definite erosive changes seen to suggest gout. IMPRESSION: Soft tissue swelling and calcaneal spurring without definite acute bony abnormalities. Electronically Signed   By: Ulyses Southward M.D.   On: 02/03/2016 09:42     Assessment and Plan: KEATH MATERA is a 40 y.o. male who is here today for right ankle pain. --Severe swelling.   --given prednisone at this time.  Advised to return if symptoms do not improve.    Right ankle pain - Plan: DG Ankle Complete Right  Gout of right ankle, unspecified cause, unspecified chronicity - Plan: predniSONE (DELTASONE) 20 MG tablet, HYDROcodone-acetaminophen (NORCO) 5-325 MG tablet  Trena Platt, PA-C [facility] 8/9/20177:44 AM

## 2016-02-21 ENCOUNTER — Ambulatory Visit (INDEPENDENT_AMBULATORY_CARE_PROVIDER_SITE_OTHER): Payer: BLUE CROSS/BLUE SHIELD | Admitting: Physician Assistant

## 2016-02-21 ENCOUNTER — Encounter: Payer: Self-pay | Admitting: Physician Assistant

## 2016-02-21 VITALS — BP 148/88 | HR 78 | Temp 98.2°F | Resp 18 | Ht 70.0 in | Wt 232.0 lb

## 2016-02-21 DIAGNOSIS — R21 Rash and other nonspecific skin eruption: Secondary | ICD-10-CM | POA: Diagnosis not present

## 2016-02-21 MED ORDER — CLOTRIMAZOLE-BETAMETHASONE 1-0.05 % EX CREA: 1.0000 "application " | TOPICAL_CREAM | Freq: Two times a day (BID) | CUTANEOUS | 0 refills | Status: AC

## 2016-02-21 MED ORDER — FLUCONAZOLE 150 MG PO TABS: 150.0000 mg | ORAL_TABLET | ORAL | 0 refills | Status: DC

## 2016-02-21 NOTE — Patient Instructions (Addendum)
  Do not take colchicine while taking diflucan.  Return to clinic if symptoms worsen, do not improve, or as needed    Intertrigo Intertrigo is a skin irritation (inflammation) that happens in warm, moist areas of the body. It happens mostly between folds of skin or where skin rubs together. HOME CARE  Keep the affected area cool and dry.  Leave the skin folds open to air.  Put cotton or linen between the folds of skin.  Avoid tight clothing.  Wear open-toed shoes or sandals.  Use powder on the affected area as told by your doctor.  Only use medicated creams or pastes as told by your doctor. GET HELP RIGHT AWAY IF:  The rash does not get better after 1 week of treatment.  The rash gets worse.  You have a fever or chills. MAKE SURE YOU:  Understand these instructions.  Will watch your condition.  Will get help right away if you are not doing well or get worse.   This information is not intended to replace advice given to you by your health care provider. Make sure you discuss any questions you have with your health care provider.   Document Released: 07/18/2010 Document Revised: 09/07/2011 Document Reviewed: 12/17/2014 Elsevier Interactive Patient Education 2016 [employer]    IF you received an x-ray today, you will receive an invoice from [facility]. Please contact [facility] at [phone] with questions or concerns regarding your invoice.   IF you received labwork today, you will receive an invoice from [facility]. Please contact [facility] at [phone] with questions or concerns regarding your invoice.   Our billing staff will not be able to assist you with questions regarding bills from these companies.  You will be contacted with the lab results as soon as they are available. The fastest way to get your results is to activate your My Chart account. Instructions are located on the last page of this paperwork.  If you have not heard from us regarding the results in 2 weeks, please contact this office.

## 2016-02-21 NOTE — Progress Notes (Signed)
Brandon BertholdJoseph M Burggraf  MRN: [id number] DOB: 04/03/1976  Subjective:  Brandon Garza is a 40 y.o. male seen in office today for a chief complaint of rash in left groin x 5 days. Has associated pain and odor. Denies itching and pus drainage. Pt has recently changed laundry detergent but notes the rash is only localized in his groin. Denies any change in lotions, soaps, new medication, recent antibiotic use, or insect exposure. He has tried baby lotion and neosporin with minimal relief. Pt in a monogamous relationship with wife. No one at home has a similar rash.   Review of Systems  Constitutional: Negative for chills, fatigue and fever.  Genitourinary: Negative for difficulty urinating, discharge, dysuria, frequency, genital sores, penile pain and testicular pain.  Musculoskeletal: Negative for arthralgias and gait problem.  Neurological: Negative for numbness.    Patient Active Problem List   Diagnosis Date Noted  . Viral URI with cough 06/28/2013  . GERD (gastroesophageal reflux disease) 10/20/2012  . HTN (hypertension) 02/12/2012  . Stuttering 02/12/2012  . GAD (generalized anxiety disorder) 02/12/2012  . Nicotine use disorder 02/12/2012  . Chronic back pain 02/12/2012  . Hip dislocation, left (HCC) 02/12/2012  . Gout 08/04/2011    Current Outpatient Prescriptions on File Prior to Visit  Medication Sig Dispense Refill  . acetaminophen (TYLENOL) 500 MG tablet Take 500-1,000 mg by mouth every 6 (six) hours as needed for mild pain or headache. Reported on 06/16/2015    . albuterol (PROVENTIL HFA;VENTOLIN HFA) 108 (90 Base) MCG/ACT inhaler Inhale 2 puffs into the lungs every 6 (six) hours as needed for wheezing or shortness of breath (cough, shortness of breath or wheezing.). 1 Inhaler 0  . allopurinol (ZYLOPRIM) 100 MG tablet Take 1 tablet (100 mg total) by mouth daily. 30 tablet 11  . ALPRAZolam (XANAX) 0.5 MG tablet Take 1 tablet (0.5 mg total) by mouth 3 (three) times daily as needed  for anxiety. To follow current prescription 90 tablet 5  . beclomethasone (QVAR) 80 MCG/ACT inhaler Inhale 2 puffs into the lungs 2 (two) times daily. To use every single day as a prevention 1 Inhaler 12  . colchicine 0.6 MG tablet Take 1 tablet (0.6 mg total) by mouth 2 (two) times daily. 60 tablet 1  . metoprolol succinate (TOPROL-XL) 100 MG 24 hr tablet TAKE 1 TABLET (100 MG TOTAL) BY MOUTH DAILY. TAKE WITH OR IMMEDIATELY FOLLOWING A MEAL. 90 tablet 0  . HYDROcodone-acetaminophen (NORCO) 5-325 MG tablet Take 1 tablet by mouth every 6 (six) hours as needed. (Patient not taking: Reported on 02/21/2016) 20 tablet 0  . traMADol (ULTRAM) 50 MG tablet Take 1 tablet (50 mg total) by mouth every 8 (eight) hours as needed. (Patient not taking: Reported on 02/03/2016) 30 tablet 0  . [DISCONTINUED] tadalafil (CIALIS) 20 MG tablet Take 1 tablet (20 mg total) by mouth daily as needed for erectile dysfunction. (Patient not taking: Reported on 03/20/2015) 5 tablet 0  . [DISCONTINUED] temazepam (RESTORIL) 30 MG capsule Take 1 capsule (30 mg total) by mouth at bedtime as needed for sleep. (Patient taking differently: Take 30 mg by mouth at bedtime as needed for sleep. ) 30 capsule 5   No current facility-administered medications on file prior to visit.     Allergies  Allergen Reactions  . Amoxicillin Hives    Objective:  BP (!) 148/88 (BP Location: Left Arm, Patient Position: Sitting, Cuff Size: Large)   Pulse 78   Temp 98.2 F (36.8 C)  Resp 18   Ht 5\' 10"  (1.778 m)   Wt 232 lb (105.2 kg)   SpO2 98%   BMI 33.29 kg/m   Physical Exam  Constitutional: He is oriented to person, place, and time and well-developed, well-nourished, and in no distress.  HENT:  Head: Normocephalic and atraumatic.  Eyes: Conjunctivae are normal.  Neck: Normal range of motion.  Pulmonary/Chest: Effort normal.  Genitourinary: Penis normal.  Genitourinary Comments: Erythematous macular rash over left inguinal fold spreading  onto left thigh with satellite lesions over left testicle and right groin. Mild maceration of left thigh noted.   Neurological: He is alert and oriented to person, place, and time. Gait normal.  Skin: Skin is warm and dry.  Psychiatric: Affect normal.  Vitals reviewed.   Assessment and Plan :   1. Rash and nonspecific skin eruption -History of physical exam consistent with groin intertrigo  - fluconazole (DIFLUCAN) 150 MG tablet; Take 1 tablet (150 mg total) by mouth once a week. Take one pill weekly  Dispense: 2 tablet; Refill: 0 - clotrimazole-betamethasone (LOTRISONE) cream; Apply 1 application topically 2 (two) times daily. Apply thin layers.  Dispense: 30 g; Refill: 0 -Keep affected area dry, avoid tight clothing -Follow up in 2 weeks if symptoms do not improve or sooner if symptoms worsen -Pt currently on colchicine daily, informed that fluconazole and colchicine do interact. Pt states he will discontinue colchicine while on fluconazole.  Benjiman Core PA-C  [facility] 02/21/2016 9:14 AM

## 2016-02-29 ENCOUNTER — Other Ambulatory Visit: Payer: Self-pay | Admitting: Family Medicine

## 2016-03-06 ENCOUNTER — Ambulatory Visit (INDEPENDENT_AMBULATORY_CARE_PROVIDER_SITE_OTHER): Payer: BLUE CROSS/BLUE SHIELD | Admitting: Physician Assistant

## 2016-03-06 ENCOUNTER — Other Ambulatory Visit: Payer: Self-pay

## 2016-03-06 DIAGNOSIS — J40 Bronchitis, not specified as acute or chronic: Secondary | ICD-10-CM

## 2016-03-06 MED ORDER — BECLOMETHASONE DIPROPIONATE 80 MCG/ACT IN AERS: 2.0000 | INHALATION_SPRAY | Freq: Two times a day (BID) | RESPIRATORY_TRACT | 12 refills | Status: DC

## 2016-03-06 MED ORDER — GUAIFENESIN ER 1200 MG PO TB12: 1.0000 | ORAL_TABLET | Freq: Two times a day (BID) | ORAL | 1 refills | Status: DC | PRN

## 2016-03-06 MED ORDER — HYDROCOD POLST-CPM POLST ER 10-8 MG/5ML PO SUER: 5.0000 mL | Freq: Every evening | ORAL | 0 refills | Status: DC | PRN

## 2016-03-06 NOTE — Patient Instructions (Addendum)
IF you received an x-ray today, you will receive an invoice from [facility]. Please contact [facility] at [phone] with questions or concerns regarding your invoice.   IF you received labwork today, you will receive an invoice from [facility]. Please contact [facility] at [phone] with questions or concerns regarding your invoice.   Our billing staff will not be able to assist you with questions regarding bills from these companies.  You will be contacted with the lab results as soon as they are available. The fastest way to get your results is to activate your My Chart account. Instructions are located on the last page of this paperwork. If you have not heard from us regarding the results in 2 weeks, please contact this office.   Please hydrate well with 64 oz or more. Please let me know if there is no improvement     Acute Bronchitis Bronchitis is inflammation of the airways that extend from the windpipe into the lungs (bronchi). The inflammation often causes mucus to develop. This leads to a cough, which is the most common symptom of bronchitis.  In acute bronchitis, the condition usually develops suddenly and goes away over time, usually in a couple weeks. Smoking, allergies, and asthma can make bronchitis worse. Repeated episodes of bronchitis may cause further lung problems.  CAUSES Acute bronchitis is most often caused by the same virus that causes a cold. The virus can spread from person to person (contagious) through coughing, sneezing, and touching contaminated objects. SIGNS AND SYMPTOMS   Cough.   Fever.   Coughing up mucus.   Body aches.   Chest congestion.   Chills.   Shortness of breath.   Sore throat.  DIAGNOSIS  Acute bronchitis is usually diagnosed through a physical exam. Your health care provider will also ask you questions about your medical history. Tests, such as chest X-rays, are sometimes  done to rule out other conditions.  TREATMENT  Acute bronchitis usually goes away in a couple weeks. Oftentimes, no medical treatment is necessary. Medicines are sometimes given for relief of fever or cough. Antibiotic medicines are usually not needed but may be prescribed in certain situations. In some cases, an inhaler may be recommended to help reduce shortness of breath and control the cough. A cool mist vaporizer may also be used to help thin bronchial secretions and make it easier to clear the chest.  HOME CARE INSTRUCTIONS  Get plenty of rest.   Drink enough fluids to keep your urine clear or pale yellow (unless you have a medical condition that requires fluid restriction). Increasing fluids may help thin your respiratory secretions (sputum) and reduce chest congestion, and it will prevent dehydration.   Take medicines only as directed by your health care provider.  If you were prescribed an antibiotic medicine, finish it all even if you start to feel better.  Avoid smoking and secondhand smoke. Exposure to cigarette smoke or irritating chemicals will make bronchitis worse. If you are a smoker, consider using nicotine gum or skin patches to help control withdrawal symptoms. Quitting smoking will help your lungs heal faster.   Reduce the chances of another bout of acute bronchitis by washing your hands frequently, avoiding people with cold symptoms, and trying not to touch your hands to your mouth, nose, or eyes.   Keep all follow-up visits as directed by your health care provider.  SEEK MEDICAL CARE IF: Your symptoms do not improve after 1 week of treatment.  SEEK IMMEDIATE MEDICAL CARE IF:  You develop an increased fever or chills.   You have chest pain.   You have severe shortness of breath.  You have bloody sputum.   You develop dehydration.  You faint or repeatedly feel like you are going to pass out.  You develop repeated vomiting.  You develop a severe  headache. MAKE SURE YOU:   Understand these instructions.  Will watch your condition.  Will get help right away if you are not doing well or get worse.   This information is not intended to replace advice given to you by your health care provider. Make sure you discuss any questions you have with your health care provider.   Document Released: 07/23/2004 Document Revised: 07/06/2014 Document Reviewed: 12/06/2012 Elsevier Interactive Patient Education [employer]

## 2016-03-06 NOTE — Progress Notes (Signed)
sent 

## 2016-03-06 NOTE — Progress Notes (Signed)
Patient ID: Brandon Garza, male   DOB: 04/17/1976, 40 y.o.   MRN: [id number] [facility] [address], [place] [address] [phone]  By signing my name below, I, [name], attest that this documentation has been prepared under the direction and in the presence of Trena PlattStephanie English, PA-C Electronically Signed: Charline BillsEssence Howell, ED Scribe 03/06/2016 at 3:50 PM.  Date:  03/06/2016   Name:  Brandon Garza   DOB:  11/13/1975   MRN:  [id number]  PCP:  Nilda SimmerSMITH,KRISTI, MD   History of Present Illness:  Brandon Garza is a 40 y.o. male patient who [facility] complaining of gradually worsening dry cough onset 3-4 days ago. Pt reports associated symptoms of minimal nasal congestion, sore throat, intermittent HA. He denies fever and ear pain. Pt has tried Tylenol and NyQuil last night without relief. He states that he has used his Qvar inhaler but has not picked up his albuterol inhaler from the pharmacy yet. Pt does not smoke cigarettes but currently chews tobacco. Allergy to amoxicillin.   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   Hypertension    Neuromuscular disorder [facility](HCC)     Past Surgical History:  Procedure Laterality Date   CORNEAL TRANSPLANT     left eye   DOBUTAMINE STRESS ECHO  10/12/2008   normal   left hip surgery     NASAL SEPTUM SURGERY     orthopedic left arm surgery     done following an accident    TONSILLECTOMY      Social History  Substance Use Topics   Smoking status: Former Smoker    Quit date: 07/22/2011   Smokeless tobacco: Former NeurosurgeonUser    Types: Snuff, Chew    Quit date: 07/22/2011   Alcohol use 3.0 - 6.0 oz/week    6 - 12 drink(s) per week     Comment: social    Family History  Problem Relation Age of Onset   Heart Problems Father    Stroke Maternal Grandfather    Heart Problems Maternal Grandfather    Mental illness Paternal Grandmother    Heart Problems Paternal Grandfather     Allergies  Allergen Reactions   Amoxicillin Hives    Medication list has been reviewed and updated.  Current Outpatient Prescriptions on File Prior to Visit  Medication Sig Dispense Refill   acetaminophen (TYLENOL) 500 MG tablet Take 500-1,000 mg by mouth every 6 (six) hours as needed for mild pain or headache. Reported on 06/16/2015     albuterol (PROVENTIL HFA;VENTOLIN HFA) 108 (90 Base) MCG/ACT inhaler Inhale 2 puffs into the lungs every 6 (six) hours as needed for wheezing or shortness of breath (cough, shortness of breath or wheezing.). 1 Inhaler 0   allopurinol (ZYLOPRIM) 100 MG tablet Take 1 tablet (100 mg total) by mouth daily. 30 tablet 11   ALPRAZolam Prudy Feeler(XANAX)  0.5 MG tablet Take 1 tablet (0.5 mg total) by mouth 3 (three) times daily as needed for anxiety. To follow current prescription 90 tablet 5   beclomethasone (QVAR) 80 MCG/ACT inhaler Inhale 2 puffs into the lungs 2 (two) times daily. To use every single day as a prevention 1 Inhaler 12   colchicine 0.6 MG tablet Take 1 tablet (0.6 mg total) by mouth 2 (two) times daily. 60 tablet 1   fluconazole (DIFLUCAN) 150 MG tablet Take 1 tablet (150 mg total) by mouth once a week. Take one pill weekly 2 tablet 0   HYDROcodone-acetaminophen (NORCO) 5-325 MG tablet Take 1 tablet by mouth every 6 (six) hours as needed. (Patient not taking: Reported on 03/06/2016) 20 tablet 0   metoprolol succinate (TOPROL-XL) 100 MG 24 hr  tablet TAKE 1 TABLET (100 MG TOTAL) BY MOUTH DAILY. TAKE WITH OR IMMEDIATELY FOLLOWING A MEAL. (Patient not taking: Reported on 03/06/2016) 90 tablet 0   traMADol (ULTRAM) 50 MG tablet Take 1 tablet (50 mg total) by mouth every 8 (eight) hours as needed. (Patient not taking: Reported on 03/06/2016) 30 tablet 0   [DISCONTINUED] tadalafil (CIALIS) 20 MG tablet Take 1 tablet (20 mg total) by mouth daily as needed for erectile dysfunction. (Patient not taking: Reported on 03/20/2015) 5 tablet 0   [DISCONTINUED] temazepam (RESTORIL) 30 MG capsule Take 1 capsule (30 mg total) by mouth at bedtime as needed for sleep. (Patient taking differently: Take 30 mg by mouth at bedtime as needed for sleep. ) 30 capsule 5   No current facility-administered medications on file prior to visit.     Review of Systems  Constitutional: Negative for fever.  HENT: Positive for congestion and sore throat. Negative for ear pain.   Respiratory: Positive for cough.   Neurological: Positive for headaches.    Physical Examination: BP (!) 154/80 (BP Location: Right Arm, Patient Position: Sitting, Cuff Size: Large)    Pulse 81    Temp 98.3 F (36.8 C)    Resp 18    Wt 235 lb 9.6 oz (106.9 kg)    SpO2 97%    BMI 33.81 kg/m  Ideal Body Weight: @FLOWAMB ([id number])@  Physical Exam  Constitutional: He is oriented to person, place, and time. He appears well-developed and well-nourished. No distress.  HENT:  Head: Normocephalic and atraumatic.  Right Ear: Tympanic membrane, external ear and ear canal normal.  Left Ear: Tympanic membrane, external ear and ear canal normal.  Nose: Mucosal edema and rhinorrhea present. Right sinus exhibits no maxillary sinus tenderness and no frontal sinus tenderness. Left sinus exhibits no maxillary sinus tenderness and no frontal sinus tenderness.  Mouth/Throat: No uvula swelling. No oropharyngeal exudate, posterior oropharyngeal edema or posterior oropharyngeal erythema.  Eyes: Conjunctivae, EOM  and lids are normal. Pupils are equal, round, and reactive to light. Right eye exhibits normal extraocular motion. Left eye exhibits normal extraocular motion.  Neck: Trachea normal and full passive range of motion without pain. No edema and no erythema present.  Cardiovascular: Normal rate, regular rhythm, normal heart sounds and normal pulses.  Exam reveals no gallop and no friction rub.   No murmur heard. Pulmonary/Chest: Effort normal. No respiratory distress. He has no decreased breath sounds. He has no wheezes. He has no rhonchi.  Neurological: He is alert and oriented to person, place, and time.  Skin: Skin is warm and dry. He is not diaphoretic.  Psychiatric: He has a normal mood and affect. His behavior is normal.  Assessment and Plan: EYTAN CARRIGAN is a 40 y.o. male who is here today for cc of cough. Advised to use his preventative treatment.  Treating supportively. Contact if symptoms worsen.  Bronchitis - Plan: Guaifenesin (MUCINEX MAXIMUM STRENGTH) 1200 MG TB12, beclomethasone (QVAR) 80 MCG/ACT inhaler, chlorpheniramine-HYDROcodone (TUSSIONEX PENNKINETIC ER) 10-8 MG/5ML SUER  Trena Platt, PA-C [facility] 9/19/20177:21 AM  Trena Platt, PA-C [facility] 03/06/2016 3:49 PM

## 2016-03-10 ENCOUNTER — Telehealth: Payer: Self-pay

## 2016-03-10 NOTE — Telephone Encounter (Signed)
Patient was instructed if not feeling better to call the office and request for a Z-Pac. Patient is not improving. He feel worse. [phone].

## 2016-03-11 ENCOUNTER — Telehealth: Payer: Self-pay | Admitting: Radiology

## 2016-03-11 MED ORDER — AZITHROMYCIN 250 MG PO TABS: ORAL_TABLET | ORAL | 0 refills | Status: DC

## 2016-03-11 NOTE — Telephone Encounter (Signed)
Ordering zpak.  Went to [employer] and can not find roi.

## 2016-03-11 NOTE — Telephone Encounter (Signed)
Attempted to contact pt concerning [employer] paperwork for the crutches he received on 02/03/16. Inform pt he needs to RTC to sign form.

## 2016-03-21 ENCOUNTER — Other Ambulatory Visit: Payer: Self-pay | Admitting: Family Medicine

## 2016-03-23 ENCOUNTER — Ambulatory Visit (INDEPENDENT_AMBULATORY_CARE_PROVIDER_SITE_OTHER): Payer: BLUE CROSS/BLUE SHIELD

## 2016-03-23 ENCOUNTER — Ambulatory Visit (INDEPENDENT_AMBULATORY_CARE_PROVIDER_SITE_OTHER): Payer: BLUE CROSS/BLUE SHIELD | Admitting: Family Medicine

## 2016-03-23 VITALS — BP 150/88 | HR 86 | Temp 98.4°F | Resp 17 | Ht 70.0 in | Wt 232.0 lb

## 2016-03-23 DIAGNOSIS — B3789 Other sites of candidiasis: Secondary | ICD-10-CM

## 2016-03-23 DIAGNOSIS — M79671 Pain in right foot: Secondary | ICD-10-CM | POA: Diagnosis not present

## 2016-03-23 DIAGNOSIS — I10 Essential (primary) hypertension: Secondary | ICD-10-CM

## 2016-03-23 MED ORDER — FLUCONAZOLE 150 MG PO TABS: 150.0000 mg | ORAL_TABLET | Freq: Every day | ORAL | 0 refills | Status: DC

## 2016-03-23 MED ORDER — CLOTRIMAZOLE-BETAMETHASONE 1-0.05 % EX CREA: 1.0000 "application " | TOPICAL_CREAM | Freq: Two times a day (BID) | CUTANEOUS | 0 refills | Status: DC

## 2016-03-23 MED ORDER — FLUCONAZOLE 150 MG PO TABS
150.0000 mg | ORAL_TABLET | Freq: Every day | ORAL | 0 refills | Status: AC
Start: 2016-03-23 — End: 2016-03-30

## 2016-03-23 MED ORDER — CHLORTHALIDONE 25 MG PO TABS: 12.5000 mg | ORAL_TABLET | Freq: Every day | ORAL | 0 refills | Status: DC

## 2016-03-23 NOTE — Patient Instructions (Addendum)
Groin Rash Start fluconazole 150 mg daily times 7 days. Apply Lotrisone cream to affected area twice daily for 10 days.  Foot Pain Take Naproxen 500 mg times twice daily as needed for foot pain for 14-21 days.   Hypertension  Continue metoprolol 100 mg daily. Start Chlorthalidone 12.5 mg once daily. Return in 4 weeks for blood pressure recheck.   IF you received an x-ray today, you will receive an invoice from [facility]. Please contact [facility] at [phone] with questions or concerns regarding your invoice.   IF you received labwork today, you will receive an invoice from [employer]. Please contact [facility] at [phone] with questions or concerns regarding your invoice.   Our billing staff will not be able to assist you with questions regarding bills from these companies.  You will be contacted with the lab results as soon as they are available. The fastest way to get your results is to activate your My Chart account. Instructions are located on the last page of this paperwork. If you have not heard from [place] regarding the results in 2 weeks, please contact this office.    Plantar Fasciitis With Rehab The plantar fascia is a fibrous, ligament-like, soft-tissue structure that spans the bottom of the foot. Plantar fasciitis, also called heel spur syndrome, is a condition that causes pain in the foot due to inflammation of the tissue. SYMPTOMS   Pain and tenderness on the underneath side of the foot.  Pain that worsens with standing or walking. CAUSES  Plantar fasciitis is caused by irritation and injury to the plantar fascia on the underneath side of the foot. Common mechanisms of injury include:  Direct trauma to bottom of the foot.  Damage to a small nerve that runs under the foot where the main fascia attaches to the heel bone.  Stress placed on the plantar fascia due to bone spurs. RISK INCREASES WITH:   Activities that  place stress on the plantar fascia (running, jumping, pivoting, or cutting).  Poor strength and flexibility.  Improperly fitted shoes.  Tight calf muscles.  Flat feet.  Failure to warm-up properly before activity.  Obesity. PREVENTION  Warm up and stretch properly before activity.  Allow for adequate recovery between workouts.  Maintain physical fitness:  Strength, flexibility, and endurance.  Cardiovascular fitness.  Maintain a health body weight.  Avoid stress on the plantar fascia.  Wear properly fitted shoes, including arch supports for individuals who have flat feet. PROGNOSIS  If treated properly, then the symptoms of plantar fasciitis usually resolve without surgery. However, occasionally surgery is necessary. RELATED COMPLICATIONS   Recurrent symptoms that may result in a chronic condition.  Problems of the lower back that are caused by compensating for the injury, such as limping.  Pain or weakness of the foot during push-off following surgery.  Chronic inflammation, scarring, and partial or complete fascia tear, occurring more often from repeated injections. TREATMENT  Treatment initially involves the use of ice and medication to help reduce pain and inflammation. The use of strengthening and stretching exercises may help reduce pain with activity, especially stretches of the Achilles tendon. These exercises may be performed at home or with a therapist. Your caregiver may recommend that you use heel cups of arch supports to help reduce stress on the plantar fascia. Occasionally, corticosteroid injections are given to reduce inflammation. If symptoms persist for greater than 6 months despite non-surgical (conservative), then surgery may be recommended.  MEDICATION   If pain medication  is necessary, then nonsteroidal anti-inflammatory medications, such as aspirin and ibuprofen, or other minor pain relievers, such as acetaminophen, are often recommended.  Do not  take pain medication within 7 days before surgery.  Prescription pain relievers may be given if deemed necessary by your caregiver. Use only as directed and only as much as you need.  Corticosteroid injections may be given by your caregiver. These injections should be reserved for the most serious cases, because they may only be given a certain number of times. HEAT AND COLD  Cold treatment (icing) relieves pain and reduces inflammation. Cold treatment should be applied for 10 to 15 minutes every 2 to 3 hours for inflammation and pain and immediately after any activity that aggravates your symptoms. Use ice packs or massage the area with a piece of ice (ice massage).  Heat treatment may be used prior to performing the stretching and strengthening activities prescribed by your caregiver, physical therapist, or athletic trainer. Use a heat pack or soak the injury in warm water. SEEK IMMEDIATE MEDICAL CARE IF:  Treatment seems to offer no benefit, or the condition worsens.  Any medications produce adverse side effects. EXERCISES RANGE OF MOTION (ROM) AND STRETCHING EXERCISES - Plantar Fasciitis (Heel Spur Syndrome) These exercises may help you when beginning to rehabilitate your injury. Your symptoms may resolve with or without further involvement from your physician, physical therapist or athletic trainer. While completing these exercises, remember:   Restoring tissue flexibility helps normal motion to return to the joints. This allows healthier, less painful movement and activity.  An effective stretch should be held for at least 30 seconds.  A stretch should never be painful. You should only feel a gentle lengthening or release in the stretched tissue. RANGE OF MOTION - Toe Extension, Flexion  Sit with your right / left leg crossed over your opposite knee.  Grasp your toes and gently pull them back toward the top of your foot. You should feel a stretch on the bottom of your toes and/or  foot.  Hold this stretch for __________ seconds.  Now, gently pull your toes toward the bottom of your foot. You should feel a stretch on the top of your toes and or foot.  Hold this stretch for __________ seconds. Repeat __________ times. Complete this stretch __________ times per day.  RANGE OF MOTION - Ankle Dorsiflexion, Active Assisted  Remove shoes and sit on a chair that is preferably not on a carpeted surface.  Place right / left foot under knee. Extend your opposite leg for support.  Keeping your heel down, slide your right / left foot back toward the chair until you feel a stretch at your ankle or calf. If you do not feel a stretch, slide your bottom forward to the edge of the chair, while still keeping your heel down.  Hold this stretch for __________ seconds. Repeat __________ times. Complete this stretch __________ times per day.  STRETCH - Gastroc, Standing  Place hands on wall.  Extend right / left leg, keeping the front knee somewhat bent.  Slightly point your toes inward on your back foot.  Keeping your right / left heel on the floor and your knee straight, shift your weight toward the wall, not allowing your back to arch.  You should feel a gentle stretch in the right / left calf. Hold this position for __________ seconds. Repeat __________ times. Complete this stretch __________ times per day. STRETCH - Soleus, Standing  Place hands on wall.  Extend  right / left leg, keeping the other knee somewhat bent.  Slightly point your toes inward on your back foot.  Keep your right / left heel on the floor, bend your back knee, and slightly shift your weight over the back leg so that you feel a gentle stretch deep in your back calf.  Hold this position for __________ seconds. Repeat __________ times. Complete this stretch __________ times per day. STRETCH - Gastrocsoleus, Standing  Note: This exercise can place a lot of stress on your foot and ankle. Please  complete this exercise only if specifically instructed by your caregiver.   Place the ball of your right / left foot on a step, keeping your other foot firmly on the same step.  Hold on to the wall or a rail for balance.  Slowly lift your other foot, allowing your body weight to press your heel down over the edge of the step.  You should feel a stretch in your right / left calf.  Hold this position for __________ seconds.  Repeat this exercise with a slight bend in your right / left knee. Repeat __________ times. Complete this stretch __________ times per day.  STRENGTHENING EXERCISES - Plantar Fasciitis (Heel Spur Syndrome)  These exercises may help you when beginning to rehabilitate your injury. They may resolve your symptoms with or without further involvement from your physician, physical therapist or athletic trainer. While completing these exercises, remember:   Muscles can gain both the endurance and the strength needed for everyday activities through controlled exercises.  Complete these exercises as instructed by your physician, physical therapist or athletic trainer. Progress the resistance and repetitions only as guided. STRENGTH - Towel Curls  Sit in a chair positioned on a non-carpeted surface.  Place your foot on a towel, keeping your heel on the floor.  Pull the towel toward your heel by only curling your toes. Keep your heel on the floor.  If instructed by your physician, physical therapist or athletic trainer, add ____________________ at the end of the towel. Repeat __________ times. Complete this exercise __________ times per day. STRENGTH - Ankle Inversion  Secure one end of a rubber exercise band/tubing to a fixed object (table, pole). Loop the other end around your foot just before your toes.  Place your fists between your knees. This will focus your strengthening at your ankle.  Slowly, pull your big toe up and in, making sure the band/tubing is positioned to  resist the entire motion.  Hold this position for __________ seconds.  Have your muscles resist the band/tubing as it slowly pulls your foot back to the starting position. Repeat __________ times. Complete this exercises __________ times per day.    This information is not intended to replace advice given to you by your health care provider. Make sure you discuss any questions you have with your health care provider.   Document Released: 06/15/2005 Document Revised: 10/30/2014 Document Reviewed: 09/27/2008 Elsevier Interactive Patient Education [employer]

## 2016-03-23 NOTE — Progress Notes (Signed)
Patient ID: Brandon Garza, male    DOB: 02/03/1976, 40 y.o.   MRN: [id number]  PCP: Nilda SimmerSMITH,KRISTI, MD  Chief Complaint  Patient presents with  . Foot Pain    Rt onset 4-5 days ago  . Rash    groin area onset 1 week    Subjective:   HPI 40 year old presents for evaluation of right foot pain and a reoccurring rash of the groin.  Foot Pain Patient reports 5 days ago he experienced pain in right foot towards the outer heel and lateral distal ankle. Reports pain is minimized with rest and intensifies with standing. He reports "sensation of stabbing". Denies any recent injury. He reports a hiistory of gout and plantar fascitiis.  He has taken aspirin and tylenol for pain with minimal relief of symptoms.  Rash Groin [facility] for same type rash which resolved with treatment.  He reports rash has reoccurred. Reports a rash that itches and burns within the skin folds of his groin.   Hypertension Patient reports that he doesn't check his blood pressure at home, although he has been taking his medication regularly. He is aware that his blood pressure has been elevated the last few times he has been seen in the office.  Social History   Social History  . Marital status: Single    Spouse name: N/A  . Number of children: N/A  . Years of education: N/A   Occupational History  . Not on file.   Social History Main Topics  . Smoking status: Former Smoker    Quit date: 07/22/2011  . Smokeless tobacco: Former NeurosurgeonUser    Types: Snuff, Chew    Quit date: 07/22/2011  . Alcohol use 3.0 - 6.0 oz/week    6 - 12 drink(s) per week     Comment: social  . Drug use: No  . Sexual activity: Yes    Birth control/ protection: None   Other Topics Concern  . Not on file   Social History Narrative  . No narrative on file   Family History  Problem Relation Age of Onset  . Heart Problems Father   . Stroke Maternal Grandfather   . Heart Problems Maternal Grandfather   .  Mental illness Paternal Grandmother   . Heart Problems Paternal Grandfather    Review of Systems See HPI  Patient Active Problem List   Diagnosis Date Noted  . Viral URI with cough 06/28/2013  . GERD (gastroesophageal reflux disease) 10/20/2012  . HTN (hypertension) 02/12/2012  . Stuttering 02/12/2012  . GAD (generalized anxiety disorder) 02/12/2012  . Nicotine use disorder 02/12/2012  . Chronic back pain 02/12/2012  . Hip dislocation, left (HCC) 02/12/2012  . Gout 08/04/2011     Prior to Admission medications   Medication Sig Start Date End Date Taking? Authorizing Provider  acetaminophen (TYLENOL) 500 MG tablet Take 500-1,000 mg by mouth every 6 (six) hours as needed for mild pain or headache. Reported on 06/16/2015   Yes Historical Provider, MD  albuterol (PROVENTIL HFA;VENTOLIN HFA) 108 (90 Base) MCG/ACT inhaler Inhale 2 puffs into the lungs every 6 (six) hours as needed for wheezing or shortness of breath (cough, shortness of breath or wheezing.). 07/05/15  Yes Peyton Najjaravid H Hopper, MD  allopurinol (ZYLOPRIM) 100 MG tablet Take 1 tablet (100 mg total) by mouth daily. 12/28/15  Yes Collene GobbleSteven A Daub, MD  ALPRAZolam Prudy Feeler(XANAX) 0.5 MG tablet Take 1 tablet (0.5 mg total) by mouth 3 (three)  times daily as needed for anxiety. To follow current prescription 12/28/15  Yes Collene Gobble, MD  beclomethasone (QVAR) 80 MCG/ACT inhaler Inhale 2 puffs into the lungs 2 (two) times daily. To use every single day as a prevention 03/06/16  Yes Stephanie D English, PA  colchicine 0.6 MG tablet Take 1 tablet (0.6 mg total) by mouth 2 (two) times daily. 12/28/15  Yes Collene Gobble, MD  fluticasone (FLONASE) 50 MCG/ACT nasal spray PLACE 2 SPRAYS INTO BOTH NOSTRILS 2 TIMES DAILY. 03/21/16  Yes Ethelda Chick, MD  metoprolol succinate (TOPROL-XL) 100 MG 24 hr tablet TAKE 1 TABLET (100 MG TOTAL) BY MOUTH DAILY. TAKE WITH OR IMMEDIATELY FOLLOWING A MEAL. 07/12/15  Yes Peyton Najjar, MD     Allergies  Allergen Reactions  .  Amoxicillin Hives     Objective:  Physical Exam  Constitutional: He is oriented to person, place, and time. He appears well-developed and well-nourished.  HENT:  Head: Normocephalic and atraumatic.  Right Ear: External ear normal.  Left Ear: External ear normal.  Nose: Nose normal.  Eyes: Conjunctivae are normal. Pupils are equal, round, and reactive to light.  Neck: Normal range of motion. Neck supple.  Cardiovascular: Normal rate, regular rhythm, normal heart sounds and intact distal pulses.   Pulmonary/Chest: Effort normal.  Musculoskeletal: He exhibits tenderness.  Limited inversion and eversion. Tenderness without weight bearing.   Neurological: He is alert and oriented to person, place, and time.  Skin: Skin is warm and dry. Rash noted. There is erythema.  Satellite lesions present bilaterally in pubic and groin region. Erythematous non-vesicular rash.  Psychiatric: He has a normal mood and affect. His behavior is normal. Judgment and thought content normal.    Vitals:   03/23/16 1424  BP: (!) 150/88  Pulse: 86  Resp: 17  Temp: 98.4 F (36.9 C)  Dg Foot Complete Right  Result Date: 03/23/2016 CLINICAL DATA:  Foot pain 5 days EXAM: RIGHT FOOT COMPLETE - 3+ VIEW COMPARISON:  03/20/2015 FINDINGS: Normal alignment. No fracture or arthropathy. Calcaneal spurring similar to the prior study. IMPRESSION: Calcaneal spurring.  Otherwise negative Electronically Signed   By: Marlan Palau M.D.   On: 03/23/2016 15:11   Assessment & Plan:  1. Right foot pain, likely plantar fasciitis  - DG Foot Complete Right Plan: Take naproxen 500 mg twice daily with food 14-21 days.   2. Candida rash of groin, recurring.  Patient is obese and skin folds in groin region likely retain moisture potentially leading to re-occurrence of candidiasis infection.  Plan: . fluconazole (DIFLUCAN) 150 MG tablet    Sig: Take 1 tablet (150 mg total) by mouth daily.    Dispense:  7 tablet  .  clotrimazole-betamethasone (LOTRISONE) cream    Sig: Apply 1 application topically 2 (two) times daily.   3. Essential Hypertension, uncontrolled  During the last 3 visits since medi august, systolic pressure has been greater than 150's systolic. Patient is currently taking metoprolol 100 mg extended release once daily.    Plan: Continue metoprolol 100 mg once daily and add chlorthalidone 12.5 mg once daily. Return for blood pressure re evaluation in 4 weeks.  Godfrey Pick. Tiburcio Pea, MSN, FNP-C [facility]  Follow-up as needed.

## 2016-03-25 MED ORDER — NAPROXEN 500 MG PO TABS: 500.0000 mg | ORAL_TABLET | Freq: Two times a day (BID) | ORAL | 0 refills | Status: DC

## 2016-05-01 ENCOUNTER — Other Ambulatory Visit: Payer: Self-pay

## 2016-05-01 DIAGNOSIS — J40 Bronchitis, not specified as acute or chronic: Secondary | ICD-10-CM

## 2016-05-01 MED ORDER — BECLOMETHASONE DIPROPIONATE 80 MCG/ACT IN AERS: 2.0000 | INHALATION_SPRAY | Freq: Two times a day (BID) | RESPIRATORY_TRACT | 2 refills | Status: DC

## 2016-05-08 ENCOUNTER — Ambulatory Visit (INDEPENDENT_AMBULATORY_CARE_PROVIDER_SITE_OTHER): Payer: BLUE CROSS/BLUE SHIELD

## 2016-05-08 ENCOUNTER — Ambulatory Visit (INDEPENDENT_AMBULATORY_CARE_PROVIDER_SITE_OTHER): Payer: BLUE CROSS/BLUE SHIELD | Admitting: Urgent Care

## 2016-05-08 VITALS — BP 122/72 | HR 71 | Temp 98.1°F | Resp 17 | Ht 71.0 in | Wt 234.0 lb

## 2016-05-08 DIAGNOSIS — R0602 Shortness of breath: Secondary | ICD-10-CM | POA: Diagnosis not present

## 2016-05-08 DIAGNOSIS — J42 Unspecified chronic bronchitis: Secondary | ICD-10-CM

## 2016-05-08 DIAGNOSIS — R05 Cough: Secondary | ICD-10-CM

## 2016-05-08 DIAGNOSIS — J3089 Other allergic rhinitis: Secondary | ICD-10-CM | POA: Diagnosis not present

## 2016-05-08 DIAGNOSIS — R059 Cough, unspecified: Secondary | ICD-10-CM

## 2016-05-08 LAB — POCT CBC
GRANULOCYTE PERCENT: 73.9 % (ref 37–80)
HEMATOCRIT: 43.2 % — AB (ref 43.5–53.7)
Hemoglobin: 15.5 g/dL (ref 14.1–18.1)
Lymph, poc: 1.4 (ref 0.6–3.4)
MCH: 31.6 pg — AB (ref 27–31.2)
MCHC: 35.8 g/dL — AB (ref 31.8–35.4)
MCV: 88.4 fL (ref 80–97)
MID (cbc): 1.2 — AB (ref 0–0.9)
MPV: 9.7 fL (ref 0–99.8)
PLATELET COUNT, POC: 190 10*3/uL (ref 142–424)
POC GRANULOCYTE: 7.3 — AB (ref 2–6.9)
POC LYMPH PERCENT: 14.4 %L (ref 10–50)
POC MID %: 11.7 %M (ref 0–12)
RBC: 4.89 M/uL (ref 4.69–6.13)
RDW, POC: 12.1 %
WBC: 9.9 10*3/uL (ref 4.6–10.2)

## 2016-05-08 MED ORDER — CETIRIZINE HCL 10 MG PO TABS: 10.0000 mg | ORAL_TABLET | Freq: Every day | ORAL | 11 refills | Status: DC

## 2016-05-08 MED ORDER — ALBUTEROL SULFATE HFA 108 (90 BASE) MCG/ACT IN AERS: 2.0000 | INHALATION_SPRAY | Freq: Four times a day (QID) | RESPIRATORY_TRACT | 11 refills | Status: DC | PRN

## 2016-05-08 MED ORDER — HYDROCODONE-HOMATROPINE 5-1.5 MG/5ML PO SYRP: 5.0000 mL | ORAL_SOLUTION | Freq: Every evening | ORAL | 0 refills | Status: DC | PRN

## 2016-05-08 MED ORDER — FLUTICASONE PROPIONATE 50 MCG/ACT NA SUSP: 2.0000 | Freq: Every day | NASAL | 11 refills | Status: DC

## 2016-05-08 MED ORDER — PSEUDOEPHEDRINE HCL ER 120 MG PO TB12: 120.0000 mg | ORAL_TABLET | Freq: Two times a day (BID) | ORAL | 3 refills | Status: DC

## 2016-05-08 MED ORDER — BENZONATATE 100 MG PO CAPS: 100.0000 mg | ORAL_CAPSULE | Freq: Three times a day (TID) | ORAL | 0 refills | Status: DC | PRN

## 2016-05-08 NOTE — Progress Notes (Signed)
MRN: [id number] DOB: 04/21/1976  Subjective:   Brandon Garza is a 40 y.o. male presenting for chief complaint of Shortness of Breath and Immunizations (flu shot)  Reports 4 day history of dry cough, shob, wheezing, postnasal drainage, mild sinus pain, sinus congestion. Has tried Mucinex, NyQuil, DayQuil with minimal temporary relief. Denies fever, ear pain, ear drainage, sore throat, chest pain, n/v, abdominal pain, rashes, myalgia. Has a history of seasonal allergies, managed with Flonase. Denies smoking cigarettes, drinks occasionally. Used to smoke 1ppd for ~12 years, quit 4 years ago. Denies history of asthma. Has a history of chronic bronchitis, managed with albuterol and QVAR, he is currently out of these medications. QVAR was refilled recently.   Jomarie LongsJoseph has a current medication list which includes the following prescription(s): acetaminophen, albuterol, allopurinol, alprazolam, beclomethasone, chlorthalidone, clotrimazole-betamethasone, colchicine, fluticasone, metoprolol succinate, and naproxen. Also is allergic to amoxicillin.  Jomarie LongsJoseph  has a past medical history of Acid reflux; Allergy; Anxiety; Arthritis; Bronchitis; Depression with anxiety; Herpes simplex viral infection; Hypertension; and Neuromuscular disorder (HCC). Also  has a past surgical history that includes Corneal transplant; Tonsillectomy; Nasal septum surgery; orthopedic left arm surgery; left hip surgery; and Dobutamine stress echo (10/12/2008).   Objective:   Vitals: BP 122/72 (BP Location: Right Arm, Patient Position: Sitting, Cuff Size: Normal)   Pulse 71   Temp 98.1 F (36.7 C) (Oral)   Resp 17   Ht 5\' 11"  (1.803 m)   Wt 234 lb (106.1 kg)   SpO2 96%   BMI 32.64 kg/m   Physical Exam  Constitutional: He is oriented to person, place, and time. He appears well-developed and well-nourished.  HENT:  TM's intact bilaterally, no effusions or erythema. Nasal turbinates pink and moist, nasal passages patent. No  sinus tenderness. Oropharynx clear, mucous membranes moist, dentition in good repair.  Eyes: Right eye exhibits no discharge. Left eye exhibits no discharge. No scleral icterus.  Neck: Normal range of motion. Neck supple.  Cardiovascular: Normal rate, regular rhythm and intact distal pulses.  Exam reveals no gallop and no friction rub.   No murmur heard. Pulmonary/Chest: No respiratory distress. He has no wheezes. He has rales (coarse lung sounds throughout).  Lymphadenopathy:    He has no cervical adenopathy.  Neurological: He is alert and oriented to person, place, and time.  Skin: Skin is warm. He is diaphoretic (without fever).   Dg Chest 2 View  Result Date: 05/08/2016 CLINICAL DATA:  Cough with wheezing and shortness of breath for 4 days EXAM: CHEST  2 VIEW COMPARISON:  December 09, 2015 FINDINGS: Lungs are clear. The heart size and pulmonary vascularity are normal. No adenopathy. No bone lesions. IMPRESSION: No edema or consolidation. Electronically Signed   By: Bretta BangWilliam  Woodruff III M.D.   On: 05/08/2016 14:24   Results for orders placed or performed in visit on 05/08/16 (from the past 24 hour(s))  POCT CBC     Status: Abnormal   Collection Time: 05/08/16  2:18 PM  Result Value Ref Range   WBC 9.9 4.6 - 10.2 K/uL   Lymph, poc 1.4 0.6 - 3.4   POC LYMPH PERCENT 14.4 10 - 50 %L   MID (cbc) 1.2 (A) 0 - 0.9   POC MID % 11.7 0 - 12 %M   POC Granulocyte 7.3 (A) 2 - 6.9   Granulocyte percent 73.9 37 - 80 %G   RBC 4.89 4.69 - 6.13 M/uL   Hemoglobin 15.5 14.1 - 18.1 g/dL   HCT, POC  43.2 (A) 43.5 - 53.7 %   MCV 88.4 80 - 97 fL   MCH, POC 31.6 (A) 27 - 31.2 pg   MCHC 35.8 (A) 31.8 - 35.4 g/dL   RDW, POC 57.812.1 %   Platelet Count, POC 190 142 - 424 K/uL   MPV 9.7 0 - 99.8 fL   Assessment and Plan :   1. Cough 2. Shortness of breath 3. Chronic bronchitis, unspecified chronic bronchitis type (HCC) - Likely undergoing viral URI with cough. Recommended he start supportive care. Restart  and schedule albuterol inhaler, QVAR. Also add Sudafed and cough suppression therapy - If no improvement or symptoms do not resolve return to clinic in 3 days.   4. Seasonal allergic rhinitis due to other allergic trigger, unspecified chronicity - Restart Flonase, add Zyrtec for better allergy control.  Wallis BambergMario Demetres Prochnow, PA-C [facility] [phone] 05/08/2016 1:56 PM

## 2016-05-08 NOTE — Patient Instructions (Addendum)
Cough, Adult Coughing is a reflex that clears your throat and your airways. Coughing helps to heal and protect your lungs. It is normal to cough occasionally, but a cough that happens with other symptoms or lasts a long time may be a sign of a condition that needs treatment. A cough may last only 2-3 weeks (acute), or it may last longer than 8 weeks (chronic). CAUSES Coughing is commonly caused by:  Breathing in substances that irritate your lungs.  A viral or bacterial respiratory infection.  Allergies.  Asthma.  Postnasal drip.  Smoking.  Acid backing up from the stomach into the esophagus (gastroesophageal reflux).  Certain medicines.  Chronic lung problems, including COPD (or rarely, lung cancer).  Other medical conditions such as heart failure. HOME CARE INSTRUCTIONS  Pay attention to any changes in your symptoms. Take these actions to help with your discomfort:  Take medicines only as told by your health care provider.  If you were prescribed an antibiotic medicine, take it as told by your health care provider. Do not stop taking the antibiotic even if you start to feel better.  Talk with your health care provider before you take a cough suppressant medicine.  Drink enough fluid to keep your urine clear or pale yellow.  If the air is dry, use a cold steam vaporizer or humidifier in your bedroom or your home to help loosen secretions.  Avoid anything that causes you to cough at work or at home.  If your cough is worse at night, try sleeping in a semi-upright position.  Avoid cigarette smoke. If you smoke, quit smoking. If you need help quitting, ask your health care provider.  Avoid caffeine.  Avoid alcohol.  Rest as needed. SEEK MEDICAL CARE IF:   You have new symptoms.  You cough up pus.  Your cough does not get better after 2-3 weeks, or your cough gets worse.  You cannot control your cough with suppressant medicines and you are losing sleep.  You  develop pain that is getting worse or pain that is not controlled with pain medicines.  You have a fever.  You have unexplained weight loss.  You have night sweats. SEEK IMMEDIATE MEDICAL CARE IF:  You cough up blood.  You have difficulty breathing.  Your heartbeat is very fast.   This information is not intended to replace advice given to you by your health care provider. Make sure you discuss any questions you have with your health care provider.   Document Released: 12/12/2010 Document Revised: 03/06/2015 Document Reviewed: 08/22/2014 Elsevier Interactive Patient Education 2016 Elsevier Inc.     IF you received an x-ray today, you will receive an invoice from [facility]. Please contact [facility] at [phone] with questions or concerns regarding your invoice.   IF you received labwork today, you will receive an invoice from [facility]. Please contact [facility] at [phone] with questions or concerns regarding your invoice.   Our billing staff will not be able to assist you with questions regarding bills from these companies.  You will be contacted with the lab results as soon as they are available. The fastest way to get your results is to activate your My Chart account. Instructions are located on the last page of this paperwork. If you have not heard from us regarding the results in 2 weeks, please contact this office.      

## 2016-05-15 ENCOUNTER — Emergency Department (HOSPITAL_COMMUNITY)
Admission: EM | Admit: 2016-05-15 | Discharge: 2016-05-15 | Disposition: A | Discharge status: Home / Self Care | Payer: BLUE CROSS/BLUE SHIELD | Attending: Emergency Medicine | Admitting: Emergency Medicine

## 2016-05-15 ENCOUNTER — Encounter (HOSPITAL_COMMUNITY): Payer: Self-pay | Admitting: Emergency Medicine

## 2016-05-15 ENCOUNTER — Emergency Department (HOSPITAL_COMMUNITY): Payer: BLUE CROSS/BLUE SHIELD

## 2016-05-15 DIAGNOSIS — J40 Bronchitis, not specified as acute or chronic: Secondary | ICD-10-CM | POA: Diagnosis not present

## 2016-05-15 DIAGNOSIS — Z87891 Personal history of nicotine dependence: Secondary | ICD-10-CM | POA: Diagnosis not present

## 2016-05-15 DIAGNOSIS — Z79899 Other long term (current) drug therapy: Secondary | ICD-10-CM | POA: Insufficient documentation

## 2016-05-15 DIAGNOSIS — I10 Essential (primary) hypertension: Secondary | ICD-10-CM | POA: Insufficient documentation

## 2016-05-15 DIAGNOSIS — J069 Acute upper respiratory infection, unspecified: Secondary | ICD-10-CM | POA: Diagnosis present

## 2016-05-15 MED ORDER — ALBUTEROL SULFATE (2.5 MG/3ML) 0.083% IN NEBU
5.0000 mg | INHALATION_SOLUTION | Freq: Once | RESPIRATORY_TRACT | Status: AC
  Administered 2016-05-15: 5 mg via RESPIRATORY_TRACT
  Filled 2016-05-15: qty 6

## 2016-05-15 MED ORDER — PREDNISONE 20 MG PO TABS: 40.0000 mg | ORAL_TABLET | Freq: Every day | ORAL | 0 refills | Status: DC

## 2016-05-15 MED ORDER — IPRATROPIUM BROMIDE 0.02 % IN SOLN
0.5000 mg | Freq: Once | RESPIRATORY_TRACT | Status: AC
  Administered 2016-05-15: 0.5 mg via RESPIRATORY_TRACT
  Filled 2016-05-15: qty 2.5

## 2016-05-15 MED ORDER — PREDNISONE 20 MG PO TABS
60.0000 mg | ORAL_TABLET | Freq: Once | ORAL | Status: AC
  Administered 2016-05-15: 60 mg via ORAL
  Filled 2016-05-15: qty 3

## 2016-05-15 MED ORDER — ALBUTEROL (5 MG/ML) CONTINUOUS INHALATION SOLN
15.0000 mg/h | INHALATION_SOLUTION | Freq: Once | RESPIRATORY_TRACT | Status: AC
  Administered 2016-05-15: 15 mg/h via RESPIRATORY_TRACT
  Filled 2016-05-15: qty 20

## 2016-05-15 NOTE — ED Provider Notes (Signed)
[facility]-EMER[facility]ENCY DEPT Provider Note   CSN: [id number] Arrival date & time: 05/15/16  0518     History   Chief Complaint Chief Complaint  Patient presents with  . URI    HPI Brandon Garza is a 40 y.o. male.  The history is provided by the patient.  URI   This is a recurrent problem. Episode onset: 2 weeks ago. The problem has not changed since onset.There has been no fever. Associated symptoms include congestion, plugged ear sensation, rhinorrhea, cough and wheezing. Pertinent negatives include no abdominal pain, no diarrhea, no nausea, no vomiting, no dysuria and no sore throat. Associated symptoms comments: No leg swelling, recent trips or surgery. He has tried an inhaler for the symptoms. The treatment provided no relief.    Past Medical History:  Diagnosis Date  . Acid reflux   . Allergy   . Anxiety   . Arthritis   . Bronchitis   . Depression with anxiety   . 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 Hypertension   . Neuromuscular disorder [facility](HCC)     Patient Active Problem List   Diagnosis Date Noted  . Viral URI with cough 06/28/2013  . GERD (gastroesophageal reflux disease) 10/20/2012  . HTN (hypertension) 02/12/2012  . Stuttering 02/12/2012  . GAD (generalized anxiety disorder) 02/12/2012  . Nicotine use disorder 02/12/2012  . Chronic back pain 02/12/2012  . Hip dislocation, left (HCC) 02/12/2012  . Gout 08/04/2011    Past Surgical History:  Procedure Laterality Date  . CORNEAL TRANSPLANT     left eye  . DOBUTAMINE STRESS ECHO  10/12/2008   normal  . left hip surgery    . NASAL SEPTUM SURGERY    . orthopedic left arm surgery     done following an accident  . TONSILLECTOMY         Home Medications    Prior to Admission medications   Medication Sig Start Date End Date Taking? Authorizing Provider  acetaminophen (TYLENOL) 500 MG tablet Take 500-1,000 mg by mouth every 6 (six) hours as needed for mild pain or headache. Reported on  06/16/2015   Yes Historical Provider, MD  albuterol (PROVENTIL HFA;VENTOLIN HFA) 108 (90 Base) MCG/ACT inhaler Inhale 2 puffs into the lungs every 6 (six) hours as needed for wheezing or shortness of breath (cough, shortness of breath or wheezing.). 05/08/16  Yes Wallis BambergMario Mani, PA-C  ALPRAZolam Prudy Feeler(XANAX) 0.5 MG tablet Take 1 tablet (0.5 mg total) by mouth 3 (three) times daily as needed for anxiety. To follow current prescription 12/28/15  Yes Collene GobbleSteven A Daub, MD  beclomethasone (QVAR) 80 MCG/ACT inhaler Inhale 2 puffs into the lungs 2 (two) times daily. To use every single day as a prevention 05/01/16  Yes Stephanie D English, PA  benzonatate (TESSALON) 100 MG capsule Take 1-2 capsules (100-200 mg total) by mouth 3 (three) times daily as needed for cough. 05/08/16  Yes Wallis BambergMario Mani, PA-C  cetirizine (ZYRTEC) 10 MG tablet Take 1 tablet (10 mg total) by mouth daily. 05/08/16  Yes Wallis BambergMario Mani, PA-C  chlorthalidone (HYGROTON) 25 MG tablet Take 0.5 tablets (12.5 mg total) by mouth daily. 03/23/16  Yes Doyle AskewKimberly Stephenia Harris, FNP  fluticasone (FLONASE) 50 MCG/ACT nasal spray Place 2 sprays into both nostrils daily. 05/08/16  Yes Wallis BambergMario Mani, PA-C  metoprolol succinate (TOPROL-XL) 100 MG 24 hr tablet TAKE 1 TABLET (100 MG TOTAL) BY MOUTH DAILY. TAKE WITH OR IMMEDIATELY FOLLOWING A MEAL. 07/12/15  Yes Peyton Najjaravid H Hopper,  MD  pseudoephedrine (SUDAFED 12 HOUR) 120 MG 12 hr tablet Take 1 tablet (120 mg total) by mouth 2 (two) times daily. 05/08/16  Yes Wallis Bamberg, PA-C  allopurinol (ZYLOPRIM) 100 MG tablet Take 1 tablet (100 mg total) by mouth daily. Patient not taking: Reported on 05/15/2016 12/28/15   Collene Gobble, MD  clotrimazole-betamethasone (LOTRISONE) cream Apply 1 application topically 2 (two) times daily. Patient not taking: Reported on 05/15/2016 03/23/16   Doyle Askew, FNP  colchicine 0.6 MG tablet Take 1 tablet (0.6 mg total) by mouth 2 (two) times daily. Patient not taking: Reported on 05/15/2016  12/28/15   Collene Gobble, MD  HYDROcodone-homatropine [facility]) 5-1.5 MG/5ML syrup Take 5 mLs by mouth at bedtime as needed. Patient not taking: Reported on 05/15/2016 05/08/16   Wallis Bamberg, PA-C  naproxen (NAPROSYN) 500 MG tablet Take 1 tablet (500 mg total) by mouth 2 (two) times daily with a meal. Patient not taking: Reported on 05/15/2016 03/25/16   Doyle Askew, FNP    Family History Family History  Problem Relation Age of Onset  . Heart Problems Father   . Stroke Maternal Grandfather   . Heart Problems Maternal Grandfather   . Mental illness Paternal Grandmother   . Heart Problems Paternal Grandfather     Social History Social History  Substance Use Topics  . Smoking status: Former Smoker    Quit date: 07/22/2011  . Smokeless tobacco: Former Neurosurgeon    Types: Snuff, Chew    Quit date: 07/22/2011  . Alcohol use 3.0 - 6.0 oz/week    6 - 12 Standard drinks or equivalent per week     Comment: social     Allergies   Amoxicillin   Review of Systems Review of Systems  HENT: Positive for congestion and rhinorrhea. Negative for sore throat.   Respiratory: Positive for cough and wheezing.   Gastrointestinal: Negative for abdominal pain, diarrhea, nausea and vomiting.  Genitourinary: Negative for dysuria.  All other systems reviewed and are negative.    Physical Exam Updated Vital Signs BP 156/77 (BP Location: Right Arm)   Pulse 77   Temp 98 F (36.7 C) (Oral)   Resp 19   Ht 5\' 10"  (1.778 m)   Wt 235 lb (106.6 kg)   SpO2 99%   BMI 33.72 kg/m   Physical Exam  Constitutional: He is oriented to person, place, and time. He appears well-developed and well-nourished. No distress.  HENT:  Head: Normocephalic and atraumatic.  Right Ear: A middle ear effusion is present.  Left Ear: A middle ear effusion is present.  Mouth/Throat: Oropharynx is clear and moist and mucous membranes are normal.  Eyes: Conjunctivae and EOM are normal. Pupils are equal, round, and  reactive to light.  Neck: Normal range of motion. Neck supple.  Cardiovascular: Normal rate, regular rhythm and intact distal pulses.   No murmur heard. Pulmonary/Chest: Effort normal. No respiratory distress. He has wheezes. He has no rales.  Abdominal: Soft. He exhibits no distension. There is no tenderness. There is no rebound and no guarding.  Musculoskeletal: Normal range of motion. He exhibits no edema or tenderness.  Neurological: He is alert and oriented to person, place, and time.  Skin: Skin is warm and dry. No rash noted. No erythema.  Psychiatric: He has a normal mood and affect. His behavior is normal.  Nursing note and vitals reviewed.    ED Treatments / Results  Labs (all labs ordered are listed, but only abnormal results  are displayed) Labs Reviewed - No data to display  EKG  EKG Interpretation None       Radiology Dg Chest 2 View  Result Date: 05/15/2016 CLINICAL DATA:  Cough for 2 weeks EXAM: CHEST  2 VIEW COMPARISON:  [id number] FINDINGS: The lungs are clear. The pulmonary vasculature is normal. Heart size is normal. Hilar and mediastinal contours are unremarkable. There is no pleural effusion. IMPRESSION: No active cardiopulmonary disease. Electronically Signed   By: Ellery Plunkaniel R Mitchell M.D.   On: 05/15/2016 06:59    Procedures Procedures (including critical care time)  Medications Ordered in ED Medications  albuterol (PROVENTIL) (2.5 MG/3ML) 0.083% nebulizer solution 5 mg (5 mg Nebulization Given 05/15/16 0726)  ipratropium (ATROVENT) nebulizer solution 0.5 mg (0.5 mg Nebulization Given 05/15/16 0726)  predniSONE (DELTASONE) tablet 60 mg (60 mg Oral Given 05/15/16 0724)     Initial Impression / Assessment and Plan / ED Course  I have reviewed the triage vital signs and the nursing notes.  Pertinent labs & imaging results that were available during my care of the patient were reviewed by me and considered in my medical decision making (see chart for  details).  Clinical Course    Pt with symptoms consistent with viral bronchitis.  Well appearing here.  No signs of breathing difficulty but wheezing on exam.  Former smoker but no hx of asthma.  Currently using albuterol/qvar.  No signs of pharyngitis, otitis or abnormal abdominal findings.   CXR wnl .  Pt given albuterol/atrovent and prednisone.  9:17 AM After 2 treatments pt's wheezing is worse.  Started on hour long neb.  12:47 PM After an hour-long neb patient's wheezing is now resolved. He will be sent home with prednisone. Tachycardia is related to an hour-long that. He is well appearing and able to ambulate without difficulty. Recommended follow-up with PCP on Monday  Final Clinical Impressions(s) / ED Diagnoses   Final diagnoses:  Bronchitis    New Prescriptions New Prescriptions   PREDNISONE (DELTASONE) 20 MG TABLET    Take 2 tablets (40 mg total) by mouth daily.     Gwyneth SproutWhitney Shelena Castelluccio, MD 05/15/16 1248

## 2016-05-15 NOTE — ED Triage Notes (Signed)
Patient seen for his URI about one week ago by his PCP and was given meds.  Patient states that he is not getting any better at this time.  Patient still has a cough and he is still having nasal drainage and some mild shortness of breath.

## 2016-06-26 ENCOUNTER — Ambulatory Visit (INDEPENDENT_AMBULATORY_CARE_PROVIDER_SITE_OTHER): Payer: BLUE CROSS/BLUE SHIELD | Admitting: Physician Assistant

## 2016-06-26 DIAGNOSIS — M1000 Idiopathic gout, unspecified site: Secondary | ICD-10-CM

## 2016-06-26 DIAGNOSIS — F8081 Childhood onset fluency disorder: Secondary | ICD-10-CM

## 2016-06-26 DIAGNOSIS — F411 Generalized anxiety disorder: Secondary | ICD-10-CM

## 2016-06-26 MED ORDER — BUPROPION HCL ER (XL) 150 MG PO TB24: 150.0000 mg | ORAL_TABLET | Freq: Two times a day (BID) | ORAL | 11 refills | Status: DC

## 2016-06-26 MED ORDER — ALPRAZOLAM 0.5 MG PO TABS: 0.5000 mg | ORAL_TABLET | Freq: Three times a day (TID) | ORAL | 5 refills | Status: DC | PRN

## 2016-06-26 MED ORDER — COLCHICINE 0.6 MG PO TABS: ORAL_TABLET | ORAL | 11 refills | Status: DC

## 2016-06-26 NOTE — Progress Notes (Signed)
[facility] [address], [place] [address] [phone]  Date:  06/26/2016   Name:  Brandon BertholdJoseph M Garza   DOB:  03/13/1976   MRN:  [id number]  PCP:  Nilda SimmerSMITH,KRISTI, MD   Chief Complaint  Patient presents with  . Medication Refill    Xanax, Wellbutrin, colchicine  . Flu Vaccine     History of Present Illness:  Brandon Garza is a 40 y.o. male patient who [facility] medication refill, and flu vaccine.    --xanax: patient takes the xanax three times per day.  He has had 3 panic attacks in the last 1.5 weeks.  Pacing, heart racing, and feelings of anxiety, excessive worrying.    --wellbutrin: he ran out of the wellbutrin about 2 days ago.  Feels much happier.    --colchicine: he takes it as needed for gouty flare.  Last episode was about 3 months ago.  Lasted for about 5 days.     Patient Active Problem List   Diagnosis Date Noted  . Viral URI with cough 06/28/2013  . GERD (gastroesophageal reflux disease) 10/20/2012  . HTN (hypertension) 02/12/2012  . Stuttering 02/12/2012  . GAD (generalized anxiety disorder) 02/12/2012  . Nicotine use disorder 02/12/2012  . Chronic back pain 02/12/2012  . Hip dislocation, left (HCC) 02/12/2012  . Gout 08/04/2011    Past Medical History:  Diagnosis Date  . Acid reflux   . Allergy   . Anxiety   . Arthritis   . Bronchitis   . Depression with anxiety   . 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 Hypertension   . Neuromuscular disorder [facility](HCC)     Past Surgical History:  Procedure Laterality Date  . CORNEAL TRANSPLANT     left eye  . DOBUTAMINE STRESS ECHO  10/12/2008   normal  . left hip surgery    . NASAL SEPTUM SURGERY    . orthopedic left arm surgery     done following an accident  . TONSILLECTOMY      Social History  Substance Use Topics  . Smoking status: Former Smoker    Quit date: 07/22/2011  . Smokeless tobacco: Former NeurosurgeonUser    Types: Snuff, Chew    Quit date: 07/22/2011   . Alcohol use 3.0 - 6.0 oz/week    6 - 12 Standard drinks or equivalent per week     Comment: social    Family History  Problem Relation Age of Onset  . Heart Problems Father   . Stroke Maternal Grandfather   . Heart Problems Maternal Grandfather   . Mental illness Paternal Grandmother   . Heart Problems Paternal Grandfather     Allergies  Allergen Reactions  . Amoxicillin Hives    Medication list has been reviewed and updated.  Current Outpatient Prescriptions on File Prior to Visit  Medication Sig Dispense Refill  . acetaminophen (TYLENOL) 500 MG tablet Take 500-1,000 mg by mouth every 6 (six) hours as needed for mild pain or headache. Reported on 06/16/2015    . albuterol (PROVENTIL HFA;VENTOLIN HFA) 108 (90 Base) MCG/ACT inhaler Inhale 2 puffs into the lungs every 6 (six) hours as needed for wheezing or shortness of breath (cough, shortness of breath or wheezing.). 1 Inhaler 11  . allopurinol (ZYLOPRIM) 100 MG tablet Take 1 tablet (100 mg total) by mouth daily. 30 tablet 11  . ALPRAZolam (XANAX) 0.5 MG tablet Take 1 tablet (0.5 mg total) by mouth  3 (three) times daily as needed for anxiety. To follow current prescription 90 tablet 5  . beclomethasone (QVAR) 80 MCG/ACT inhaler Inhale 2 puffs into the lungs 2 (two) times daily. To use every single day as a prevention 3 Inhaler 2  . cetirizine (ZYRTEC) 10 MG tablet Take 1 tablet (10 mg total) by mouth daily. 30 tablet 11  . chlorthalidone (HYGROTON) 25 MG tablet Take 0.5 tablets (12.5 mg total) by mouth daily. 35 tablet 0  . colchicine 0.6 MG tablet Take 1 tablet (0.6 mg total) by mouth 2 (two) times daily. 60 tablet 1  . fluticasone (FLONASE) 50 MCG/ACT nasal spray Place 2 sprays into both nostrils daily. 16 g 11  . metoprolol succinate (TOPROL-XL) 100 MG 24 hr tablet TAKE 1 TABLET (100 MG TOTAL) BY MOUTH DAILY. TAKE WITH OR IMMEDIATELY FOLLOWING A MEAL. 90 tablet 0  . naproxen (NAPROSYN) 500 MG tablet Take 1 tablet (500 mg  total) by mouth 2 (two) times daily with a meal. 30 tablet 0  . HYDROcodone-homatropine (HYCODAN) 5-1.5 MG/5ML syrup Take 5 mLs by mouth at bedtime as needed. (Patient not taking: Reported on 06/26/2016) 100 mL 0  . [DISCONTINUED] tadalafil (CIALIS) 20 MG tablet Take 1 tablet (20 mg total) by mouth daily as needed for erectile dysfunction. (Patient not taking: Reported on 03/20/2015) 5 tablet 0  . [DISCONTINUED] temazepam (RESTORIL) 30 MG capsule Take 1 capsule (30 mg total) by mouth at bedtime as needed for sleep. (Patient taking differently: Take 30 mg by mouth at bedtime as needed for sleep. ) 30 capsule 5   No current facility-administered medications on file prior to visit.     ROS ROS otherwise unremarkable unless listed above.   Physical Examination: BP (!) 108/58   Pulse (!) 56   Temp 98.1 F (36.7 C) (Oral)   Resp 16   Ht 5\' 11"  (1.803 m)   Wt 239 lb (108.4 kg)   SpO2 97%   BMI 33.33 kg/m  Ideal Body Weight: Weight in (lb) to have BMI = 25: 178.9  Physical Exam  Constitutional: He is oriented to person, place, and time. He appears well-developed and well-nourished. No distress.  HENT:  Head: Normocephalic and atraumatic.  Eyes: Conjunctivae and EOM are normal. Pupils are equal, round, and reactive to light.  Cardiovascular: Normal rate and regular rhythm.  Exam reveals no gallop and no distant heart sounds.   No murmur heard. Pulses:      Radial pulses are 2+ on the right side, and 2+ on the left side.       Dorsalis pedis pulses are 2+ on the right side, and 2+ on the left side.  Pulmonary/Chest: Effort normal. No respiratory distress.  Neurological: He is alert and oriented to person, place, and time.  Skin: Skin is warm and dry. He is not diaphoretic.  Psychiatric: He has a normal mood and affect. His behavior is normal.     Assessment and Plan: Brandon Garza is a 40 y.o. male who is here today for medication refill and flu vaccine. Stuttering - Plan:  ALPRAZolam (XANAX) 0.5 MG tablet  Generalized anxiety disorder - Plan: ALPRAZolam (XANAX) 0.5 MG tablet  Idiopathic gout, unspecified chronicity, unspecified site Trena PlattStephanie Keeton Kassebaum, PA-C [facility] 06/26/2016 2:40 PM

## 2016-06-26 NOTE — Patient Instructions (Signed)
     IF you received an x-ray today, you will receive an invoice from [facility]. Please contact [facility] at [phone] with questions or concerns regarding your invoice.   IF you received labwork today, you will receive an invoice from [facility]. Please contact [facility] at [phone] with questions or concerns regarding your invoice.   Our billing staff will not be able to assist you with questions regarding bills from these companies.  You will be contacted with the lab results as soon as they are available. The fastest way to get your results is to activate your My Chart account. Instructions are located on the last page of this paperwork. If you have not heard from us regarding the results in 2 weeks, please contact this office.     

## 2016-08-01 ENCOUNTER — Ambulatory Visit (INDEPENDENT_AMBULATORY_CARE_PROVIDER_SITE_OTHER): Payer: BLUE CROSS/BLUE SHIELD | Admitting: Urgent Care

## 2016-08-01 VITALS — BP 138/70 | HR 76 | Temp 98.0°F | Resp 16 | Ht 69.5 in | Wt 229.0 lb

## 2016-08-01 DIAGNOSIS — R21 Rash and other nonspecific skin eruption: Secondary | ICD-10-CM

## 2016-08-01 MED ORDER — NYSTATIN 100000 UNIT/GM EX POWD: Freq: Three times a day (TID) | CUTANEOUS | 0 refills | Status: DC

## 2016-08-01 MED ORDER — CLINDAMYCIN PHOSPHATE 1 % EX GEL: Freq: Two times a day (BID) | CUTANEOUS | 0 refills | Status: DC

## 2016-08-01 NOTE — Progress Notes (Signed)
  MRN: [id number] DOB: 06/07/1976  Subjective:   Brandon Garza is a 41 y.o. male presenting for chief complaint of Rash (groin area)  Reports 1 week history of worsening rash over groin area bilaterally. Rash is red, painful and stinging. He previously had a similar rash that was treated for a fungal infection. It resolved with antifungal topically and orally as well as topical steroid. However, he admits that the rash then was more itchy and this is not. Denies fever, dysuria, penile drainage, penile pain or testicular pain, swelling, hematuria.   Brandon Garza has a current medication list which includes the following prescription(s): acetaminophen, albuterol, allopurinol, alprazolam, beclomethasone, bupropion, chlorthalidone, colchicine, fluticasone, metoprolol succinate, naproxen, and cetirizine. Also is allergic to amoxicillin.  Brandon Garza  has a past medical history of Acid reflux; Allergy; Anxiety; Arthritis; Bronchitis; Depression with anxiety; Herpes simplex viral infection; Hypertension; and Neuromuscular disorder (HCC). Also  has a past surgical history that includes Corneal transplant; Tonsillectomy; Nasal septum surgery; orthopedic left arm surgery; left hip surgery; and Dobutamine stress echo (10/12/2008).  Objective:   Vitals: BP 138/70   Pulse 76   Temp 98 F (36.7 C)   Resp 16   Ht 5' 9.5" (1.765 m)   Wt 229 lb (103.9 kg)   SpO2 98%   BMI 33.33 kg/m   Physical Exam  Constitutional: He is oriented to person, place, and time. He appears well-developed and well-nourished.  Cardiovascular: Normal rate.   Pulmonary/Chest: Effort normal.  Genitourinary:     Neurological: He is alert and oriented to person, place, and time.  Skin: Skin is warm and dry. Rash noted. There is erythema.   Assessment and Plan :   1. Groin rash - Will cover for fungal infection as well as secondary bacterial infection. Patient will use Nystatin powder and clindamycin gel. RTC in 1 week for recheck  or sooner if worsening.   Wallis BambergMario Stashia Sia, PA-C [facility] [phone] 08/01/2016  12:01 PM

## 2016-08-01 NOTE — Patient Instructions (Signed)
Clindamycin skin solution or gel What is this medicine? CLINDAMYCIN (Lynn sin) is a lincosamide antibiotic. It is used on the skin to stop the growth of certain bacteria that cause acne. This medicine may be used for other purposes; ask your health care provider or pharmacist if you have questions. COMMON BRAND NAME(S): Cleocin T, Clinda-Derm, Clindagel, ClindaMax What should I tell my health care provider before I take this medicine? They need to know if you have any of these conditions: -diarrhea -inflammatory bowel disease -kidney or liver disease -stomach problems like colitis -an unusual or allergic reaction to clindamycin, lincomycin, other medicines, foods, dyes or preservatives -pregnant or trying to get pregnant -breast-feeding How should I use this medicine? This medicine is for external use only. Wash hands before and after use. Wash affected area and gently pat dry. Apply a thin layer of this medicine to the affected area as often as prescribed by your doctor or health care professional. Do not use skin products near the eyes, nose, or mouth. If you do get any in your eyes rinse out with plenty of cool tap water. Do not use your medicine more often than directed. Talk to your pediatrician regarding the use of this medicine in children. Special care may be needed. While this medicine may be prescribed for children as young as 12 years for selected conditions, precautions do apply. Overdosage: If you think you have taken too much of this medicine contact a poison control center or emergency room at once. NOTE: This medicine is only for you. Do not share this medicine with others. What if I miss a dose? If you miss a dose, use it as soon as you can. If it is almost time for your next dose, use only that dose. Do not use double or extra doses. What may interact with this medicine? -medicated cosmetics, including coverup preparations -other acne products including benzoyl  peroxide, salicylic acid, or tretinoin -skin care products that have a high alcohol content (some shaving creams, lotions, or after shave lotion) -some skin cleansers or medicated soaps This list may not describe all possible interactions. Give your health care provider a list of all the medicines, herbs, non-prescription drugs, or dietary supplements you use. Also tell them if you smoke, drink alcohol, or use illegal drugs. Some items may interact with your medicine. What should I watch for while using this medicine? Your acne should start to get better within about 6 weeks. Complete improvement may take longer. Tell your doctor or health care professional if you do not see any improvement. Your skin may get very dry and scale or peel. Let your doctor or health care professional know if this happens. Do not use any soothing cream or ointment without advice. What side effects may I notice from receiving this medicine? Side effects that you should report to your doctor or health care professional as soon as possible: -allergic reactions like skin rash, itching or hives, swelling of the face, lips, or tongue -diarrhea that is watery or severe -pain on swallowing -stomach pain or cramps -unusual bleeding or bruising Side effects that usually do not require medical attention (report to your doctor or health care professional if they continue or are bothersome): -dry skin -nausea, vomiting This list may not describe all possible side effects. Call your doctor for medical advice about side effects. You may report side effects to FDA at 1-800-FDA-1088. Where should I keep my medicine? Keep out of the reach of children. Store  at room temperature between 20 and 25 degrees C (68 and 77 degrees F). Do not freeze. Throw away any unused medicine after the expiration date. NOTE: This sheet is a summary. It may not cover all possible information. If you have questions about this medicine, talk to your doctor,  pharmacist, or health care provider.  2017 Elsevier/Gold Standard (2013-01-19 16:17:10)

## 2016-08-03 ENCOUNTER — Telehealth: Payer: Self-pay | Admitting: *Deleted

## 2016-08-03 NOTE — Telephone Encounter (Signed)
Patient is calling back to check the status because he is hurting from the rash.  can someone give him a call at [phone]

## 2016-08-03 NOTE — Telephone Encounter (Signed)
Patient called stating the powder and cream that was called in for his rash really isn't helping with the pain, patient said the rash has not gotten any better. Patient is requesting something else, pt states his rash is very painful. Please advise [phone]

## 2016-08-04 NOTE — Telephone Encounter (Signed)
PT ADVISED TO COME BACK IN FOR RE CHECK, HE WANTS TO WAIT ANOTHER DAY AND SEE US TOMORROW IF NOT BETTER

## 2016-08-08 ENCOUNTER — Encounter (HOSPITAL_COMMUNITY): Payer: Self-pay

## 2016-08-08 ENCOUNTER — Emergency Department (HOSPITAL_COMMUNITY)
Admission: EM | Admit: 2016-08-08 | Discharge: 2016-08-08 | Disposition: A | Discharge status: Home / Self Care | Payer: BLUE CROSS/BLUE SHIELD | Attending: Emergency Medicine | Admitting: Emergency Medicine

## 2016-08-08 DIAGNOSIS — Z79899 Other long term (current) drug therapy: Secondary | ICD-10-CM | POA: Insufficient documentation

## 2016-08-08 DIAGNOSIS — Z87891 Personal history of nicotine dependence: Secondary | ICD-10-CM | POA: Diagnosis not present

## 2016-08-08 DIAGNOSIS — M109 Gout, unspecified: Secondary | ICD-10-CM | POA: Insufficient documentation

## 2016-08-08 DIAGNOSIS — I10 Essential (primary) hypertension: Secondary | ICD-10-CM | POA: Diagnosis not present

## 2016-08-08 DIAGNOSIS — M25571 Pain in right ankle and joints of right foot: Secondary | ICD-10-CM | POA: Diagnosis present

## 2016-08-08 MED ORDER — PREDNISONE 20 MG PO TABS: ORAL_TABLET | ORAL | 0 refills | Status: DC

## 2016-08-08 MED ORDER — OXYCODONE-ACETAMINOPHEN 5-325 MG PO TABS
1.0000 | ORAL_TABLET | Freq: Once | ORAL | Status: AC
  Administered 2016-08-08: 1 via ORAL
  Filled 2016-08-08: qty 1

## 2016-08-08 MED ORDER — COLCHICINE 0.6 MG PO TABS: ORAL_TABLET | ORAL | 0 refills | Status: DC

## 2016-08-08 MED ORDER — INDOMETHACIN 50 MG PO CAPS: 50.0000 mg | ORAL_CAPSULE | Freq: Two times a day (BID) | ORAL | 0 refills | Status: DC

## 2016-08-08 NOTE — ED Provider Notes (Signed)
[facility]-EMERGENCY DEPT Provider Note   CSN: [id number] Arrival date & time: 08/08/16  4098     History   Chief Complaint Chief Complaint  Patient presents with  . Ankle Pain    HPI Brandon Garza is a 41 y.o. male.  HPI   41 year old male with history of gout presenting complaining of pain to his right ankle. For the past 5 days patient reports gradual onset of throbbing sharp pain involving his right ankle and foot that felt similar to prior gouty flare that he normally developed at least 3 or 4 times a year. Pain is worsening with ambulation palpation. Pain is not adequately controlled by ibuprofen and Motrin at home. He is continuing to take his allopurinol with minimal relief. He denies any associated fever, numbness, or rash. He admits to eating red meat 3-4 times a week but states he has cut out alcohol intake. He denies any recent injury.   Past Medical History:  Diagnosis Date  . Acid reflux   . Allergy   . Anxiety   . Arthritis   . Bronchitis   . Depression with anxiety   . 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 Hypertension   . Neuromuscular disorder [facility])     Patient Active Problem List   Diagnosis Date Noted  . Viral URI with cough 06/28/2013  . GERD (gastroesophageal reflux disease) 10/20/2012  . HTN (hypertension) 02/12/2012  . Stuttering 02/12/2012  . GAD (generalized anxiety disorder) 02/12/2012  . Nicotine use disorder 02/12/2012  . Chronic back pain 02/12/2012  . Hip dislocation, left (HCC) 02/12/2012  . Gout 08/04/2011    Past Surgical History:  Procedure Laterality Date  . CORNEAL TRANSPLANT     left eye  . DOBUTAMINE STRESS ECHO  10/12/2008   normal  . left hip surgery    . NASAL SEPTUM SURGERY    . orthopedic left arm surgery     done following an accident  . TONSILLECTOMY         Home Medications    Prior to Admission medications   Medication Sig Start Date End Date Taking? Authorizing Provider  acetaminophen  (TYLENOL) 500 MG tablet Take 500-1,000 mg by mouth every 6 (six) hours as needed for mild pain or headache. Reported on 06/16/2015    Historical Provider, MD  albuterol (PROVENTIL HFA;VENTOLIN HFA) 108 (90 Base) MCG/ACT inhaler Inhale 2 puffs into the lungs every 6 (six) hours as needed for wheezing or shortness of breath (cough, shortness of breath or wheezing.). 05/08/16   Wallis Bamberg, PA-C  allopurinol (ZYLOPRIM) 100 MG tablet Take 1 tablet (100 mg total) by mouth daily. 12/28/15   Collene Gobble, MD  ALPRAZolam Prudy Feeler) 0.5 MG tablet Take 1 tablet (0.5 mg total) by mouth 3 (three) times daily as needed for anxiety. To follow current prescription 06/26/16   Collie Siad English, PA  beclomethasone (QVAR) 80 MCG/ACT inhaler Inhale 2 puffs into the lungs 2 (two) times daily. To use every single day as a prevention 05/01/16   Collie Siad English, PA  buPROPion (WELLBUTRIN XL) 150 MG 24 hr tablet Take 1 tablet (150 mg total) by mouth 2 (two) times daily. 06/26/16   Garnetta Buddy, PA  cetirizine (ZYRTEC) 10 MG tablet Take 1 tablet (10 mg total) by mouth daily. Patient not taking: Reported on 08/01/2016 05/08/16   Wallis Bamberg, PA-C  chlorthalidone (HYGROTON) 25 MG tablet Take 0.5 tablets (12.5 mg total) by mouth daily. 03/23/16  Doyle AskewKimberly Stephenia Harris, FNP  clindamycin (CLINDAGEL) 1 % gel Apply topically 2 (two) times daily. 08/01/16   Wallis BambergMario Mani, PA-C  colchicine 0.6 MG tablet 1.2 mg at the first sign of flare, followed in 1 hour with a single dose of 0.6 mg (maximum: 1.8 mg within 1 hour).  Repeat as needed. 06/26/16   Collie SiadStephanie D English, PA  fluticasone (FLONASE) 50 MCG/ACT nasal spray Place 2 sprays into both nostrils daily. 05/08/16   Wallis BambergMario Mani, PA-C  metoprolol succinate (TOPROL-XL) 100 MG 24 hr tablet TAKE 1 TABLET (100 MG TOTAL) BY MOUTH DAILY. TAKE WITH OR IMMEDIATELY FOLLOWING A MEAL. 07/12/15   Peyton Najjaravid H Hopper, MD  naproxen (NAPROSYN) 500 MG tablet Take 1 tablet (500 mg total) by mouth 2 (two)  times daily with a meal. 03/25/16   Doyle AskewKimberly Stephenia Harris, FNP  nystatin (MYCOSTATIN/NYSTOP) powder Apply topically 3 (three) times daily. 08/01/16   Wallis BambergMario Mani, PA-C    Family History Family History  Problem Relation Age of Onset  . Heart Problems Father   . Stroke Maternal Grandfather   . Heart Problems Maternal Grandfather   . Mental illness Paternal Grandmother   . Heart Problems Paternal Grandfather     Social History Social History  Substance Use Topics  . Smoking status: Former Smoker    Quit date: 07/22/2011  . Smokeless tobacco: Former NeurosurgeonUser    Types: Snuff, Chew    Quit date: 07/22/2011  . Alcohol use 3.0 - 6.0 oz/week    6 - 12 Standard drinks or equivalent per week     Comment: social     Allergies   Amoxicillin   Review of Systems Review of Systems  Constitutional: Negative for fever.  Musculoskeletal: Positive for arthralgias.  Neurological: Negative for numbness.     Physical Exam Updated Vital Signs BP 137/70 (BP Location: Right Arm)   Pulse 83   Temp 98.1 F (36.7 C) (Oral)   Resp 18   Ht 5\' 10"  (1.778 m)   Wt 103.9 kg   SpO2 97%   BMI 32.86 kg/m   Physical Exam  Constitutional: He appears well-developed and well-nourished. No distress.  HENT:  Head: Atraumatic.  Eyes: Conjunctivae are normal.  Neck: Neck supple.  Musculoskeletal: He exhibits tenderness (Right ankle/foot: Exquisite tenderness noted to the lateral aspects of ankle and lateral foot extending to fifth MCP with palpation. Area is warm and mildly erythematous with edema. Ankle with full range of motion. Dorsalis pedis pulse palpable.).  Neurological: He is alert.  Skin: No rash noted.  Psychiatric: He has a normal mood and affect.  Nursing note and vitals reviewed.    ED Treatments / Results  Labs (all labs ordered are listed, but only abnormal results are displayed) Labs Reviewed - No data to display  EKG  EKG Interpretation None       Radiology No results  found.  Procedures Procedures (including critical care time)  Medications Ordered in ED Medications - No data to display   Initial Impression / Assessment and Plan / ED Course  I have reviewed the triage vital signs and the nursing notes.  Pertinent labs & imaging results that were available during my care of the patient were reviewed by me and considered in my medical decision making (see chart for details).     BP 137/70 (BP Location: Right Arm)   Pulse 83   Temp 98.1 F (36.7 C) (Oral)   Resp 18   Ht 5\' 10"  (1.778 m)  Wt 103.9 kg   SpO2 97%   BMI 32.86 kg/m    Final Clinical Impressions(s) / ED Diagnoses   Final diagnoses:  Acute gout of right ankle, unspecified cause    New Prescriptions New Prescriptions   INDOMETHACIN (INDOCIN) 50 MG CAPSULE    Take 1 capsule (50 mg total) by mouth 2 (two) times daily with a meal.   PREDNISONE (DELTASONE) 20 MG TABLET    3 tabs po day one, then 2 tabs daily x 4 days   8:32 AM Patient presents with right ankle and right foot pain with finding suggestive of a gouty flare. Low suspicion for septic joint. No recent injury concerning for acute fractures or dislocation. He is neurovascularly intact. I encourage patient to cut down on his red meat intake. I also encouraged patient to hold off on taking allopurinol as it can worsen his gouty flare. He will be discharge with steroid, colchicine and indomethacin as treatment. Return precaution discussed.   Fayrene Helper, PA-C 08/08/16 1610    Margarita Grizzle, MD 08/09/16 1105

## 2016-08-08 NOTE — ED Triage Notes (Signed)
Pt;. Has a hx of Gout. Rt. Ankle is red and swollen also rt. Great toe. Denies any injury. +CNS

## 2016-08-14 ENCOUNTER — Emergency Department (HOSPITAL_COMMUNITY): Payer: BLUE CROSS/BLUE SHIELD

## 2016-08-14 ENCOUNTER — Encounter (HOSPITAL_COMMUNITY): Payer: Self-pay

## 2016-08-14 ENCOUNTER — Emergency Department (HOSPITAL_COMMUNITY)
Admission: EM | Admit: 2016-08-14 | Discharge: 2016-08-14 | Disposition: A | Discharge status: Home / Self Care | Payer: BLUE CROSS/BLUE SHIELD | Attending: Emergency Medicine | Admitting: Emergency Medicine

## 2016-08-14 DIAGNOSIS — J111 Influenza due to unidentified influenza virus with other respiratory manifestations: Secondary | ICD-10-CM | POA: Diagnosis not present

## 2016-08-14 DIAGNOSIS — R05 Cough: Secondary | ICD-10-CM | POA: Diagnosis present

## 2016-08-14 DIAGNOSIS — I10 Essential (primary) hypertension: Secondary | ICD-10-CM | POA: Diagnosis not present

## 2016-08-14 DIAGNOSIS — R69 Illness, unspecified: Secondary | ICD-10-CM

## 2016-08-14 DIAGNOSIS — Z87891 Personal history of nicotine dependence: Secondary | ICD-10-CM | POA: Diagnosis not present

## 2016-08-14 MED ORDER — ACETAMINOPHEN 325 MG PO TABS
650.0000 mg | ORAL_TABLET | Freq: Once | ORAL | Status: AC | PRN
  Administered 2016-08-14: 650 mg via ORAL
  Filled 2016-08-14: qty 2

## 2016-08-14 MED ORDER — ACETAMINOPHEN 500 MG PO TABS: 1000.0000 mg | ORAL_TABLET | Freq: Four times a day (QID) | ORAL | 0 refills | Status: DC | PRN

## 2016-08-14 MED ORDER — OSELTAMIVIR PHOSPHATE 75 MG PO CAPS
75.0000 mg | ORAL_CAPSULE | Freq: Once | ORAL | Status: AC
  Administered 2016-08-14: 75 mg via ORAL
  Filled 2016-08-14: qty 1

## 2016-08-14 MED ORDER — ACETAMINOPHEN 500 MG PO TABS
1000.0000 mg | ORAL_TABLET | Freq: Once | ORAL | Status: AC
  Administered 2016-08-14: 1000 mg via ORAL
  Filled 2016-08-14: qty 2

## 2016-08-14 MED ORDER — ALBUTEROL SULFATE (2.5 MG/3ML) 0.083% IN NEBU
5.0000 mg | INHALATION_SOLUTION | Freq: Once | RESPIRATORY_TRACT | Status: AC
  Administered 2016-08-14: 5 mg via RESPIRATORY_TRACT
  Filled 2016-08-14: qty 6

## 2016-08-14 MED ORDER — OSELTAMIVIR PHOSPHATE 75 MG PO CAPS: 75.0000 mg | ORAL_CAPSULE | Freq: Two times a day (BID) | ORAL | 0 refills | Status: DC

## 2016-08-14 MED ORDER — HYDROCODONE-HOMATROPINE 5-1.5 MG/5ML PO SYRP: 5.0000 mL | ORAL_SOLUTION | Freq: Four times a day (QID) | ORAL | 0 refills | Status: DC | PRN

## 2016-08-14 NOTE — ED Notes (Signed)
EKG done by AH at 1404

## 2016-08-14 NOTE — ED Triage Notes (Signed)
Patient c/o body aches, SOB, non productive cough, fever, and chest pain since yesterday.

## 2016-08-14 NOTE — ED Provider Notes (Signed)
[facility]-EMERGENCY DEPT Provider Note   CSN: [id number] Arrival date & time: 08/14/16  1341     History   Chief Complaint Chief Complaint  Patient presents with  . Shortness of Breath  . Chest Pain  . Cough  . Generalized Body Aches    HPI Nigel BertholdJoseph M Marter is a 41 y.o. male.  HPI Patient reports symptoms started the day before yesterday. He reports he has had body aches and chills. He reports cough with chest pain with coughing. He states the cough has been dry with no mucus production. He states his generalized headache upon which the pain on the top of his head is much worse when he coughs. Has some mild sore throat and nasal congestion association with symptoms. Patient quit smoking approximately 5 years ago. He does still use an albuterol inhaler and a Qvar inhaler. Past Medical History:  Diagnosis Date  . Acid reflux   . Allergy   . Anxiety   . Arthritis   . Bronchitis   . Depression with anxiety   . 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 Hypertension   . Neuromuscular disorder [facility](HCC)     Patient Active Problem List   Diagnosis Date Noted  . Viral URI with cough 06/28/2013  . GERD (gastroesophageal reflux disease) 10/20/2012  . HTN (hypertension) 02/12/2012  . Stuttering 02/12/2012  . GAD (generalized anxiety disorder) 02/12/2012  . Nicotine use disorder 02/12/2012  . Chronic back pain 02/12/2012  . Hip dislocation, left (HCC) 02/12/2012  . Gout 08/04/2011    Past Surgical History:  Procedure Laterality Date  . CORNEAL TRANSPLANT     left eye  . DOBUTAMINE STRESS ECHO  10/12/2008   normal  . left hip surgery    . NASAL SEPTUM SURGERY    . orthopedic left arm surgery     done following an accident  . TONSILLECTOMY         Home Medications    Prior to Admission medications   Medication Sig Start Date End Date Taking? Authorizing Provider  acetaminophen (TYLENOL) 500 MG tablet Take 500-1,000 mg by mouth every 6 (six) hours as needed for  mild pain or headache. Reported on 06/16/2015   Yes Historical Provider, MD  albuterol (PROVENTIL HFA;VENTOLIN HFA) 108 (90 Base) MCG/ACT inhaler Inhale 2 puffs into the lungs every 6 (six) hours as needed for wheezing or shortness of breath (cough, shortness of breath or wheezing.). 05/08/16  Yes Wallis BambergMario Mani, PA-C  ALPRAZolam Prudy Feeler(XANAX) 0.5 MG tablet Take 1 tablet (0.5 mg total) by mouth 3 (three) times daily as needed for anxiety. To follow current prescription 06/26/16  Yes Collie SiadStephanie D English, PA  beclomethasone (QVAR) 80 MCG/ACT inhaler Inhale 2 puffs into the lungs 2 (two) times daily. To use every single day as a prevention 05/01/16  Yes Stephanie D English, PA  buPROPion (WELLBUTRIN XL) 150 MG 24 hr tablet Take 1 tablet (150 mg total) by mouth 2 (two) times daily. 06/26/16  Yes Stephanie D English, PA  fluticasone (FLONASE) 50 MCG/ACT nasal spray Place 2 sprays into both nostrils daily. 05/08/16  Yes Wallis BambergMario Mani, PA-C  indomethacin (INDOCIN) 50 MG capsule Take 1 capsule (50 mg total) by mouth 2 (two) times daily with a meal. 08/08/16  Yes Fayrene HelperBowie Tran, PA-C  metoprolol succinate (TOPROL-XL) 100 MG 24 hr tablet TAKE 1 TABLET (100 MG TOTAL) BY MOUTH DAILY. TAKE WITH OR IMMEDIATELY FOLLOWING A MEAL. 07/12/15  Yes Peyton Najjaravid H Hopper, MD  acetaminophen (TYLENOL) 500 MG tablet Take 2 tablets (1,000 mg total) by mouth every 6 (six) hours as needed. 08/14/16   Arby Barrette, MD  allopurinol (ZYLOPRIM) 100 MG tablet Take 1 tablet (100 mg total) by mouth daily. Patient not taking: Reported on 08/14/2016 12/28/15   Collene Gobble, MD  cetirizine (ZYRTEC) 10 MG tablet Take 1 tablet (10 mg total) by mouth daily. Patient not taking: Reported on 08/01/2016 05/08/16   Wallis Bamberg, PA-C  chlorthalidone (HYGROTON) 25 MG tablet Take 0.5 tablets (12.5 mg total) by mouth daily. Patient not taking: Reported on 08/14/2016 03/23/16   Doyle Askew, FNP  clindamycin (CLINDAGEL) 1 % gel Apply topically 2 (two) times  daily. Patient not taking: Reported on 08/14/2016 08/01/16   Wallis Bamberg, PA-C  colchicine 0.6 MG tablet 1.2 mg at the first sign of flare, followed in 1 hour with a single dose of 0.6 mg (maximum: 1.8 mg within 1 hour).  Repeat as needed. 08/08/16   Fayrene Helper, PA-C  HYDROcodone-homatropine (HYCODAN) 5-1.5 MG/5ML syrup Take 5 mLs by mouth every 6 (six) hours as needed for cough. May take 5-54mL for cough every 6 hours as needed. 08/14/16   Arby Barrette, MD  naproxen (NAPROSYN) 500 MG tablet Take 1 tablet (500 mg total) by mouth 2 (two) times daily with a meal. Patient not taking: Reported on 08/14/2016 03/25/16   Doyle Askew, FNP  nystatin (MYCOSTATIN/NYSTOP) powder Apply topically 3 (three) times daily. Patient not taking: Reported on 08/14/2016 08/01/16   Wallis Bamberg, PA-C  oseltamivir (TAMIFLU) 75 MG capsule Take 1 capsule (75 mg total) by mouth every 12 (twelve) hours. 08/14/16   Arby Barrette, MD  predniSONE (DELTASONE) 20 MG tablet 3 tabs po day one, then 2 tabs daily x 4 days Patient not taking: Reported on 08/14/2016 08/08/16   Fayrene Helper, PA-C    Family History Family History  Problem Relation Age of Onset  . Heart Problems Father   . Stroke Maternal Grandfather   . Heart Problems Maternal Grandfather   . Mental illness Paternal Grandmother   . Heart Problems Paternal Grandfather     Social History Social History  Substance Use Topics  . Smoking status: Former Smoker    Quit date: 07/22/2011  . Smokeless tobacco: Former Neurosurgeon    Types: Snuff, Chew    Quit date: 07/22/2011  . Alcohol use 3.0 - 6.0 oz/week    6 - 12 Standard drinks or equivalent per week     Comment: social     Allergies   Amoxicillin   Review of Systems Review of Systems 10 Systems reviewed and are negative for acute change except as noted in the HPI.   Physical Exam Updated Vital Signs BP 146/75 (BP Location: Left Arm)   Pulse 76   Temp 98.2 F (36.8 C) (Oral)   Resp 20   Ht 5\' 10"  (1.778  m)   Wt 230 lb (104.3 kg)   SpO2 94%   BMI 33.00 kg/m   Physical Exam  Constitutional: He appears well-developed and well-nourished.  HENT:  Head: Normocephalic and atraumatic.  Mouth/Throat: Oropharynx is clear and moist.  Eyes: Conjunctivae and EOM are normal.  Neck: Neck supple.  Cardiovascular: Normal rate and regular rhythm.   No murmur heard. Pulmonary/Chest: Effort normal. No respiratory distress. He has wheezes.  Exotropia wheeze with forced expiratory exhalation  Abdominal: Soft. He exhibits no distension. There is no tenderness.  Musculoskeletal: He exhibits no edema.  Neurological: He is  alert. He exhibits normal muscle tone. Coordination normal.  Skin: Skin is warm and dry.  Psychiatric: He has a normal mood and affect.  Nursing note and vitals reviewed.    ED Treatments / Results  Labs (all labs ordered are listed, but only abnormal results are displayed) Labs Reviewed - No data to display  EKG  EKG Interpretation None       Radiology Dg Chest 2 View  Result Date: 08/14/2016 CLINICAL DATA:  Shortness of breath, cough. EXAM: CHEST  2 VIEW COMPARISON:  Radiographs of May 15, 2016. FINDINGS: The heart size and mediastinal contours are within normal limits. Both lungs are clear. No pneumothorax or pleural effusion is noted. The visualized skeletal structures are unremarkable. IMPRESSION: No active cardiopulmonary disease. Electronically Signed   By: Lupita Raider, M.D.   On: 08/14/2016 14:59    Procedures Procedures (including critical care time)  Medications Ordered in ED Medications  oseltamivir (TAMIFLU) capsule 75 mg (not administered)  acetaminophen (TYLENOL) tablet 1,000 mg (not administered)  acetaminophen (TYLENOL) tablet 650 mg (650 mg Oral Given 08/14/16 1419)  albuterol (PROVENTIL) (2.5 MG/3ML) 0.083% nebulizer solution 5 mg (5 mg Nebulization Given 08/14/16 1419)     Initial Impression / Assessment and Plan / ED Course  I have  reviewed the triage vital signs and the nursing notes.  Pertinent labs & imaging results that were available during my care of the patient were reviewed by me and considered in my medical decision making (see chart for details).      Final Clinical Impressions(s) / ED Diagnoses   Final diagnoses:  Influenza-like illness   Patient is alert and nontoxic. He does not have respiratory distress. He does have history of tobacco use and is chronically maintenance on albuterol and Qvar. Patient will be treated with Tamiflu for symptoms are very suggestive of influenza. He will continue to use his home inhalers and is given Hycodan for cough. New Prescriptions New Prescriptions   ACETAMINOPHEN (TYLENOL) 500 MG TABLET    Take 2 tablets (1,000 mg total) by mouth every 6 (six) hours as needed.   HYDROCODONE-HOMATROPINE (HYCODAN) 5-1.5 MG/5ML SYRUP    Take 5 mLs by mouth every 6 (six) hours as needed for cough. May take 5-92mL for cough every 6 hours as needed.   OSELTAMIVIR (TAMIFLU) 75 MG CAPSULE    Take 1 capsule (75 mg total) by mouth every 12 (twelve) hours.     Arby Barrette, MD 08/14/16 2221

## 2016-08-21 ENCOUNTER — Ambulatory Visit (INDEPENDENT_AMBULATORY_CARE_PROVIDER_SITE_OTHER): Payer: BLUE CROSS/BLUE SHIELD | Admitting: Family Medicine

## 2016-08-21 ENCOUNTER — Ambulatory Visit (INDEPENDENT_AMBULATORY_CARE_PROVIDER_SITE_OTHER): Payer: BLUE CROSS/BLUE SHIELD

## 2016-08-21 VITALS — BP 152/78 | HR 55 | Temp 98.8°F | Resp 20 | Ht 70.0 in | Wt 223.0 lb

## 2016-08-21 DIAGNOSIS — I10 Essential (primary) hypertension: Secondary | ICD-10-CM

## 2016-08-21 DIAGNOSIS — F411 Generalized anxiety disorder: Secondary | ICD-10-CM

## 2016-08-21 DIAGNOSIS — J209 Acute bronchitis, unspecified: Secondary | ICD-10-CM

## 2016-08-21 DIAGNOSIS — R059 Cough, unspecified: Secondary | ICD-10-CM

## 2016-08-21 DIAGNOSIS — J4521 Mild intermittent asthma with (acute) exacerbation: Secondary | ICD-10-CM | POA: Diagnosis not present

## 2016-08-21 DIAGNOSIS — R509 Fever, unspecified: Secondary | ICD-10-CM

## 2016-08-21 DIAGNOSIS — R05 Cough: Secondary | ICD-10-CM

## 2016-08-21 LAB — POCT INFLUENZA A/B
INFLUENZA A, POC: NEGATIVE
INFLUENZA B, POC: NEGATIVE

## 2016-08-21 MED ORDER — PREDNISONE 20 MG PO TABS: 40.0000 mg | ORAL_TABLET | Freq: Every day | ORAL | 0 refills | Status: AC

## 2016-08-21 MED ORDER — AZITHROMYCIN 250 MG PO TABS: ORAL_TABLET | ORAL | 0 refills | Status: DC

## 2016-08-21 MED ORDER — BUPROPION HCL ER (XL) 150 MG PO TB24: 150.0000 mg | ORAL_TABLET | Freq: Two times a day (BID) | ORAL | 11 refills | Status: DC

## 2016-08-21 MED ORDER — ALBUTEROL SULFATE (2.5 MG/3ML) 0.083% IN NEBU
2.5000 mg | INHALATION_SOLUTION | Freq: Once | RESPIRATORY_TRACT | Status: AC
  Administered 2016-08-21: 2.5 mg via RESPIRATORY_TRACT

## 2016-08-21 NOTE — Patient Instructions (Signed)
     IF you received an x-ray today, you will receive an invoice from [facility]. Please contact [facility] at [phone] with questions or concerns regarding your invoice.   IF you received labwork today, you will receive an invoice from [facility]. Please contact [facility] at [phone] with questions or concerns regarding your invoice.   Our billing staff will not be able to assist you with questions regarding bills from these companies.  You will be contacted with the lab results as soon as they are available. The fastest way to get your results is to activate your My Chart account. Instructions are located on the last page of this paperwork. If you have not heard from us regarding the results in 2 weeks, please contact this office.     

## 2016-08-21 NOTE — Progress Notes (Signed)
Chief Complaint  Patient presents with  . Bronchitis    x 1 week  . Hot Flashes  . Fever    100.6 per pt   . Medication Refill    Wellbutrin XL    HPI   Pt reports that he has been having coughing with shortness of breath for one week Reports that he gets hot spells and had a fever of 100.6 He also gets the chills His last fever was about 4 days ago He states that his daughter and girlfriend are sick. His daughter is 628 months old.  He has a history of asthma with last asthma attack 2 days ago He used his albuterol inhaler 4 or 5 times a day since getting this illness He reports that he normal uses his albuterol once a week.  He is a former smoker He took a tylenol this morning   He also needs refills of Wellbutrin XL He takes the wellbutrin for anxiety He reports that he is running low on his wellbutrin but has not missed any doses   He reports that he usually takes his blood pressure medication at night time He takes chlorthalidone and metoprolol and took his usual dose last night BP Readings from Last 3 Encounters:  08/21/16 (!) 152/78  08/14/16 146/75  08/08/16 137/70   He denies chest pains and palpitations He is taking spme medication that would raise his blood pressures He took mucinex for cough and decongestion  Past Medical History:  Diagnosis Date  . Acid reflux   . Allergy   . Anxiety   . Arthritis   . Bronchitis   . Depression with anxiety   . 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 Hypertension   . Neuromuscular disorder [facility](HCC)     Current Outpatient Prescriptions  Medication Sig Dispense Refill  . acetaminophen (TYLENOL) 500 MG tablet Take 2 tablets (1,000 mg total) by mouth every 6 (six) hours as needed. 30 tablet 0  . albuterol (PROVENTIL HFA;VENTOLIN HFA) 108 (90 Base) MCG/ACT inhaler Inhale 2 puffs into the lungs every 6 (six) hours as needed for wheezing or shortness of breath (cough, shortness of breath or wheezing.). 1 Inhaler 11   . allopurinol (ZYLOPRIM) 100 MG tablet Take 1 tablet (100 mg total) by mouth daily. 30 tablet 11  . ALPRAZolam (XANAX) 0.5 MG tablet Take 1 tablet (0.5 mg total) by mouth 3 (three) times daily as needed for anxiety. To follow current prescription 90 tablet 5  . beclomethasone (QVAR) 80 MCG/ACT inhaler Inhale 2 puffs into the lungs 2 (two) times daily. To use every single day as a prevention 3 Inhaler 2  . buPROPion (WELLBUTRIN XL) 150 MG 24 hr tablet Take 1 tablet (150 mg total) by mouth 2 (two) times daily. 60 tablet 11  . chlorthalidone (HYGROTON) 25 MG tablet Take 0.5 tablets (12.5 mg total) by mouth daily. 35 tablet 0  . colchicine 0.6 MG tablet 1.2 mg at the first sign of flare, followed in 1 hour with a single dose of 0.6 mg (maximum: 1.8 mg within 1 hour).  Repeat as needed. 12 tablet 0  . fluticasone (FLONASE) 50 MCG/ACT nasal spray Place 2 sprays into both nostrils daily. 16 g 11  . indomethacin (INDOCIN) 50 MG capsule Take 1 capsule (50 mg total) by mouth 2 (two) times daily with a meal. 30 capsule 0  . metoprolol succinate (TOPROL-XL) 100 MG 24 hr tablet TAKE 1 TABLET (100 MG TOTAL) BY  MOUTH DAILY. TAKE WITH OR IMMEDIATELY FOLLOWING A MEAL. 90 tablet 0  . naproxen (NAPROSYN) 500 MG tablet Take 1 tablet (500 mg total) by mouth 2 (two) times daily with a meal. 30 tablet 0  . azithromycin (ZITHROMAX) 250 MG tablet Take 2 tablets on day one then one tablet each day after. 6 tablet 0  . predniSONE (DELTASONE) 20 MG tablet Take 2 tablets (40 mg total) by mouth daily with breakfast. 10 tablet 0   No current facility-administered medications for this visit.     Allergies:  Allergies  Allergen Reactions  . Amoxicillin Hives    Has patient had a PCN reaction causing immediate rash, facial/tongue/throat swelling, SOB or lightheadedness with hypotension: no Has patient had a PCN reaction causing severe rash involving mucus membranes or skin necrosis: no Has patient had a PCN reaction that  required hospitalization: on Has patient had a PCN reaction occurring within the last 10 years: no If all of the above answers are "NO", then may proceed with Cephalosporin use.     Past Surgical History:  Procedure Laterality Date  . CORNEAL TRANSPLANT     left eye  . DOBUTAMINE STRESS ECHO  10/12/2008   normal  . left hip surgery    . NASAL SEPTUM SURGERY    . orthopedic left arm surgery     done following an accident  . TONSILLECTOMY      Social History   Social History  . Marital status: Single    Spouse name: N/A  . Number of children: N/A  . Years of education: N/A   Social History Main Topics  . Smoking status: Former Smoker    Quit date: 07/22/2011  . Smokeless tobacco: Former Neurosurgeon    Types: Snuff, Chew    Quit date: 07/22/2011  . Alcohol use 3.0 - 6.0 oz/week    6 - 12 Standard drinks or equivalent per week     Comment: social  . Drug use: No  . Sexual activity: Yes    Birth control/ protection: None   Other Topics Concern  . None   Social History Narrative  . None    Review of Systems  Constitutional:       See hpi  HENT: Negative for hearing loss and tinnitus.   Cardiovascular: Positive for chest pain. Negative for palpitations.  Gastrointestinal: Negative for heartburn, nausea and vomiting.  Genitourinary: Negative for dysuria and urgency.  Skin: Negative for itching and rash.  Neurological: Negative for dizziness and headaches.   See hpi  Objective: Vitals:   08/21/16 0957  BP: (!) 152/78  Pulse: (!) 55  Resp: 20  Temp: 98.8 F (37.1 C)  TempSrc: Oral  SpO2: 96%  Weight: 223 lb (101.2 kg)  Height: 5\' 10"  (1.778 m)    Physical Exam  General: alert, oriented, in NAD Head: normocephalic, atraumatic, no sinus tenderness Eyes: EOM intact, no scleral icterus or conjunctival injection Ears: TM clear bilaterally Throat: no pharyngeal exudate or erythema Lymph: no posterior auricular, submental or cervical lymph adenopathy Heart:  normal rate, normal sinus rhythm, no murmurs Lungs: wheezing diffusely  After nebulizer pt wheezing in the base of the lungs  CXR - no consolidation or effusion   Assessment and Plan Louay was seen today for bronchitis, hot flashes, fever and medication refill.  Diagnoses and all orders for this visit:  Cough -     DG Chest 2 View; Future  Acute bronchitis, unspecified organism- will treat with zpak -  DG Chest 2 View; Future -     azithromycin (ZITHROMAX) 250 MG tablet; Take 2 tablets on day one then one tablet each day after.  Generalized anxiety disorder-   Anxiety stable on current meds, refilled today -     buPROPion (WELLBUTRIN XL) 150 MG 24 hr tablet; Take 1 tablet (150 mg total) by mouth 2 (two) times daily.  Essential hypertension- bp elevated, will check in one week when symptoms are improving  Fever and chills- influenza negative -     POCT Influenza A/B  Mild intermittent asthma with acute exacerbation- gave prednisone 40mg  daily for 5 days -     POCT Influenza A/B -     albuterol (PROVENTIL) (2.5 MG/3ML) 0.083% nebulizer solution 2.5 mg; Take 3 mLs (2.5 mg total) by nebulization once -     predniSONE (DELTASONE) 20 MG tablet; Take 2 tablets (40 mg total) by mouth daily with breakfast.      Christopherjame Carnell A Creta Levin

## 2016-08-28 ENCOUNTER — Ambulatory Visit: Payer: BLUE CROSS/BLUE SHIELD

## 2016-10-27 ENCOUNTER — Encounter (HOSPITAL_COMMUNITY): Payer: Self-pay | Admitting: *Deleted

## 2016-10-27 ENCOUNTER — Emergency Department (HOSPITAL_COMMUNITY)
Admission: EM | Admit: 2016-10-27 | Discharge: 2016-10-27 | Disposition: A | Discharge status: Home / Self Care | Payer: BLUE CROSS/BLUE SHIELD | Attending: Emergency Medicine | Admitting: Emergency Medicine

## 2016-10-27 DIAGNOSIS — J45909 Unspecified asthma, uncomplicated: Secondary | ICD-10-CM | POA: Diagnosis not present

## 2016-10-27 DIAGNOSIS — I10 Essential (primary) hypertension: Secondary | ICD-10-CM | POA: Insufficient documentation

## 2016-10-27 DIAGNOSIS — M5442 Lumbago with sciatica, left side: Secondary | ICD-10-CM | POA: Diagnosis not present

## 2016-10-27 DIAGNOSIS — G8929 Other chronic pain: Secondary | ICD-10-CM | POA: Diagnosis not present

## 2016-10-27 DIAGNOSIS — M545 Low back pain: Secondary | ICD-10-CM | POA: Diagnosis present

## 2016-10-27 DIAGNOSIS — Z79899 Other long term (current) drug therapy: Secondary | ICD-10-CM | POA: Insufficient documentation

## 2016-10-27 DIAGNOSIS — Z87891 Personal history of nicotine dependence: Secondary | ICD-10-CM | POA: Insufficient documentation

## 2016-10-27 HISTORY — DX: Unspecified asthma, uncomplicated: J45.909

## 2016-10-27 LAB — URINALYSIS, ROUTINE W REFLEX MICROSCOPIC
Bilirubin Urine: NEGATIVE
GLUCOSE, UA: NEGATIVE mg/dL
Ketones, ur: 5 mg/dL — AB
LEUKOCYTES UA: NEGATIVE
NITRITE: NEGATIVE
PH: 5 (ref 5.0–8.0)
PROTEIN: NEGATIVE mg/dL
SPECIFIC GRAVITY, URINE: 1.024 (ref 1.005–1.030)
Squamous Epithelial / LPF: NONE SEEN

## 2016-10-27 MED ORDER — HYDROCODONE-ACETAMINOPHEN 5-325 MG PO TABS: 1.0000 | ORAL_TABLET | Freq: Four times a day (QID) | ORAL | 0 refills | Status: DC | PRN

## 2016-10-27 MED ORDER — METHYLPREDNISOLONE 4 MG PO TBPK: ORAL_TABLET | ORAL | 0 refills | Status: DC

## 2016-10-27 MED ORDER — DEXAMETHASONE SODIUM PHOSPHATE 10 MG/ML IJ SOLN
10.0000 mg | Freq: Once | INTRAMUSCULAR | Status: AC
  Administered 2016-10-27: 10 mg via INTRAMUSCULAR
  Filled 2016-10-27: qty 1

## 2016-10-27 MED ORDER — KETOROLAC TROMETHAMINE 30 MG/ML IJ SOLN
30.0000 mg | Freq: Once | INTRAMUSCULAR | Status: AC
  Administered 2016-10-27: 30 mg via INTRAMUSCULAR
  Filled 2016-10-27: qty 1

## 2016-10-27 MED ORDER — OXYCODONE-ACETAMINOPHEN 5-325 MG PO TABS
1.0000 | ORAL_TABLET | Freq: Once | ORAL | Status: AC
  Administered 2016-10-27: 1 via ORAL
  Filled 2016-10-27: qty 1
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
## 2016-10-27 NOTE — Discharge Instructions (Signed)
It was my pleasure taking care of you today!   Continue Aleve or ibuprofen as needed for mild to moderate pain. Pain medication only as needed for severe pain This can make you very drowsy - please do not drink alcohol, operate heavy machinery or drive on this medication.  Please follow up with the neurosurgery clinic listed - you will need to call them to schedule this appointment.   Your back pain should get better over the next 2 weeks. Please follow up with your doctor this week for a recheck if still having symptoms. If you develop severe or worsening pain, low back pain with fever, numbness, weakness or inability to walk or urinate, you should return to the ER immediately.   COLD THERAPY DIRECTIONS:  Ice or gel packs can be used to reduce both pain and swelling. Ice is the most helpful within the first 24 to 48 hours after an injury or flareup from overusing a muscle or joint.  Ice is effective, has very few side effects, and is safe for most people to use.   If you expose your skin to cold temperatures for too long or without the proper protection, you can damage your skin or nerves. Watch for signs of skin damage due to cold.   HOME CARE INSTRUCTIONS  Follow these tips to use ice and cold packs safely.  Place a dry or damp towel between the ice and skin. A damp towel will cool the skin more quickly, so you may need to shorten the time that the ice is used.  For a more rapid response, add gentle compression to the ice.  Ice for no more than 10 to 20 minutes at a time. The bonier the area you are icing, the less time it will take to get the benefits of ice.  Check your skin after 5 minutes to make sure there are no signs of a poor response to cold or skin damage.  Rest 20 minutes or more in between uses.  Once your skin is numb, you can end your treatment. You can test numbness by very lightly touching your skin. The touch should be so light that you do not see the skin dimple from the  pressure of your fingertip. When using ice, most people will feel these normal sensations in this order: cold, burning, aching, and numbness.    Be aware that if you develop new symptoms, such as a fever, leg weakness, difficulty with or loss of control of your urine or bowels, abdominal pain, or more severe pain, you will need to seek medical attention and  / or return to the Emergency department.

## 2016-10-27 NOTE — ED Triage Notes (Signed)
Pt presents with back pain x 2 weeks and a hx of degenerative disc disease.  Pt denies any recent injuries.  Pt a/o x 4 and ambulatory in triage.  Rates pain a 9 out of 10.

## 2016-12-16 ENCOUNTER — Other Ambulatory Visit: Payer: Self-pay | Admitting: Neurosurgery

## 2016-12-16 DIAGNOSIS — M5442 Lumbago with sciatica, left side: Secondary | ICD-10-CM

## 2016-12-25 ENCOUNTER — Ambulatory Visit (INDEPENDENT_AMBULATORY_CARE_PROVIDER_SITE_OTHER): Payer: BLUE CROSS/BLUE SHIELD | Admitting: Family Medicine

## 2016-12-25 ENCOUNTER — Encounter: Payer: Self-pay | Admitting: Family Medicine

## 2016-12-25 VITALS — BP 140/79 | HR 85 | Temp 97.8°F | Resp 16 | Ht 70.0 in | Wt 221.0 lb

## 2016-12-25 DIAGNOSIS — Z5181 Encounter for therapeutic drug level monitoring: Secondary | ICD-10-CM | POA: Diagnosis not present

## 2016-12-25 DIAGNOSIS — I10 Essential (primary) hypertension: Secondary | ICD-10-CM | POA: Diagnosis not present

## 2016-12-25 DIAGNOSIS — F8081 Childhood onset fluency disorder: Secondary | ICD-10-CM | POA: Diagnosis not present

## 2016-12-25 DIAGNOSIS — M1A09X0 Idiopathic chronic gout, multiple sites, without tophus (tophi): Secondary | ICD-10-CM | POA: Diagnosis not present

## 2016-12-25 DIAGNOSIS — F411 Generalized anxiety disorder: Secondary | ICD-10-CM | POA: Diagnosis not present

## 2016-12-25 DIAGNOSIS — J4521 Mild intermittent asthma with (acute) exacerbation: Secondary | ICD-10-CM

## 2016-12-25 MED ORDER — INDOMETHACIN 50 MG PO CAPS: 50.0000 mg | ORAL_CAPSULE | Freq: Two times a day (BID) | ORAL | 6 refills | Status: DC

## 2016-12-25 MED ORDER — ALPRAZOLAM 0.5 MG PO TABS: 0.5000 mg | ORAL_TABLET | Freq: Three times a day (TID) | ORAL | 5 refills | Status: DC | PRN

## 2016-12-25 MED ORDER — ALBUTEROL SULFATE HFA 108 (90 BASE) MCG/ACT IN AERS: 2.0000 | INHALATION_SPRAY | Freq: Four times a day (QID) | RESPIRATORY_TRACT | 11 refills | Status: DC | PRN

## 2016-12-25 NOTE — Assessment & Plan Note (Signed)
Refilled xanax today Continue wellbutrin

## 2016-12-25 NOTE — Assessment & Plan Note (Signed)
Stable on current medications, continue hydration in the hot summer months

## 2016-12-25 NOTE — Assessment & Plan Note (Signed)
Asthma improved Continue avoidance of triggers Refilled albuterol inhaler

## 2016-12-25 NOTE — Assessment & Plan Note (Signed)
Refilled indomethacin Stable with avoidance of triggers Pt also has colchicine prn

## 2016-12-25 NOTE — Progress Notes (Signed)
Chief Complaint  Patient presents with  . Medication Refill    albuterol, alprazolam,indocin    HPI  Asthma He reports that he gets a rare wheezing at night that resolves with his albuterol inhaler He reports that it happens every couple of weeks He feels like his asthma is doing well His asthma triggers are colds, virus, animal dander and pollen   Anxiety  He reports that he is taking his anxiety medications  He is taking buproprion for anxiety and xanax as needed  He takes his dose of 0.5mg  tid  Gout He reports that he needs refills of the indomethacin He states that he is doing well now He gets a flare once a month for a day or two and can feel a slight inflammation  He states that he is doing well overall  He is scheduled for an MRI for her degenerative disc disease and follow up with Neurology next Thursday   Hypertension He reports that he is taking chlorthalidone and is drinking plenty of water on the hot summer days He is taking metoprolol as well He denies dizziness, palpitations, headaches or chest pains BP Readings from Last 3 Encounters:  12/25/16 140/79  10/27/16 (!) 156/99  08/21/16 (!) 152/78     Past Medical History:  Diagnosis Date  . Acid reflux   . Allergy   . Anxiety   . Arthritis   . Asthma   . Bronchitis   . Depression with anxiety   . 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 Hypertension   . Neuromuscular disorder [facility])     Current Outpatient Prescriptions  Medication Sig Dispense Refill  . acetaminophen (TYLENOL) 500 MG tablet Take 2 tablets (1,000 mg total) by mouth every 6 (six) hours as needed. 30 tablet 0  . albuterol (PROVENTIL HFA;VENTOLIN HFA) 108 (90 Base) MCG/ACT inhaler Inhale 2 puffs into the lungs every 6 (six) hours as needed for wheezing or shortness of breath (cough, shortness of breath or wheezing.). 1 Inhaler 11  . allopurinol (ZYLOPRIM) 100 MG tablet Take 1 tablet (100 mg total) by mouth daily. 30 tablet 11    . ALPRAZolam (XANAX) 0.5 MG tablet Take 1 tablet (0.5 mg total) by mouth 3 (three) times daily as needed for anxiety. To follow current prescription 90 tablet 5  . beclomethasone (QVAR) 80 MCG/ACT inhaler Inhale 2 puffs into the lungs 2 (two) times daily. To use every single day as a prevention 3 Inhaler 2  . buPROPion (WELLBUTRIN XL) 150 MG 24 hr tablet Take 1 tablet (150 mg total) by mouth 2 (two) times daily. 60 tablet 11  . chlorthalidone (HYGROTON) 25 MG tablet Take 0.5 tablets (12.5 mg total) by mouth daily. 35 tablet 0  . indomethacin (INDOCIN) 50 MG capsule Take 1 capsule (50 mg total) by mouth 2 (two) times daily with a meal. 30 capsule 6  . methylPREDNISolone (MEDROL DOSEPAK) 4 MG TBPK tablet Take as directed. 1 tablet 0  . metoprolol succinate (TOPROL-XL) 100 MG 24 hr tablet TAKE 1 TABLET (100 MG TOTAL) BY MOUTH DAILY. TAKE WITH OR IMMEDIATELY FOLLOWING A MEAL. 90 tablet 0  . colchicine 0.6 MG tablet 1.2 mg at the first sign of flare, followed in 1 hour with a single dose of 0.6 mg (maximum: 1.8 mg within 1 hour).  Repeat as needed. (Patient not taking: Reported on 12/25/2016) 12 tablet 0   No current facility-administered medications for this visit.     Allergies:  Allergies  Allergen Reactions  . Amoxicillin Hives    Has patient had a PCN reaction causing immediate rash, facial/tongue/throat swelling, SOB or lightheadedness with hypotension: no Has patient had a PCN reaction causing severe rash involving mucus membranes or skin necrosis: no Has patient had a PCN reaction that required hospitalization: on Has patient had a PCN reaction occurring within the last 10 years: no If all of the above answers are "NO", then may proceed with Cephalosporin use.     Past Surgical History:  Procedure Laterality Date  . CORNEAL TRANSPLANT     left eye  . DOBUTAMINE STRESS ECHO  10/12/2008   normal  . left hip surgery    . NASAL SEPTUM SURGERY    . orthopedic left arm surgery     done  following an accident  . TONSILLECTOMY      Social History   Social History  . Marital status: Single    Spouse name: N/A  . Number of children: N/A  . Years of education: N/A   Social History Main Topics  . Smoking status: Former Smoker    Quit date: 07/22/2011  . Smokeless tobacco: Former NeurosurgeonUser    Types: Snuff, Chew    Quit date: 07/22/2011  . Alcohol use 3.0 - 6.0 oz/week    6 - 12 Standard drinks or equivalent per week     Comment: social  . Drug use: No  . Sexual activity: Yes    Birth control/ protection: None   Other Topics Concern  . None   Social History Narrative  . None    ROS See hpi  Objective: Vitals:   12/25/16 1127  BP: 140/79  Pulse: 85  Resp: 16  Temp: 97.8 F (36.6 C)  TempSrc: Oral  SpO2: 98%  Weight: 221 lb (100.2 kg)  Height: 5\' 10"  (1.778 m)    Physical Exam  Constitutional: He is oriented to person, place, and time. He appears well-developed and well-nourished.  HENT:  Head: Normocephalic and atraumatic.  Eyes: Conjunctivae and EOM are normal.  Cardiovascular: Normal rate, regular rhythm and normal heart sounds.   No murmur heard. Pulmonary/Chest: Effort normal and breath sounds normal. No respiratory distress. He has no wheezes.  Neurological: He is alert and oriented to person, place, and time.  Psychiatric: He has a normal mood and affect. His behavior is normal. Judgment and thought content normal.     Assessment and Plan  Problem List Items Addressed This Visit      Cardiovascular and Mediastinum   Essential hypertension    Stable on current medications, continue hydration in the hot summer months      Relevant Orders   Comprehensive metabolic panel   Lipid panel     Respiratory   Mild intermittent asthma with acute exacerbation    Asthma improved Continue avoidance of triggers Refilled albuterol inhaler      Relevant Medications   albuterol (PROVENTIL HFA;VENTOLIN HFA) 108 (90 Base) MCG/ACT inhaler      Other   Gout of multiple sites    Refilled indomethacin Stable with avoidance of triggers Pt also has colchicine prn      Relevant Medications   indomethacin (INDOCIN) 50 MG capsule   Other Relevant Orders   Comprehensive metabolic panel   Stuttering   Relevant Medications   ALPRAZolam (XANAX) 0.5 MG tablet   GAD (generalized anxiety disorder) - Primary    Refilled xanax today Continue wellbutrin      Relevant Medications  ALPRAZolam (XANAX) 0.5 MG tablet    Other Visit Diagnoses    Encounter for medication monitoring       Relevant Orders   Comprehensive metabolic panel   Lipid panel       Asha Grumbine A Creta Levin

## 2016-12-25 NOTE — Patient Instructions (Addendum)
   IF you received an x-ray today, you will receive an invoice from [facility]. Please contact [facility] at [phone] with questions or concerns regarding your invoice.   IF you received labwork today, you will receive an invoice from [facility]. Please contact [facility] at [phone] with questions or concerns regarding your invoice.   Our billing staff will not be able to assist you with questions regarding bills from these companies.  You will be contacted with the lab results as soon as they are available. The fastest way to get your results is to activate your My Chart account. Instructions are located on the last page of this paperwork. If you have not heard from us regarding the results in 2 weeks, please contact this office.     Asthma Attack Prevention, Adult Although you may not be able to control the fact that you have asthma, you can take actions to prevent episodes of asthma (asthma attacks). These actions include:  Creating a written plan for managing and treating your asthma attacks (asthma action plan).  Monitoring your asthma.  Avoiding things that can irritate your airways or make your asthma symptoms worse (asthma triggers).  Taking your medicines as directed.  Acting quickly if you have signs or symptoms of an asthma attack.  What are some ways to prevent an asthma attack? Create a plan Work with your health care provider to create an asthma action plan. This plan should include:  A list of your asthma triggers and how to avoid them.  A list of symptoms that you experience during an asthma attack.  Information about when to take medicine and how much medicine to take.  Information to help you understand your peak flow measurements.  Contact information for your health care providers.  Daily actions that you can take to control asthma.  Monitor your asthma  To monitor your asthma:  Use your peak flow meter every morning and  every evening for 2-3 weeks. Record the results in a journal. A drop in your peak flow numbers on one or more days may mean that you are starting to have an asthma attack, even if you are not having symptoms.  When you have asthma symptoms, write them down in a journal.  Avoid asthma triggers  Work with your health care provider to find out what your asthma triggers are. This can be done by:  Being tested for allergies.  Keeping a journal that notes when asthma attacks occur and what may have contributed to them.  Asking your health care provider whether other medical conditions make your asthma worse.  Common asthma triggers include:  Dust.  Smoke. This includes campfire smoke and secondhand smoke from tobacco products.  Pet dander.  Trees, grasses or pollens.  Very cold, dry, or humid air.  Mold.  Foods that contain high amounts of sulfites.  Strong smells.  Engine exhaust and air pollution.  Aerosol sprays and fumes from household cleaners.  Household pests and their droppings, including dust mites and cockroaches.  Certain medicines, including NSAIDs.  Once you have determined your asthma triggers, take steps to avoid them. Depending on your triggers, you may be able to reduce the chance of an asthma attack by:  Keeping your home clean. Have someone dust and vacuum your home for you 1 or 2 times a week. If possible, have them use a high-efficiency particulate arrestance (HEPA) vacuum.  Washing your sheets weekly in hot water.  Using allergy-proof mattress covers and casings   on your bed.  Keeping pets out of your home.  Taking care of mold and water problems in your home.  Avoiding areas where people smoke.  Avoiding using strong perfumes or odor sprays.  Avoid spending a lot of time outdoors when pollen counts are high and on very windy days.  Talking with your health care provider before stopping or starting any new medicines.  Medicines Take  over-the-counter and prescription medicines only as told by your health care provider. Many asthma attacks can be prevented by carefully following your medicine schedule. Taking your medicines correctly is especially important when you cannot avoid certain asthma triggers. Even if you are doing well, do not stop taking your medicine and do not take less medicine. Act quickly If an asthma attack happens, acting quickly can decrease how severe it is and how long it lasts. Take these actions:  Pay attention to your symptoms. If you are coughing, wheezing, or having difficulty breathing, do not wait to see if your symptoms go away on their own. Follow your asthma action plan.  If you have followed your asthma action plan and your symptoms are not improving, call your health care provider or seek immediate medical care at the nearest hospital.  It is important to write down how often you need to use your fast-acting rescue inhaler. You can track how often you use an inhaler in your journal. If you are using your rescue inhaler more often, it may mean that your asthma is not under control. Adjusting your asthma treatment plan may help you to prevent future asthma attacks and help you to gain better control of your condition. How can I prevent an asthma attack when I exercise?  Exercise is a common asthma trigger. To prevent asthma attacks during exercise:  Follow advice from your health care provider about whether you should use your fast-acting inhaler before exercising. Many people with asthma experience exercise-induced bronchoconstriction (EIB). This condition often worsens during vigorous exercise in cold, humid, or dry environments. Usually, people with EIB can stay very active by using a fast-acting inhaler before exercising.  Avoid exercising outdoors in very cold or humid weather.  Avoid exercising outdoors when pollen counts are high.  Warm up and cool down when exercising.  Stop exercising  right away if asthma symptoms start.  Consider taking part in exercises that are less likely to cause asthma symptoms such as:  Indoor swimming.  Biking.  Walking.  Hiking.  Playing football.  This information is not intended to replace advice given to you by your health care provider. Make sure you discuss any questions you have with your health care provider. Document Released: 06/03/2009 Document Revised: 02/14/2016 Document Reviewed: 11/30/2015 Elsevier Interactive Patient Education  2017 [employer]  

## 2016-12-26 LAB — COMPREHENSIVE METABOLIC PANEL
A/G RATIO: 2.9 — AB (ref 1.2–2.2)
ALK PHOS: 66 IU/L (ref 39–117)
ALT: 18 IU/L (ref 0–44)
AST: 14 IU/L (ref 0–40)
Albumin: 4.3 g/dL (ref 3.5–5.5)
BILIRUBIN TOTAL: 0.4 mg/dL (ref 0.0–1.2)
BUN / CREAT RATIO: 15 (ref 9–20)
BUN: 13 mg/dL (ref 6–24)
CHLORIDE: 105 mmol/L (ref 96–106)
CO2: 21 mmol/L (ref 20–29)
Calcium: 9.5 mg/dL (ref 8.7–10.2)
Creatinine, Ser: 0.87 mg/dL (ref 0.76–1.27)
GFR calc non Af Amer: 108 mL/min/{1.73_m2} (ref >59)
GFR, EST AFRICAN AMERICAN: 125 mL/min/{1.73_m2} (ref >59)
Globulin, Total: 1.5 g/dL (ref 1.5–4.5)
Glucose: 110 mg/dL — ABNORMAL HIGH (ref 65–99)
POTASSIUM: 4 mmol/L (ref 3.5–5.2)
Sodium: 142 mmol/L (ref 134–144)
TOTAL PROTEIN: 5.8 g/dL — AB (ref 6.0–8.5)

## 2016-12-26 LAB — LIPID PANEL
CHOLESTEROL TOTAL: 159 mg/dL (ref 100–199)
Chol/HDL Ratio: 3.2 ratio (ref 0.0–5.0)
HDL: 49 mg/dL (ref >39)
LDL Calculated: 74 mg/dL (ref 0–99)
Triglycerides: 182 mg/dL — ABNORMAL HIGH (ref 0–149)
VLDL Cholesterol Cal: 36 mg/dL (ref 5–40)

## 2016-12-27 ENCOUNTER — Ambulatory Visit
Admission: RE | Admit: 2016-12-27 | Discharge: 2016-12-27 | Disposition: A | Discharge status: Home / Self Care | Payer: BLUE CROSS/BLUE SHIELD | Source: Ambulatory Visit | Attending: Neurosurgery | Admitting: Neurosurgery

## 2016-12-27 DIAGNOSIS — M5442 Lumbago with sciatica, left side: Secondary | ICD-10-CM

## 2016-12-29 ENCOUNTER — Telehealth: Payer: Self-pay | Admitting: *Deleted

## 2016-12-29 NOTE — Telephone Encounter (Signed)
Pharmacy stated drug not covered by patient plan. The preferred alternative is LEVALBUTEROLAERACT, PROAIRHFAAER,PROAIRRESPIAER.

## 2016-12-31 MED ORDER — ALBUTEROL SULFATE HFA 108 (90 BASE) MCG/ACT IN AERS: 2.0000 | INHALATION_SPRAY | Freq: Four times a day (QID) | RESPIRATORY_TRACT | 3 refills | Status: DC | PRN

## 2016-12-31 NOTE — Telephone Encounter (Signed)
Sent in [employer]

## 2016-12-31 NOTE — Addendum Note (Signed)
Addended by: Collie SiadSTALLINGS, Lenoard Helbert A on: 12/31/2016 04:03 PM   Modules accepted: Orders

## 2017-05-05 ENCOUNTER — Emergency Department (HOSPITAL_COMMUNITY)
Admission: EM | Admit: 2017-05-05 | Discharge: 2017-05-05 | Disposition: A | Discharge status: Home / Self Care | Payer: Self-pay | Attending: Physician Assistant | Admitting: Physician Assistant

## 2017-05-05 ENCOUNTER — Encounter (HOSPITAL_COMMUNITY): Payer: Self-pay | Admitting: *Deleted

## 2017-05-05 ENCOUNTER — Emergency Department (HOSPITAL_COMMUNITY): Payer: Self-pay

## 2017-05-05 DIAGNOSIS — M4802 Spinal stenosis, cervical region: Secondary | ICD-10-CM

## 2017-05-05 DIAGNOSIS — M542 Cervicalgia: Secondary | ICD-10-CM | POA: Insufficient documentation

## 2017-05-05 DIAGNOSIS — J45909 Unspecified asthma, uncomplicated: Secondary | ICD-10-CM | POA: Insufficient documentation

## 2017-05-05 DIAGNOSIS — Z87891 Personal history of nicotine dependence: Secondary | ICD-10-CM | POA: Insufficient documentation

## 2017-05-05 DIAGNOSIS — I10 Essential (primary) hypertension: Secondary | ICD-10-CM | POA: Insufficient documentation

## 2017-05-05 DIAGNOSIS — M5412 Radiculopathy, cervical region: Secondary | ICD-10-CM

## 2017-05-05 DIAGNOSIS — Z79899 Other long term (current) drug therapy: Secondary | ICD-10-CM | POA: Insufficient documentation

## 2017-05-05 DIAGNOSIS — M79602 Pain in left arm: Secondary | ICD-10-CM | POA: Insufficient documentation

## 2017-05-05 HISTORY — DX: Personal history of other diseases of the musculoskeletal system and connective tissue: Z87.39

## 2017-05-05 MED ORDER — KETOROLAC TROMETHAMINE 30 MG/ML IJ SOLN
30.0000 mg | Freq: Once | INTRAMUSCULAR | Status: AC
  Administered 2017-05-05: 30 mg via INTRAMUSCULAR
  Filled 2017-05-05: qty 1

## 2017-05-05 MED ORDER — NAPROXEN 375 MG PO TABS: 375.0000 mg | ORAL_TABLET | Freq: Two times a day (BID) | ORAL | 0 refills | Status: DC

## 2017-05-05 NOTE — ED Triage Notes (Signed)
Pt reports numbness and tingling sensation to bilateral arms from shoulders down to his hands, started 2-3 weeks ago. Is more severe in left arm. Hx of degenerative disc disease. Ambulatory at triage, no neuro deficits noted.

## 2017-05-05 NOTE — ED Notes (Signed)
Patient transported to CT 
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
## 2017-07-09 ENCOUNTER — Encounter (HOSPITAL_COMMUNITY): Payer: Self-pay | Admitting: Emergency Medicine

## 2017-07-09 ENCOUNTER — Emergency Department (HOSPITAL_COMMUNITY): Payer: BLUE CROSS/BLUE SHIELD

## 2017-07-09 ENCOUNTER — Observation Stay (HOSPITAL_COMMUNITY)
Admission: EM | Admit: 2017-07-09 | Discharge: 2017-07-10 | Disposition: A | Discharge status: Home / Self Care | Payer: BLUE CROSS/BLUE SHIELD | Attending: Internal Medicine | Admitting: Internal Medicine

## 2017-07-09 DIAGNOSIS — F418 Other specified anxiety disorders: Secondary | ICD-10-CM | POA: Diagnosis not present

## 2017-07-09 DIAGNOSIS — Z881 Allergy status to other antibiotic agents status: Secondary | ICD-10-CM

## 2017-07-09 DIAGNOSIS — F1311 Sedative, hypnotic or anxiolytic abuse, in remission: Secondary | ICD-10-CM

## 2017-07-09 DIAGNOSIS — J45909 Unspecified asthma, uncomplicated: Secondary | ICD-10-CM | POA: Diagnosis not present

## 2017-07-09 DIAGNOSIS — M109 Gout, unspecified: Secondary | ICD-10-CM | POA: Insufficient documentation

## 2017-07-09 DIAGNOSIS — I10 Essential (primary) hypertension: Secondary | ICD-10-CM

## 2017-07-09 DIAGNOSIS — Z791 Long term (current) use of non-steroidal anti-inflammatories (NSAID): Secondary | ICD-10-CM | POA: Insufficient documentation

## 2017-07-09 DIAGNOSIS — F1211 Cannabis abuse, in remission: Secondary | ICD-10-CM | POA: Diagnosis not present

## 2017-07-09 DIAGNOSIS — R0902 Hypoxemia: Secondary | ICD-10-CM | POA: Insufficient documentation

## 2017-07-09 DIAGNOSIS — E669 Obesity, unspecified: Secondary | ICD-10-CM | POA: Diagnosis not present

## 2017-07-09 DIAGNOSIS — Z7951 Long term (current) use of inhaled steroids: Secondary | ICD-10-CM | POA: Insufficient documentation

## 2017-07-09 DIAGNOSIS — R001 Bradycardia, unspecified: Secondary | ICD-10-CM | POA: Insufficient documentation

## 2017-07-09 DIAGNOSIS — T40601A Poisoning by unspecified narcotics, accidental (unintentional), initial encounter: Principal | ICD-10-CM | POA: Insufficient documentation

## 2017-07-09 DIAGNOSIS — T401X4A Poisoning by heroin, undetermined, initial encounter: Secondary | ICD-10-CM

## 2017-07-09 DIAGNOSIS — Z8249 Family history of ischemic heart disease and other diseases of the circulatory system: Secondary | ICD-10-CM | POA: Insufficient documentation

## 2017-07-09 DIAGNOSIS — Z823 Family history of stroke: Secondary | ICD-10-CM | POA: Insufficient documentation

## 2017-07-09 DIAGNOSIS — Z818 Family history of other mental and behavioral disorders: Secondary | ICD-10-CM | POA: Insufficient documentation

## 2017-07-09 DIAGNOSIS — Z79899 Other long term (current) drug therapy: Secondary | ICD-10-CM | POA: Diagnosis not present

## 2017-07-09 DIAGNOSIS — F1290 Cannabis use, unspecified, uncomplicated: Secondary | ICD-10-CM | POA: Insufficient documentation

## 2017-07-09 DIAGNOSIS — F1111 Opioid abuse, in remission: Secondary | ICD-10-CM

## 2017-07-09 DIAGNOSIS — Z87891 Personal history of nicotine dependence: Secondary | ICD-10-CM | POA: Insufficient documentation

## 2017-07-09 DIAGNOSIS — R0603 Acute respiratory distress: Secondary | ICD-10-CM

## 2017-07-09 DIAGNOSIS — F1110 Opioid abuse, uncomplicated: Secondary | ICD-10-CM | POA: Insufficient documentation

## 2017-07-09 DIAGNOSIS — Z880 Allergy status to penicillin: Secondary | ICD-10-CM | POA: Insufficient documentation

## 2017-07-09 DIAGNOSIS — Z6834 Body mass index (BMI) 34.0-34.9, adult: Secondary | ICD-10-CM | POA: Insufficient documentation

## 2017-07-09 DIAGNOSIS — F1191 Opioid use, unspecified, in remission: Secondary | ICD-10-CM | POA: Diagnosis present

## 2017-07-09 DIAGNOSIS — T40604A Poisoning by unspecified narcotics, undetermined, initial encounter: Secondary | ICD-10-CM

## 2017-07-09 DIAGNOSIS — Z87898 Personal history of other specified conditions: Secondary | ICD-10-CM | POA: Diagnosis present

## 2017-07-09 LAB — SALICYLATE LEVEL: Salicylate Lvl: <7 mg/dL (ref 2.8–30.0)

## 2017-07-09 LAB — COMPREHENSIVE METABOLIC PANEL
ALBUMIN: 3.5 g/dL (ref 3.5–5.0)
ALK PHOS: 78 U/L (ref 38–126)
ALT: 24 U/L (ref 17–63)
AST: 33 U/L (ref 15–41)
Anion gap: 10 (ref 5–15)
BILIRUBIN TOTAL: 1 mg/dL (ref 0.3–1.2)
BUN: 19 mg/dL (ref 6–20)
CALCIUM: 8.9 mg/dL (ref 8.9–10.3)
CO2: 22 mmol/L (ref 22–32)
Chloride: 106 mmol/L (ref 101–111)
Creatinine, Ser: 0.97 mg/dL (ref 0.61–1.24)
GFR calc Af Amer: >60 mL/min (ref >60)
GLUCOSE: 100 mg/dL — AB (ref 65–99)
Potassium: 3.9 mmol/L (ref 3.5–5.1)
Sodium: 138 mmol/L (ref 135–145)
Total Protein: 5.7 g/dL — ABNORMAL LOW (ref 6.5–8.1)

## 2017-07-09 LAB — CBC WITH DIFFERENTIAL/PLATELET
BASOS ABS: 0 10*3/uL (ref 0.0–0.1)
Basophils Relative: 0 %
Eosinophils Absolute: 0 10*3/uL (ref 0.0–0.7)
Eosinophils Relative: 0 %
HEMATOCRIT: 42.4 % (ref 39.0–52.0)
HEMOGLOBIN: 13.5 g/dL (ref 13.0–17.0)
LYMPHS PCT: 3 %
Lymphs Abs: 0.5 10*3/uL — ABNORMAL LOW (ref 0.7–4.0)
MCH: 30.8 pg (ref 26.0–34.0)
MCHC: 31.8 g/dL (ref 30.0–36.0)
MCV: 96.6 fL (ref 78.0–100.0)
MONO ABS: 1 10*3/uL (ref 0.1–1.0)
MONOS PCT: 7 %
NEUTROS ABS: 12.6 10*3/uL — AB (ref 1.7–7.7)
NEUTROS PCT: 90 %
Platelets: 222 10*3/uL (ref 150–400)
RBC: 4.39 MIL/uL (ref 4.22–5.81)
RDW: 12.9 % (ref 11.5–15.5)
WBC: 14.1 10*3/uL — ABNORMAL HIGH (ref 4.0–10.5)

## 2017-07-09 LAB — I-STAT CHEM 8, ED
BUN: 10 mg/dL (ref 6–20)
CHLORIDE: 104 mmol/L (ref 101–111)
CREATININE: 0.8 mg/dL (ref 0.61–1.24)
Calcium, Ion: 1.07 mmol/L — ABNORMAL LOW (ref 1.15–1.40)
GLUCOSE: 105 mg/dL — AB (ref 65–99)
HCT: 39 % (ref 39.0–52.0)
Hemoglobin: 13.3 g/dL (ref 13.0–17.0)
Potassium: 3.8 mmol/L (ref 3.5–5.1)
SODIUM: 142 mmol/L (ref 135–145)
TCO2: 25 mmol/L (ref 22–32)

## 2017-07-09 LAB — CREATININE, SERUM
Creatinine, Ser: 0.89 mg/dL (ref 0.61–1.24)
GFR calc Af Amer: >60 mL/min (ref >60)
GFR calc non Af Amer: >60 mL/min (ref >60)

## 2017-07-09 LAB — ACETAMINOPHEN LEVEL: Acetaminophen (Tylenol), Serum: <10 ug/mL — ABNORMAL LOW (ref 10–30)

## 2017-07-09 LAB — CBC
HCT: 43.5 % (ref 39.0–52.0)
Hemoglobin: 14.3 g/dL (ref 13.0–17.0)
MCH: 32.1 pg (ref 26.0–34.0)
MCHC: 32.9 g/dL (ref 30.0–36.0)
MCV: 97.5 fL (ref 78.0–100.0)
Platelets: 261 10*3/uL (ref 150–400)
RBC: 4.46 MIL/uL (ref 4.22–5.81)
RDW: 13.4 % (ref 11.5–15.5)
WBC: 20.5 10*3/uL — ABNORMAL HIGH (ref 4.0–10.5)

## 2017-07-09 LAB — ETHANOL

## 2017-07-09 MED ORDER — BUDESONIDE 0.25 MG/2ML IN SUSP
2.0000 mL | Freq: Two times a day (BID) | RESPIRATORY_TRACT | Status: DC
  Administered 2017-07-09: 0.25 mg via RESPIRATORY_TRACT
  Filled 2017-07-09: qty 2

## 2017-07-09 MED ORDER — METOPROLOL SUCCINATE ER 100 MG PO TB24: 100.0000 mg | ORAL_TABLET | Freq: Every day | ORAL | Status: DC

## 2017-07-09 MED ORDER — NALOXONE HCL 0.4 MG/ML IJ SOLN
0.4000 mg | Freq: Once | INTRAMUSCULAR | Status: AC
  Administered 2017-07-09: 0.4 mg via INTRAVENOUS

## 2017-07-09 MED ORDER — KETOROLAC TROMETHAMINE 15 MG/ML IJ SOLN
15.0000 mg | Freq: Once | INTRAMUSCULAR | Status: AC
  Administered 2017-07-09: 15 mg via INTRAVENOUS
  Filled 2017-07-09: qty 1

## 2017-07-09 MED ORDER — ACETAMINOPHEN 325 MG PO TABS: 650.0000 mg | ORAL_TABLET | Freq: Four times a day (QID) | ORAL | Status: DC | PRN

## 2017-07-09 MED ORDER — ENOXAPARIN SODIUM 40 MG/0.4ML ~~LOC~~ SOLN
40.0000 mg | SUBCUTANEOUS | Status: DC
  Administered 2017-07-09: 40 mg via SUBCUTANEOUS
  Filled 2017-07-09: qty 0.4

## 2017-07-09 MED ORDER — ALLOPURINOL 100 MG PO TABS
100.0000 mg | ORAL_TABLET | Freq: Every day | ORAL | Status: DC
  Administered 2017-07-10: 100 mg via ORAL
  Filled 2017-07-09: qty 1

## 2017-07-09 MED ORDER — PANTOPRAZOLE SODIUM 40 MG PO TBEC
40.0000 mg | DELAYED_RELEASE_TABLET | Freq: Every day | ORAL | Status: DC
  Administered 2017-07-10: 40 mg via ORAL
  Filled 2017-07-09 (×2): qty 1

## 2017-07-09 MED ORDER — ALBUTEROL SULFATE (2.5 MG/3ML) 0.083% IN NEBU: 2.5000 mg | INHALATION_SOLUTION | RESPIRATORY_TRACT | Status: DC | PRN

## 2017-07-09 MED ORDER — ALBUTEROL SULFATE (2.5 MG/3ML) 0.083% IN NEBU
2.5000 mg | INHALATION_SOLUTION | RESPIRATORY_TRACT | Status: AC
  Administered 2017-07-09 (×3): 2.5 mg via RESPIRATORY_TRACT

## 2017-07-09 MED ORDER — ALPRAZOLAM 0.5 MG PO TABS
0.5000 mg | ORAL_TABLET | Freq: Three times a day (TID) | ORAL | Status: DC | PRN
  Administered 2017-07-09 – 2017-07-10 (×3): 0.5 mg via ORAL
  Filled 2017-07-09 (×2): qty 2
  Filled 2017-07-09: qty 1

## 2017-07-09 MED ORDER — ALBUTEROL SULFATE (2.5 MG/3ML) 0.083% IN NEBU
INHALATION_SOLUTION | RESPIRATORY_TRACT | Status: AC
  Administered 2017-07-09: 2.5 mg via RESPIRATORY_TRACT
  Filled 2017-07-09: qty 9

## 2017-07-09 MED ORDER — NALOXONE HCL 0.4 MG/ML IJ SOLN
INTRAMUSCULAR | Status: AC
  Filled 2017-07-09: qty 1

## 2017-07-09 MED ORDER — IPRATROPIUM-ALBUTEROL 0.5-2.5 (3) MG/3ML IN SOLN
3.0000 mL | RESPIRATORY_TRACT | Status: DC | PRN
  Administered 2017-07-09 – 2017-07-10 (×2): 3 mL via RESPIRATORY_TRACT
  Filled 2017-07-09 (×2): qty 3

## 2017-07-09 MED ORDER — BUPROPION HCL ER (XL) 150 MG PO TB24
150.0000 mg | ORAL_TABLET | Freq: Two times a day (BID) | ORAL | Status: DC
  Administered 2017-07-09: 150 mg via ORAL
  Filled 2017-07-09: qty 1

## 2017-07-09 NOTE — ED Notes (Signed)
Pt states he was feeling anxious and was sweating; pt given prn med-Monique,RN

## 2017-07-09 NOTE — ED Notes (Signed)
Pt given a [employer] per [place], Charity fundraiserN.  Encouraged the pt to provide a urine sample when he is able.

## 2017-07-09 NOTE — ED Notes (Signed)
ED Provider at bedside. 

## 2017-07-09 NOTE — ED Notes (Signed)
Per AD this RN will transport pt to  Floor and give bedside report; AD spoke with receiving RN on 5W- Monique,RN

## 2017-07-09 NOTE — H&P (Signed)
Date: 07/09/2017               Patient Name:  CASPER PAGLIUCA MRN: [id number]  DOB: [birth year] Age / Sex: 42 y.o., male   PCP: Ethelda Chick, MD         Medical Service: Internal Medicine Teaching Service         Attending Physician: Dr. Sandre Kitty Elwin Mocha, MD    First Contact: Dr. Caron Presume Pager: [phone]  Second Contact: Dr. Vincente Liberty Pager: [phone]       After Hours (After 5p/  First Contact Pager: [phone]  weekends / holidays): Second Contact Pager: [phone]   Chief Complaint: Found unresponsive  History of Present Illness: Chad Donoghue is a 42 year old male with a history of polysubstance abuse including heroin, benzos, and marijuana who was found down in his residency by family.  The family is unaware of how long he was down prior to them finding him.  Per notes he was given Narcan by his family and EMS was called.  On arrival by paramedics he was found to have respiratory depression with vomit on his face.  He was given another dose of Narcan.  He subsequently became extremely agitated, diaphoretic, and combative.  He was given Versed (total of 10 mg).  The patient was initially very somnolent but protecting his airway.  On evaluation the patient is alert and oriented.  He states that last night he got off his shift at [employer] around 11 PM.  He returned home where his daughter was having difficulty sleeping.  He subsequently was not able to get much sleep.  This morning he reports taking some ibuprofen, and some oxycodone that he got off the streets.  He went upstairs to his room in the next thing he remembers is waking up to a bunch of paramedics surrounding him.  He states that he does use Xanax 3 times daily for severe anxiety and has not taken it for a few days.  He endorses a history of heroin and marijuana use however is evasive when asked when his most recent use was.  He endorses some recent URI symptoms including headache, myalgias, rhinorrhea, cough, diaphoresis.  He  otherwise feels well and has no other complaints.  Meds:  No outpatient medications have been marked as taking for the 07/09/17 encounter [facility] Encounter).   Allergies: Allergies as of 07/09/2017 - Review Complete 07/09/2017  Allergen Reaction Noted  . Amoxicillin Hives 07/27/2011   Past Medical History:  Diagnosis Date  . Acid reflux   . Allergy   . Anxiety   . Arthritis   . Asthma   . Bronchitis   . Depression with anxiety   . H/O degenerative disc disease   . 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 Hypertension   . Neuromuscular disorder [facility])    Family History:  Father: + CAD Grandparents: + CAD, CVA  Social History:  Works at [employer], has 1 young daughter Cherlynn Perches the use of marijuana and heroin Occasionally uses alcohol, but states he is on a daily drinker  Review of Systems: A complete ROS was negative except as per HPI.   Physical Exam: Blood pressure (!) 142/90, pulse (!) 48, temperature 99.7 F (37.6 C), temperature source Rectal, resp. rate 18, SpO2 91 %.  General: Obese male in no acute distress but is diaphoretic HENT: Normocephalic, atraumatic, tongue has a brownish discoloration. Pulm: Rhonchi in the lower lung fields, no wheezing noted CV:  Regular rate and rhythm, no murmurs or rubs appreciated Abdomen: Active bowel sounds, soft, nondistended, no tenderness to palpation Extremities: No lower extremity edema, spontaneously moving all extremities Skin: Diaphoretic, warm Neuro: Alert and oriented x3  EKG: personally reviewed: my interpretation is sinus rhythm, normal axis, normal intervals.  No ST elevation.  Possible right bundle branch block.  CXR: personally reviewed: my interpretation is good inspiration, penetration, but poor rotation. No bony or soft tissue abnormalities. Patchy right middle lobe infiltrate/opacity. No cardiomegaly or pleural effusions.  Assessment & Plan by Problem: Active Problems:   Heroin overdose  (HCC)  Found down / polysubstance abuse. Edmund HildaJoseph Witz's is a 42 year old male with a history of polysubstance abuse including heroin, benzos, and marijuana who was found down in his residency by family.  Given that the patient promptly responded to Narcan, the most likely etiology is opiate overdose.  However the patient does use benzodiazepines as an outpatient, and reports that he has not taken any for the past 3 days leaving benzo withdrawal as a possibility.  Also typically benzo withdrawal precipitates seizures.  Based on the patient's history and the quick response I feel that seizures benzo withdrawal or less likely.  He does not have any focal neuro deficits on exam and EKG is fairly unremarkable.  At this point I do not believe that this unresponsive episode is secondary to neurological or cardiac etiology. - Admit for observation with telemetry  - Will restart the patient's Xanax 0.5 mg TID - COWS score of 5  - Drug screen pending  Hypertension Patient is on chlorthalidone 12.5 mg, and metoprolol succinate 100 mg daily.  He reports only taking the chlorthalidone as needed.  Will continue with metoprolol succinate 100 mg daily while in the hospital  Depression with anxiety Patient reports severe anxiety.  Per  drug database he consistently receives Xanax 0.5 mg tablets with his last refill being in November.  Will restart while in the hospital to avoid further withdrawal.  Patient is also on bupropion 100 mg daily.  We will continue this as well.  Asthma No wheezing heard on exam.  Do not suspect asthma exacerbation or underlying infection.  Will continue albuterol as needed.  Diet: Regular DVT prophylaxis: Lovenox CODE STATUS: Full code  Dispo: Admit patient to Observation with expected length of stay less than 2 midnights.  Signed: Levora DredgeHelberg, Mar Zettler, MD 07/09/2017, 4:43 PM  My Pager: [phone]

## 2017-07-09 NOTE — ED Triage Notes (Signed)
Pt arrives via [facility], ems reports pts family called out stating pt began acting strange, upon ems arrival, pt altered, diaphoretic and tachycardic.pt has known hx of heroin and opiate use, admits to heroin use today. Pt given unknown amt of narcan by family. Received 125 mg solumedrol, 1L ns, 5mg  albuterol and 0.5 atrovent, 5mg  im versed and 5mg  iv versed. Pt 95% on NRB upon arrival. Pt able to answer questions upon arrival. cbg 290.

## 2017-07-09 NOTE — ED Notes (Signed)
Radiology  called for portable xray.

## 2017-07-09 NOTE — ED Provider Notes (Signed)
[facility] EMERGENCY DEPARTMENT Provider Note   CSN: [id number] Arrival date & time: 07/09/17  0857     History   Chief Complaint Chief Complaint  Patient presents with  . Respiratory Distress  . Drug Overdose    HPI Brandon Garza is a 42 y.o. male.  HPI  The patient is a 42 year old male, he has a known history of multi-substance abuse including heroin, according to the paramedics they arrived at the family's residence after he reportedly had respiratory severe depression, they gave a dose of Narcan because of his known history of heroin use and several family members noted that there was some heroin missing from people's personal supplies, the patient very quickly after receiving Narcan became extremely agitated, he was hot, diaphoretic, combative and required Versed, the patient's requested additional Versed because of combativeness an additional 5 mg was given for a total of 10 mg prehospital.  The patient is unable to give much in the way of information, on the paramedics arrival they found that he had evidence of vomit all over his face and very quickly went into respiratory distress with shortness of breath wheezing rhonchi and persistent hypoxia to mid 80s, this was despite high flow nonrebreather, continuous nebulizer and Solu-Medrol which were all given prehospital.  Past Medical History:  Diagnosis Date  . Acid reflux   . Allergy   . Anxiety   . Arthritis   . Asthma   . Bronchitis   . Depression with anxiety   . H/O degenerative disc disease   . 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 Hypertension   . Neuromuscular disorder [facility])     Patient Active Problem List   Diagnosis Date Noted  . 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 Mild intermittent asthma with acute exacerbation 12/25/2016  . Viral URI with cough 06/28/2013  . GERD (gastroesophageal reflux disease) 10/20/2012  . Essential hypertension 02/12/2012  . Stuttering 02/12/2012    . GAD (generalized anxiety disorder) 02/12/2012  . Nicotine use disorder 02/12/2012  . Chronic back pain 02/12/2012  . Hip dislocation, left (HCC) 02/12/2012  . Gout of multiple sites 08/04/2011    Past Surgical History:  Procedure Laterality Date  . CORNEAL TRANSPLANT     left eye  . DOBUTAMINE STRESS ECHO  10/12/2008   normal  . left hip surgery    . NASAL SEPTUM SURGERY    . orthopedic left arm surgery     done following an accident  . TONSILLECTOMY         Home Medications    Prior to Admission medications   Medication Sig Start Date End Date Taking? Authorizing Provider  acetaminophen (TYLENOL) 500 MG tablet Take 2 tablets (1,000 mg total) by mouth every 6 (six) hours as needed. 08/14/16   Arby Barrette, MD  albuterol (PROVENTIL HFA;VENTOLIN HFA) 108 (90 Base) MCG/ACT inhaler Inhale 2 puffs into the lungs every 6 (six) hours as needed for wheezing or shortness of breath. 12/31/16   Doristine Bosworth, MD  allopurinol (ZYLOPRIM) 100 MG tablet Take 1 tablet (100 mg total) by mouth daily. Patient taking differently: Take 100 mg at bedtime by mouth.  12/28/15   Collene Gobble, MD  ALPRAZolam Prudy Feeler) 0.5 MG tablet Take 1 tablet (0.5 mg total) by mouth 3 (three) times daily as needed for anxiety. To follow current prescription 12/25/16   Doristine Bosworth, MD  beclomethasone (QVAR) 80 MCG/ACT inhaler Inhale 2 puffs into the  lungs 2 (two) times daily. To use every single day as a prevention Patient taking differently: Inhale 2 puffs 2 (two) times daily as needed into the lungs (FOR BREATHING). To use every single day as a prevention 05/01/16   Trena PlattEnglish, Stephanie D, PA  buPROPion (WELLBUTRIN XL) 150 MG 24 hr tablet Take 1 tablet (150 mg total) by mouth 2 (two) times daily. 08/21/16   Doristine BosworthStallings, Zoe A, MD  chlorthalidone (HYGROTON) 25 MG tablet Take 0.5 tablets (12.5 mg total) by mouth daily. Patient taking differently: Take 12.5 mg daily as needed by mouth.  03/23/16   Bing NeighborsHarris, Kimberly S,  FNP  colchicine 0.6 MG tablet 1.2 mg at the first sign of flare, followed in 1 hour with a single dose of 0.6 mg (maximum: 1.8 mg within 1 hour).  Repeat as needed. 08/08/16   Fayrene Helperran, Bowie, PA-C  methylPREDNISolone (MEDROL DOSEPAK) 4 MG TBPK tablet Take as directed. 10/27/16   Ward, Chase PicketJaime Pilcher, PA-C  metoprolol succinate (TOPROL-XL) 100 MG 24 hr tablet TAKE 1 TABLET (100 MG TOTAL) BY MOUTH DAILY. TAKE WITH OR IMMEDIATELY FOLLOWING A MEAL. 07/12/15   Peyton NajjarHopper, David H, MD  naproxen (NAPROSYN) 375 MG tablet Take 1 tablet (375 mg total) 2 (two) times daily with a meal by mouth. 05/05/17   Everlene Farrieransie, William, PA-C    Family History Family History  Problem Relation Age of Onset  . Heart Problems Father   . Stroke Maternal Grandfather   . Heart Problems Maternal Grandfather   . Mental illness Paternal Grandmother   . Heart Problems Paternal Grandfather     Social History Social History   Tobacco Use  . Smoking status: Former Smoker    Last attempt to quit: 07/22/2011    Years since quitting: 5.9  . Smokeless tobacco: Former User    Types: Snuff, Dorna BloomChew    Quit date: 07/22/2011  Substance Use Topics  . Alcohol use: Yes    Alcohol/week: 3.0 - 6.0 oz    Types: 6 - 12 Standard drinks or equivalent per week    Comment: social  . Drug use: No     Allergies   Amoxicillin   Review of Systems Review of Systems  Unable to perform ROS: Acuity of condition     Physical Exam Updated Vital Signs BP 133/66   Pulse 71   Temp 99.7 F (37.6 C) (Rectal)   Resp 19   SpO2 94%   Physical Exam  Constitutional: He appears well-developed and well-nourished. He appears distressed.  HENT:  Head: Normocephalic and atraumatic.  Mouth/Throat: Oropharynx is clear and moist. No oropharyngeal exudate.  Skin in the peri-oral area is covered with vomitus  Eyes: Conjunctivae and EOM are normal. Pupils are equal, round, and reactive to light. Right eye exhibits no discharge. Left eye exhibits no discharge. No  scleral icterus.  Neck: Normal range of motion. Neck supple. No JVD present. No thyromegaly present.  Cardiovascular: Normal rate, regular rhythm, normal heart sounds and intact distal pulses. Exam reveals no gallop and no friction rub.  No murmur heard. Tachycardic, pulses at the radial arteries are normal  Pulmonary/Chest: He is in respiratory distress. He has wheezes. He has no rales.  Acute respiratory distress in extremis with severe wheezing, increased work of breathing tachypnea requiring high flow nonrebreather and BiPAP wheezing in all lung fields  Abdominal: Soft. Bowel sounds are normal. He exhibits no distension and no mass. There is no tenderness.  Musculoskeletal: Normal range of motion. He exhibits no edema  or tenderness.  Lymphadenopathy:    He has no cervical adenopathy.  Neurological:  Somnolent to obtunded but arousable to painful stimuli, is able to follow simple commands but very quickly becomes somnolent, seems to be symmetrical with strength, no obvious facial droop  Skin: Skin is warm and dry. No rash noted. No erythema.  Psychiatric: He has a normal mood and affect. His behavior is normal.  Nursing note and vitals reviewed.    ED Treatments / Results  Labs (all labs ordered are listed, but only abnormal results are displayed) Labs Reviewed  CBC WITH DIFFERENTIAL/PLATELET - Abnormal; Notable for the following components:      Result Value   WBC 14.1 (*)    Neutro Abs 12.6 (*)    Lymphs Abs 0.5 (*)    All other components within normal limits  COMPREHENSIVE METABOLIC PANEL - Abnormal; Notable for the following components:   Glucose, Bld 100 (*)    Total Protein 5.7 (*)    All other components within normal limits  I-STAT CHEM 8, ED - Abnormal; Notable for the following components:   Glucose, Bld 105 (*)    Calcium, Ion 1.07 (*)    All other components within normal limits  ETHANOL  RAPID URINE DRUG SCREEN, HOSP PERFORMED  ACETAMINOPHEN LEVEL  SALICYLATE  LEVEL    EKG  EKG Interpretation  Date/Time:  Friday July 09 2017 09:06:43 EST Ventricular Rate:  77 PR Interval:    QRS Duration: 108 QT Interval:  368 QTC Calculation: 417 R Axis:   68 Text Interpretation:  Sinus rhythm Abnormal R-wave progression, early transition Since last tracing No significant change since Abnormal ekg Confirmed by Eber Hong ([id number]) on 07/09/2017 9:13:34 AM       Radiology Dg Chest Portable 1 View  Result Date: 07/09/2017 CLINICAL DATA:  Overdose, given Narcan, vomited, question aspiration, respiratory distress, altered level of consciousness EXAM: PORTABLE CHEST 1 VIEW COMPARISON:  Portable exam 0902 hours compared to 08/21/2016 FINDINGS: Normal heart size, mediastinal contours, and pulmonary vascularity. Minimal atelectasis or infiltrate at RIGHT base, aspiration not excluded. Remaining lungs clear. No pleural effusion or pneumothorax. Bones unremarkable. IMPRESSION: Mild RIGHT basilar opacity which could represent atelectasis, infiltrate or aspiration. Electronically Signed   By: Ulyses Southward M.D.   On: 07/09/2017 09:26    Procedures .Critical Care Performed by: Eber Hong, MD Authorized by: Eber Hong, MD   Critical care provider statement:    Critical care time (minutes):  35   Critical care time was exclusive of:  Separately billable procedures and treating other patients and teaching time   Critical care was necessary to treat or prevent imminent or life-threatening deterioration of the following conditions:  Respiratory failure and CNS failure or compromise   Critical care was time spent personally by me on the following activities:  Blood draw for specimens, development of treatment plan with patient or surrogate, discussions with consultants, evaluation of patient's response to treatment, examination of patient, obtaining history from patient or surrogate, ordering and performing treatments and interventions, ordering and review of  laboratory studies, ordering and review of radiographic studies, pulse oximetry, re-evaluation of patient's condition and review of old charts   (including critical care time)  Medications Ordered in ED Medications  naloxone (NARCAN) 0.4 MG/ML injection (not administered)  albuterol (PROVENTIL) (2.5 MG/3ML) 0.083% nebulizer solution 2.5 mg (2.5 mg Nebulization Given 07/09/17 0926)  naloxone [facility]) injection 0.4 mg (0.4 mg Intravenous Given 07/09/17 0943)     Initial Impression /  Assessment and Plan / ED Course  I have reviewed the triage vital signs and the nursing notes.  Pertinent labs & imaging results that were available during my care of the patient were reviewed by me and considered in my medical decision making (see chart for details).     The patient is likely had a significant overdose of medications though I am significantly concerned that he has aspirated given his hypoxia and wheezing after vomiting after being given Narcan at home.  At this time he is on BiPAP, he is becoming persistently somnolent to obtunded and likely will require further Narcan and may require intubation if he cannot keep his mental status nor his oxygen saturations in a reasonable range.  At this time there is no family here to discuss this case however he is 42 years old and I assume that he is a full code.  The patient required BiPAP The patient required ongoing cardiac monitoring and repeat doses of Narcan for persistent sedation and respiratory depression  Eventually the patient improved with his mental status and his respirations, he continued to be short of breath and hypoxic requiring additional oxygen but no findings on x-ray.  I do suspect he has an aspiration pneumonitis. I reevaluated the patient multiple times and spent quite some time at the bedside making sure that his mental status did not decline nor his respiratory status.  I discussed the care with the internal medicine resident who will  admit the patient to a high level of care.  Final Clinical Impressions(s) / ED Diagnoses   Final diagnoses:  Respiratory distress  Opiate overdose, undetermined intent, initial encounter (HCC)      Eber Hong, MD 07/09/17 1122

## 2017-07-09 NOTE — Progress Notes (Signed)
Pt arrived to floor, ambulated to bed. Identified appropriately and assessed. Pt wheezing, auscultated rhonci in lungs bilaterally. Pt diaphoretic and restless, c/o heartburn and headache. Vital signs stable. Placed on telemetry monitoring and oriented to floor. Call bell within reach. Notified MD on call, awaiting new orders. Will continue to monitor.

## 2017-07-09 NOTE — Progress Notes (Signed)
[facility] NIGHT FLOAT PROGRESS NOTE  Paged by RN at ~11:30pm that patient was complaining of headache and heartburn and was very restless. Bradycardic to 50s. Other vital signs stable. Went to bedside to evaluate patient.  Upon entering the room, patient was reclining comfortably in bed in NAD. Able to speak in full sentences with a stutter. He endorsed headache and heartburn. States he doesn't normally get heartburn or take anything at home for it, but did used to take Protonix or an OTC Tums for relief. He reports that the last time he took narcotics was within the last 24 hours, but he does not remember exactly when or what dose he took. He states that he only takes opioid medications for pain when he is prescribed a short course and denies obtaining them illicitly. He thinks that the reason for his presentation to the hospital was because he had taken opiates and leftover flexeril from prior prescriptions. He denies ever having withdrawn from narcotics and has never sought treatment for opioid use disorder.  Assessment and Plan COWS score 2. I do not think he is withdrawing at this time. We will treat his heartburn and headache and continue to monitor. - Will get EKG due to evaluate bradycardia - IV Toradol 15mg  x1 - Ibuprofen 650mg  q6h PRN for headache/pain - Tums PRN - Pantoprazole 40mg  qday

## 2017-07-09 NOTE — ED Notes (Signed)
RN attempted to call report to floor; RN will call back-Monique,RN

## 2017-07-09 NOTE — ED Notes (Signed)
Regular dinner tray ordered @ 1607

## 2017-07-09 NOTE — ED Notes (Signed)
Pt awake now , pt off bipap , placed on 4 liters N/C

## 2017-07-10 ENCOUNTER — Encounter (HOSPITAL_COMMUNITY): Payer: Self-pay | Admitting: Internal Medicine

## 2017-07-10 DIAGNOSIS — F1111 Opioid abuse, in remission: Secondary | ICD-10-CM | POA: Diagnosis not present

## 2017-07-10 DIAGNOSIS — T401X4A Poisoning by heroin, undetermined, initial encounter: Secondary | ICD-10-CM | POA: Diagnosis not present

## 2017-07-10 DIAGNOSIS — F1211 Cannabis abuse, in remission: Secondary | ICD-10-CM | POA: Diagnosis not present

## 2017-07-10 DIAGNOSIS — E669 Obesity, unspecified: Secondary | ICD-10-CM | POA: Diagnosis not present

## 2017-07-10 LAB — RAPID URINE DRUG SCREEN, HOSP PERFORMED
Amphetamines: NOT DETECTED
Barbiturates: NOT DETECTED
Benzodiazepines: POSITIVE — AB
Cocaine: NOT DETECTED
Opiates: POSITIVE — AB
Tetrahydrocannabinol: POSITIVE — AB

## 2017-07-10 MED ORDER — INFLUENZA VAC SPLIT QUAD 0.5 ML IM SUSY: 0.5000 mL | PREFILLED_SYRINGE | INTRAMUSCULAR | Status: DC

## 2017-07-10 MED ORDER — HYDROCHLOROTHIAZIDE 12.5 MG PO CAPS
12.5000 mg | ORAL_CAPSULE | Freq: Every day | ORAL | Status: DC
  Administered 2017-07-10: 12.5 mg via ORAL
  Filled 2017-07-10: qty 1

## 2017-07-10 MED ORDER — PNEUMOCOCCAL VAC POLYVALENT 25 MCG/0.5ML IJ INJ: 0.5000 mL | INJECTION | INTRAMUSCULAR | Status: DC

## 2017-07-10 MED ORDER — CALCIUM CARBONATE ANTACID 500 MG PO CHEW
2.0000 | CHEWABLE_TABLET | Freq: Two times a day (BID) | ORAL | Status: DC | PRN
  Administered 2017-07-10: 400 mg via ORAL
  Filled 2017-07-10: qty 2

## 2017-07-10 NOTE — Progress Notes (Signed)
   Subjective: Patient feeling much improved today.  No longer feeling diaphoretic or anxious.  We discussed the events leading to his admission.  He continues to be evasive about his substance use.  We discussed outpatient treatment options including counseling and medications.  He states that he would be interested in getting some information about additional services.  We will provide him with contact information to alcohol and drug services in [place].  He agrees with the plan.  All questions and concerns addressed.  Objective: Vital signs in last 24 hours: Vitals:   07/10/17 0209 07/10/17 0412 07/10/17 0447 07/10/17 0449  BP: 132/69   131/69  Pulse: (!) 42 (!) 54  (!) 50  Resp:  20  18  Temp: (!) 97.5 F (36.4 C)   98 F (36.7 C)  TempSrc: Oral   Oral  SpO2:  96%  95%  Weight:   238 lb 8.6 oz (108.2 kg)   Height:   5\' 10"  (1.778 m)    General: Obese male in no acute distress Pulm: Good air movement with no wheezing or crackles CV: Regular rate and rhythm, no murmurs, no rubs Abdomen: Active bowel sounds, soft, nondistended, no tenderness palpation Extremities: No lower extremity edema  Assessment/Plan:  Arlyce DiceJoseph Delisa is a 42 year old male with a history of polysubstance abuse including heroin, benzos, and marijuana who was found down in his primary residency. This event is felt to be secondary to opiate overdose. Overall he is doing well. Does not appear to be in withdrawal.  Polysubstance abuse. - Admitted after suspected opiate overdose. - Endorses the use of heroin, benzos, and marijuana. UDS as expected - He has gone to drug counseling in the past.  He would be interested in additional information and we will provide him contact information to alcohol and drug services - COW score of 0.   Bradycardia - Patient's resting heart rate overnight varying between 30-50.  Asymptomatic. - Likely due to underlying OSA however, patient is on Metoprolol succinate 100 mg daily for  blood pressure control.  We will hold at this morning.  Hypertension - Per outpatient clinic notes patient is supposed to be on hydrochlorothiazide 12.5 mg and metoprolol succinate 100 mg daily. - Due to patient's resting bradycardia we will hold his metoprolol this morning but continue his hydrochlorothiazide  Depression with anxiety - Per outpatient clinic notes patient is on Xanax 0.5 mg 3 times daily and bupropion 100 mg daily. We will continue these medications.  Asthma  -As needed albuterol   Dispo: Anticipated discharge in approximately 0 day(s).   Levora DredgeHelberg, Rhet Rorke, MD 07/10/2017, 5:36 AM My Pager: [phone]

## 2017-07-10 NOTE — Discharge Summary (Signed)
Name: Brandon Garza MRN: [id number] DOB: [birth year] 42 y.o. PCP: Brandon Chick, MD  Date of Admission: 07/09/2017  8:57 AM Date of Discharge:  Attending Physician: Brandon Shutter, MD  Discharge Diagnosis: 1.  Opiate overdose / polysubstance abuse 2.  Suspected sleep apnea  Active Problems:   Heroin overdose [facility])  Discharge Medications: Allergies as of 07/10/2017      Reactions   Amoxicillin Hives   Has patient had a PCN reaction causing immediate rash, facial/tongue/throat swelling, SOB or lightheadedness with hypotension: no Has patient had a PCN reaction causing severe rash involving mucus membranes or skin necrosis: no Has patient had a PCN reaction that required hospitalization: on Has patient had a PCN reaction occurring within the last 10 years: no If all of the above answers are "NO", then may proceed with Cephalosporin use.      Medication List    TAKE these medications   acetaminophen 500 MG tablet Commonly known as:  TYLENOL Take 2 tablets (1,000 mg total) by mouth every 6 (six) hours as needed. What changed:  reasons to take this   albuterol 108 (90 Base) MCG/ACT inhaler Commonly known as:  PROVENTIL HFA;VENTOLIN HFA Inhale 2 puffs into the lungs every 6 (six) hours as needed for wheezing or shortness of breath.   allopurinol 100 MG tablet Commonly known as:  ZYLOPRIM Take 1 tablet (100 mg total) by mouth daily.   ALPRAZolam 0.5 MG tablet Commonly known as:  XANAX Take 1 tablet (0.5 mg total) by mouth 3 (three) times daily as needed for anxiety. To follow current prescription   beclomethasone 80 MCG/ACT inhaler Commonly known as:  QVAR Inhale 2 puffs into the lungs 2 (two) times daily. To use every single day as a prevention   buPROPion 150 MG 24 hr tablet Commonly known as:  WELLBUTRIN XL Take 1 tablet (150 mg total) by mouth 2 (two) times daily.   chlorthalidone 25 MG tablet Commonly known as:  HYGROTON Take 0.5 tablets (12.5 mg total)  by mouth daily.   colchicine 0.6 MG tablet 1.2 mg at the first sign of flare, followed in 1 hour with a single dose of 0.6 mg (maximum: 1.8 mg within 1 hour).  Repeat as needed.   indomethacin 50 MG capsule Commonly known as:  INDOCIN Take 50 mg by mouth 2 (two) times daily with a meal.   metoprolol succinate 100 MG 24 hr tablet Commonly known as:  TOPROL-XL TAKE 1 TABLET (100 MG TOTAL) BY MOUTH DAILY. TAKE WITH OR IMMEDIATELY FOLLOWING A MEAL.   naproxen 375 MG tablet Commonly known as:  NAPROSYN Take 1 tablet (375 mg total) 2 (two) times daily with a meal by mouth.     Disposition and follow-up:   Brandon Garza was discharged from [facility] in Stable condition.  At the hospital follow up visit please address:  1.  Polysubstance abuse.  Please discussed with the patient contacted alcohol and drug services.  Or whether he is interested in any other treatment programs.  Suspected sleep apnea.  Please consider referral for sleep study.  2.  Labs / imaging needed at time of follow-up: None  3.  Pending labs/ test needing follow-up: None  Follow-up Appointments: Follow-up Information    Brandon Chick, MD Follow up.   Specialty:  Family Medicine Contact information: [address] [place] [address] [phone]        Services, Alcohol And Drug. Schedule an appointment as soon  as possible for a visit.   Specialty:  [facility] Contact information: [address] [place] [id number] [phone]         Hospital Course by problem list: Active Problems:   Heroin overdose (HCC)   Opiate overdose / polysubstance abuse.  Brandon Garza is a 42 year old male with a history of polysubstance abuse including heroin, benzos, and marijuana who was found down in his primary residency. This event was felt to be secondary to opiate overdose as the patient responded to 2 doses of Narcan. He was subsequently given Versed for  agitation. He was admitted for observation. Patient subsequently improved, and did not appear to go through opiate withdrawal. COW score maintained less than 5. We discussed his polysubstance abuse and provided with information to [facility] in [place]. We encouraged him to follow-up as soon as possible. He was discharged in stable condition and instructed to follow-up with his primary care provider as soon as possible.  Suspected sleep apnea.  Patient is obese, and overnight was noted to become hypoxic and bradycardic. He also had EKG changes during these episodes that resolved when he was awakened. At this point we suspect that the patient has underlying OSA, and would recommend further evaluation with a sleep study.  Discharge Vitals:   BP 131/69 (BP Location: Right Arm)   Pulse (!) 50   Temp 98 F (36.7 C) (Oral)   Resp 18   Ht 5\' 10"  (1.778 m)   Wt 238 lb 8.6 oz (108.2 kg)   SpO2 95%   BMI 34.23 kg/m   Discharge Instructions: Discharge Instructions    Diet - low sodium heart healthy   Complete by:  As directed    Increase activity slowly   Complete by:  As directed      Signed: Levora Garza, Brandon Dezeeuw, MD 07/10/2017, 10:59 AM   My Pager: [phone]

## 2017-07-10 NOTE — Progress Notes (Signed)
Notified by CCMD that pt bradycardic sustaining in upper 30's. Upon assessment, pt resting, easy to rouse, denies any symptoms. Notified MD on call. Will continue to monitor and assess.

## 2017-07-10 NOTE — Progress Notes (Signed)
Nsg Discharge Note  Admit Date:  07/09/2017 Discharge date: 07/10/2017   Brandon Garza to be D/C'd Home per MD order.  AVS completed.  Copy for chart, and copy for patient signed, and dated. Patient/caregiver able to verbalize understanding.  Discharge Medication: Allergies as of 07/10/2017      Reactions   Amoxicillin Hives   Has patient had a PCN reaction causing immediate rash, facial/tongue/throat swelling, SOB or lightheadedness with hypotension: no Has patient had a PCN reaction causing severe rash involving mucus membranes or skin necrosis: no Has patient had a PCN reaction that required hospitalization: on Has patient had a PCN reaction occurring within the last 10 years: no If all of the above answers are "NO", then may proceed with Cephalosporin use.      Medication List    TAKE these medications   acetaminophen 500 MG tablet Commonly known as:  TYLENOL Take 2 tablets (1,000 mg total) by mouth every 6 (six) hours as needed. What changed:  reasons to take this   albuterol 108 (90 Base) MCG/ACT inhaler Commonly known as:  PROVENTIL HFA;VENTOLIN HFA Inhale 2 puffs into the lungs every 6 (six) hours as needed for wheezing or shortness of breath.   allopurinol 100 MG tablet Commonly known as:  ZYLOPRIM Take 1 tablet (100 mg total) by mouth daily.   ALPRAZolam 0.5 MG tablet Commonly known as:  XANAX Take 1 tablet (0.5 mg total) by mouth 3 (three) times daily as needed for anxiety. To follow current prescription   beclomethasone 80 MCG/ACT inhaler Commonly known as:  QVAR Inhale 2 puffs into the lungs 2 (two) times daily. To use every single day as a prevention   buPROPion 150 MG 24 hr tablet Commonly known as:  WELLBUTRIN XL Take 1 tablet (150 mg total) by mouth 2 (two) times daily.   chlorthalidone 25 MG tablet Commonly known as:  HYGROTON Take 0.5 tablets (12.5 mg total) by mouth daily.   colchicine 0.6 MG tablet 1.2 mg at the first sign of flare, followed in  1 hour with a single dose of 0.6 mg (maximum: 1.8 mg within 1 hour).  Repeat as needed.   indomethacin 50 MG capsule Commonly known as:  INDOCIN Take 50 mg by mouth 2 (two) times daily with a meal.   metoprolol succinate 100 MG 24 hr tablet Commonly known as:  TOPROL-XL TAKE 1 TABLET (100 MG TOTAL) BY MOUTH DAILY. TAKE WITH OR IMMEDIATELY FOLLOWING A MEAL.   naproxen 375 MG tablet Commonly known as:  NAPROSYN Take 1 tablet (375 mg total) 2 (two) times daily with a meal by mouth.       Discharge Assessment: Vitals:   07/10/17 0412 07/10/17 0449  BP:  131/69  Pulse: (!) 54 (!) 50  Resp: 20 18  Temp:  98 F (36.7 C)  SpO2: 96% 95%   Skin clean, dry and intact without evidence of skin break down, no evidence of skin tears noted. IV catheter discontinued intact. Site without signs and symptoms of complications - no redness or edema noted at insertion site, patient denies c/o pain - only slight tenderness at site.  Dressing with slight pressure applied.  D/c Instructions-Education: Discharge instructions given to patient/family with verbalized understanding. D/c education completed with patient/family including follow up instructions, medication list, d/c activities limitations if indicated, with other d/c instructions as indicated by MD - patient able to verbalize understanding, all questions fully answered. Patient instructed to return to ED, call 911, or call  MD for any changes in condition.  Patient escorted via WC, and D/C home via private auto.  Camillo FlamingVicki L Gloriann Riede, RN 07/10/2017 11:51 AM

## 2017-07-10 NOTE — Progress Notes (Signed)
Pt BP 132/69, HR sustaining in low 40's, O2 88 and falling, rises when pt encouraged to breathe deeply, temperature 97.5. Pt observed to be very apneic when sleeping, stopping breathing for 15 seconds with O2 sats falling into low 80's. Raised head of bed and encouraged pt to breathe deeply. Notified MD on call regarding findings. Will continue to monitor.

## 2017-07-10 NOTE — Discharge Instructions (Signed)
Thank you for allowing us to provide your care. Please follow-up with your primary care physician as soon as possible.   Drug Overdose A drug overdose happens when you take too much of a drug. An overdose can occur with illegal drugs, prescription drugs, or over-the-counter (OTC) drugs. The effects of a drug overdose can be mild, dangerous, or even deadly. What are the causes? This condition may be caused by:  Taking too much of a drug by accident.  Taking too much of a drug on purpose.  An error made by a health care provider who prescribes a drug.  An error made by the pharmacist who fills the prescription order.  Drugs that commonly cause overdose include:  Mental health drugs.  Pain medicines.  Illegal drugs.  OTC cough and cold medicines.  Heart medicines.  Seizure medicines.  What increases the risk? A drug overdose is more likely in:  Children. They may be attracted to colorful pills. Because of a child's small size, even a small amount of a drug can be dangerous.  Elderly people. They may be taking many different drugs. Elderly people may have difficulty reading labels or remembering when they last took their medicine.  The risk of a drug overdose is also higher for someone who:  Takes illegal drugs.  Takes a drug and drinks alcohol.  Has a mental health condition.  What are the signs or symptoms? Symptoms of a drug overdose depend on the drug and the amount that was taken. Common danger signs include:  Behavior changes.  Sleepiness.  Slowed breathing.  Nausea and vomiting.  Seizures.  Changes in eye pupil size. The pupil may be very large or very small.  If there are signs and symptoms of very low blood pressure (shock) from an overdose, emergency treatment is required. These include:  Cold and clammy skin.  Pale skin.  Blue lips.  Very slow breathing.  Extreme sleepiness.  Loss of consciousness.  How is this diagnosed? This  condition may be diagnosed based on your symptoms. It is important to tell your health care provider:  All of the drugs that you took.  When you took the drugs.  Whether you were drinking alcohol.  Your health care provider will do a physical exam. This exam may include:  Checking and monitoring your heart rate and rhythm, your temperature, and your blood pressure (vital signs).  Checking your breathing and oxygen level.  You may also have tests, including:  Urine tests to check for drugs in your system.  Blood tests to check for: ? Drugs in your system. ? Signs of an imbalance of your blood minerals (electrolytes). ? Liver damage. ? Kidney damage.  How is this treated? Supporting your vital signs and your breathing is the first step in treating a drug overdose. Treatment may also include:  Giving fluids and electrolytes through an IV tube.  Inserting a breathing tube (endotracheal tube) in your airway to help you breathe.  Passing a tube through your nose and into your stomach (NG tube, or nasogastric tube) to wash out your stomach.  Giving medicines that: ? Make you vomit. ? Absorb any medicine that is left in your digestive system. ? Block or reverse the effect of the drug that caused the overdose.  Filtering your blood through an artificial kidney machine (hemodialysis). You may need this if your overdose is severe or if you have kidney failure.  Ongoing counseling and mental health support if you intentionally overdosed or used  an illegal drug.  Follow these instructions at home:  Take medicines only as directed by your health care provider. Always ask your health care provider about possible side effects of any new drug that you start taking.  Keep a list of all of the drugs that you take, including over-the-counter medicines. Bring this list with you to all of your medical visits.  Drink enough fluid to keep your urine clear or pale yellow.  Keep all follow-up  visits as directed by your health care provider. This is important. How is this prevented?  Get help if you are struggling with: ? Alcohol or drug use. ? Depression or another mental health problem.  Keep the phone number of your local poison control center near your phone or on your cell phone.  Store all medicines in safety containers that are out of the reach of children.  Read the drug inserts that come with your medicines.  Do not use illegal drugs.  Do not drink alcohol when taking drugs.  Do not take medicines that are not prescribed for you. Contact a health care provider if:  Your symptoms return.  You develop new symptoms or side effects when you take medicines. Get help right away if:  You think that you or someone else may have taken too much of a drug. The hotline of the [facility] is 920-042-7114.  You or someone else is having symptoms of a drug overdose.  You have serious thoughts about hurting yourself or others.  You become confused.  You have: ? Chest pain. ? Difficulty breathing. ? A loss of consciousness. Drug overdose is an emergency. Do not wait to see if the symptoms will go away. Get medical help right away. Call your local emergency services (911 in the [place] Do not drive yourself to the hospital. This information is not intended to replace advice given to you by your health care provider. Make sure you discuss any questions you have with your health care provider. Document Released: 10/30/2014 Document Revised: 06/02/202017 Document Reviewed: 06/20/2014 Elsevier Interactive Patient Education  [employer].

## 2017-07-13 ENCOUNTER — Emergency Department (HOSPITAL_COMMUNITY): Payer: BLUE CROSS/BLUE SHIELD

## 2017-07-13 ENCOUNTER — Other Ambulatory Visit: Payer: Self-pay

## 2017-07-13 ENCOUNTER — Emergency Department (HOSPITAL_COMMUNITY)
Admission: EM | Admit: 2017-07-13 | Discharge: 2017-07-13 | Disposition: A | Discharge status: Home / Self Care | Payer: BLUE CROSS/BLUE SHIELD | Attending: Emergency Medicine | Admitting: Emergency Medicine

## 2017-07-13 ENCOUNTER — Encounter (HOSPITAL_COMMUNITY): Payer: Self-pay

## 2017-07-13 DIAGNOSIS — Z87891 Personal history of nicotine dependence: Secondary | ICD-10-CM | POA: Insufficient documentation

## 2017-07-13 DIAGNOSIS — F419 Anxiety disorder, unspecified: Secondary | ICD-10-CM | POA: Insufficient documentation

## 2017-07-13 DIAGNOSIS — F329 Major depressive disorder, single episode, unspecified: Secondary | ICD-10-CM | POA: Insufficient documentation

## 2017-07-13 DIAGNOSIS — Z79899 Other long term (current) drug therapy: Secondary | ICD-10-CM | POA: Insufficient documentation

## 2017-07-13 DIAGNOSIS — R1310 Dysphagia, unspecified: Secondary | ICD-10-CM | POA: Insufficient documentation

## 2017-07-13 DIAGNOSIS — J4521 Mild intermittent asthma with (acute) exacerbation: Secondary | ICD-10-CM | POA: Insufficient documentation

## 2017-07-13 LAB — CBC WITH DIFFERENTIAL/PLATELET
BASOS ABS: 0 10*3/uL (ref 0.0–0.1)
Basophils Relative: 0 %
EOS PCT: 1 %
Eosinophils Absolute: 0.1 10*3/uL (ref 0.0–0.7)
HCT: 41.8 % (ref 39.0–52.0)
Hemoglobin: 14.4 g/dL (ref 13.0–17.0)
LYMPHS PCT: 21 %
Lymphs Abs: 1.5 10*3/uL (ref 0.7–4.0)
MCH: 32.1 pg (ref 26.0–34.0)
MCHC: 34.4 g/dL (ref 30.0–36.0)
MCV: 93.3 fL (ref 78.0–100.0)
MONO ABS: 0.4 10*3/uL (ref 0.1–1.0)
Monocytes Relative: 6 %
Neutro Abs: 5.1 10*3/uL (ref 1.7–7.7)
Neutrophils Relative %: 72 %
PLATELETS: 222 10*3/uL (ref 150–400)
RBC: 4.48 MIL/uL (ref 4.22–5.81)
RDW: 12.5 % (ref 11.5–15.5)
WBC: 7.2 10*3/uL (ref 4.0–10.5)

## 2017-07-13 LAB — BASIC METABOLIC PANEL
ANION GAP: 8 (ref 5–15)
BUN: 16 mg/dL (ref 6–20)
CALCIUM: 9.1 mg/dL (ref 8.9–10.3)
CO2: 25 mmol/L (ref 22–32)
CREATININE: 0.68 mg/dL (ref 0.61–1.24)
Chloride: 109 mmol/L (ref 101–111)
GFR calc Af Amer: >60 mL/min (ref >60)
GLUCOSE: 94 mg/dL (ref 65–99)
Potassium: 3.7 mmol/L (ref 3.5–5.1)
Sodium: 142 mmol/L (ref 135–145)

## 2017-07-13 LAB — I-STAT TROPONIN, ED: Troponin i, poc: 0 ng/mL (ref 0.00–0.08)

## 2017-07-13 MED ORDER — ALBUTEROL SULFATE HFA 108 (90 BASE) MCG/ACT IN AERS
1.0000 | INHALATION_SPRAY | Freq: Once | RESPIRATORY_TRACT | Status: AC
  Administered 2017-07-13: 1 via RESPIRATORY_TRACT
  Filled 2017-07-13: qty 6.7

## 2017-07-13 MED ORDER — PREDNISONE 20 MG PO TABS
60.0000 mg | ORAL_TABLET | Freq: Once | ORAL | Status: AC
  Administered 2017-07-13: 60 mg via ORAL
  Filled 2017-07-13: qty 3

## 2017-07-13 MED ORDER — PREDNISONE 10 MG PO TABS: 40.0000 mg | ORAL_TABLET | Freq: Every day | ORAL | 0 refills | Status: AC

## 2017-07-13 MED ORDER — GI COCKTAIL ~~LOC~~
30.0000 mL | Freq: Once | ORAL | Status: AC
  Administered 2017-07-13: 30 mL via ORAL
  Filled 2017-07-13: qty 30

## 2017-07-13 MED ORDER — RANITIDINE HCL 150 MG PO CAPS: 150.0000 mg | ORAL_CAPSULE | Freq: Every day | ORAL | 0 refills | Status: DC

## 2017-07-13 MED ORDER — SUCRALFATE 1 GM/10ML PO SUSP: 1.0000 g | Freq: Three times a day (TID) | ORAL | 0 refills | Status: DC

## 2017-07-13 MED ORDER — IPRATROPIUM-ALBUTEROL 0.5-2.5 (3) MG/3ML IN SOLN
3.0000 mL | Freq: Once | RESPIRATORY_TRACT | Status: AC
  Administered 2017-07-13: 3 mL via RESPIRATORY_TRACT
  Filled 2017-07-13: qty 3

## 2017-07-13 MED ORDER — ALBUTEROL SULFATE (2.5 MG/3ML) 0.083% IN NEBU
5.0000 mg | INHALATION_SOLUTION | Freq: Once | RESPIRATORY_TRACT | Status: AC
  Administered 2017-07-13: 5 mg via RESPIRATORY_TRACT
  Filled 2017-07-13: qty 6

## 2017-07-13 NOTE — ED Notes (Signed)
Bed: WTR7 Expected date:  Expected time:  Means of arrival:  Comments: 

## 2017-07-13 NOTE — ED Notes (Signed)
Patient tolerated PO intake

## 2017-07-13 NOTE — Discharge Instructions (Signed)
I think your shortness of breath, wheezing and chest tightness are from a mild asthma flare. Take prednisone for the next 5 days and use albuterol inhaler as prescribed. Return for any fevers, chest pain with movement or exertion or taking deep breaths.  Esophagitis or inflammation of your esophagus can cause pain with swallowing. Take Zantac 150 mg twice daily and Carafate suspension 30 minutes before meals. State to a soft diet for the next 3-5 days. Follow-up with your primary care doctor for reevaluation if your pain with swelling does not improve. Return for fevers, chills, vomiting, abdominal pain.

## 2017-07-13 NOTE — ED Provider Notes (Signed)
[facility]-EMERGENCY DEPT Provider Note   CSN: [id number] Arrival date & time: 07/13/17  1055     History   Chief Complaint Chief Complaint  Patient presents with  . Chest Pain  . Shortness of Breath    HPI Brandon Garza is a 42 y.o. male with history of acid reflux, asthma , anxiety , depression, hypertension, polysubstace abuse presents for evaluation of  shortness of breath described as becoming winded with prolonged walking. Associated with cough , wheezing, chest tightness, nasal congestion and runny nose for 3-4 days. Reports flu like symptoms 1-2 weeks ago, mostly improved however now cough and chest tightness lingering.  Feels like previous asthma flares. Has not been able to use albuterol due to lack of insurance and cost. Remote h/o cigarette use >5years ago. Aggravating factors include exertion. Alleviating factors include rest and breathing cold air. Also reports pain with swallowing solids to the middle of chest as food is going down. Now also having discomfort with cold fluids. No pain with swallowing saliva, soft foods, warm liquids.   Denies exertional chest pain, pleuritic or positional chest pain. No fevers, chills, nausea, vomiting, abdominal pain, diarrhea, constipation, dysuria. Admits to polysubstance abuse but denies IVD. Chart review reveals h/o heroin overdose.   HPI  Past Medical History:  Diagnosis Date  . Acid reflux   . Allergy   . Anxiety   . Arthritis   . Asthma   . Bronchitis   . Depression with anxiety   . H/O degenerative disc disease   . 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 Hypertension   . Neuromuscular disorder [facility])     Patient Active Problem List   Diagnosis Date Noted  . 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 Mild intermittent asthma with acute exacerbation 12/25/2016  . Viral URI with cough 06/28/2013  . GERD (gastroesophageal reflux disease) 10/20/2012  . Essential hypertension 02/12/2012  .  Stuttering 02/12/2012  . GAD (generalized anxiety disorder) 02/12/2012  . Nicotine use disorder 02/12/2012  . Chronic back pain 02/12/2012  . Hip dislocation, left (HCC) 02/12/2012  . Gout of multiple sites 08/04/2011    Past Surgical History:  Procedure Laterality Date  . CORNEAL TRANSPLANT     left eye  . DOBUTAMINE STRESS ECHO  10/12/2008   normal  . left hip surgery    . NASAL SEPTUM SURGERY    . orthopedic left arm surgery     done following an accident  . TONSILLECTOMY         Home Medications    Prior to Admission medications   Medication Sig Start Date End Date Taking? Authorizing Provider  acetaminophen (TYLENOL) 500 MG tablet Take 2 tablets (1,000 mg total) by mouth every 6 (six) hours as needed. Patient taking differently: Take 1,000 mg by mouth every 6 (six) hours as needed for headache (pain).  08/14/16  Yes Arby Barrette, MD  albuterol (PROVENTIL HFA;VENTOLIN HFA) 108 (90 Base) MCG/ACT inhaler Inhale 2 puffs into the lungs every 6 (six) hours as needed for wheezing or shortness of breath. 12/31/16  Yes Doristine Bosworth, MD  ALPRAZolam (XANAX) 0.5 MG tablet Take 1 tablet (0.5 mg total) by mouth 3 (three) times daily as needed for anxiety. To follow current prescription 12/25/16  Yes Collie Siad A, MD  beclomethasone (QVAR) 80 MCG/ACT inhaler Inhale 2 puffs into the lungs 2 (two) times daily. To use every single day as a prevention 05/01/16  Yes English,  Collie Siad, PA  buPROPion (WELLBUTRIN XL) 150 MG 24 hr tablet Take 1 tablet (150 mg total) by mouth 2 (two) times daily. 08/21/16  Yes Collie Siad A, MD  colchicine 0.6 MG tablet 1.2 mg at the first sign of flare, followed in 1 hour with a single dose of 0.6 mg (maximum: 1.8 mg within 1 hour).  Repeat as needed. Patient taking differently: Take 0.6 mg by mouth daily as needed (gout).  08/08/16  Yes Fayrene Helper, PA-C  indomethacin (INDOCIN) 50 MG capsule Take 50 mg by mouth 2 (two) times daily with a meal.   Yes  [provider]  metoprolol succinate (TOPROL-XL) 100 MG 24 hr tablet TAKE 1 TABLET (100 MG TOTAL) BY MOUTH DAILY. TAKE WITH OR IMMEDIATELY FOLLOWING A MEAL. 07/12/15  Yes Peyton Najjar, MD  naproxen (NAPROSYN) 375 MG tablet Take 1 tablet (375 mg total) 2 (two) times daily with a meal by mouth. 05/05/17  Yes Everlene Farrier, PA-C  allopurinol (ZYLOPRIM) 100 MG tablet Take 1 tablet (100 mg total) by mouth daily. Patient not taking: Reported on 07/09/2017 12/28/15   Collene Gobble, MD  chlorthalidone (HYGROTON) 25 MG tablet Take 0.5 tablets (12.5 mg total) by mouth daily. Patient not taking: Reported on 07/09/2017 03/23/16   Bing Neighbors, FNP  predniSONE (DELTASONE) 10 MG tablet Take 4 tablets (40 mg total) by mouth daily for 5 days. 07/13/17 07/18/17  Liberty Handy, PA-C  ranitidine (ZANTAC) 150 MG capsule Take 1 capsule (150 mg total) by mouth daily. 07/13/17   Liberty Handy, PA-C  sucralfate (CARAFATE) 1 GM/10ML suspension Take 10 mLs (1 g total) by mouth 4 (four) times daily -  with meals and at bedtime. 07/13/17   Liberty Handy, PA-C    Family History Family History  Problem Relation Age of Onset  . Heart Problems Father   . Stroke Maternal Grandfather   . Heart Problems Maternal Grandfather   . Mental illness Paternal Grandmother   . Heart Problems Paternal Grandfather     Social History Social History   Tobacco Use  . Smoking status: Former Smoker    Last attempt to quit: 07/22/2011    Years since quitting: 5.9  . Smokeless tobacco: Former User    Types: Snuff, Dorna Bloom    Quit date: 07/22/2011  Substance Use Topics  . Alcohol use: Yes    Alcohol/week: 3.0 - 6.0 oz    Types: 6 - 12 Standard drinks or equivalent per week    Comment: social  . Drug use: Yes    Types: Benzodiazepines, Cocaine, Marijuana     Allergies   Amoxicillin   Review of Systems Review of Systems  HENT: Positive for congestion, postnasal drip, rhinorrhea and trouble swallowing.     Respiratory: Positive for cough, chest tightness, shortness of breath and wheezing.   Gastrointestinal:       +mid chest pain with swallowing food and cold liquids   Skin: Negative for rash.  All other systems reviewed and are negative.    Physical Exam Updated Vital Signs BP (!) 154/90 (BP Location: Right Arm)   Pulse 67   Temp 98.7 F (37.1 C) (Oral)   Resp 20   Ht 5\' 10"  (1.778 m)   Wt 108 kg (238 lb)   SpO2 95%   BMI 34.15 kg/m   Physical Exam  Constitutional: He appears well-developed and well-nourished.  NAD. Non toxic.   HENT:  Head: Normocephalic and atraumatic.  Nose: Nose  normal.  Moist mucous membranes. Tonsils and oropharynx normal  Eyes: Conjunctivae, EOM and lids are normal.  Neck: Trachea normal and normal range of motion.  Trachea midline. No cervical adenopathy  Cardiovascular: Normal rate, regular rhythm, S1 normal, S2 normal and normal heart sounds.  Pulses:      Carotid pulses are 2+ on the right side, and 2+ on the left side.      Radial pulses are 2+ on the right side, and 2+ on the left side.       Dorsalis pedis pulses are 2+ on the right side, and 2+ on the left side.  RRR. No murmurs. No orthopnea. No LE edema or calf tenderness.   Pulmonary/Chest: Effort normal and breath sounds normal. No respiratory distress. He has no decreased breath sounds. He has no rhonchi.  No reproducible chest wall tenderness. CP not reproducible with AROM of upper extremities. No rales or wheezing.  Abdominal: Soft. Bowel sounds are normal. There is no tenderness.  No epigastric tenderness. No distention.   Neurological: He is alert. GCS eye subscore is 4. GCS verbal subscore is 5. GCS motor subscore is 6.  Skin: Skin is warm and dry. Capillary refill takes less than 2 seconds.  No rash to chest wall. No skin changes, petechiae or rash to finger nails or hands.   Psychiatric: He has a normal mood and affect. His speech is normal and behavior is normal. Judgment and  thought content normal. Cognition and memory are normal.     ED Treatments / Results  Labs (all labs ordered are listed, but only abnormal results are displayed) Labs Reviewed  CBC WITH DIFFERENTIAL/PLATELET  BASIC METABOLIC PANEL  I-STAT TROPONIN, ED    EKG  EKG Interpretation None       Radiology Dg Chest 2 View  Result Date: 07/13/2017 CLINICAL DATA:  Shortness of breath. EXAM: CHEST  2 VIEW COMPARISON:  07/09/2017 FINDINGS: The heart size and mediastinal contours are within normal limits. Both lungs are clear. The visualized skeletal structures are unremarkable. IMPRESSION: No active cardiopulmonary disease. Electronically Signed   By: Signa Kellaylor  Stroud M.D.   On: 07/13/2017 11:58    Procedures Procedures (including critical care time)  Medications Ordered in ED Medications  albuterol (PROVENTIL) (2.5 MG/3ML) 0.083% nebulizer solution 5 mg (5 mg Nebulization Given 07/13/17 1208)  gi cocktail (Maalox,Lidocaine,Donnatal) (30 mLs Oral Given 07/13/17 1522)  ipratropium-albuterol (DUONEB) 0.5-2.5 (3) MG/3ML nebulizer solution 3 mL (3 mLs Nebulization Given 07/13/17 1440)  predniSONE (DELTASONE) tablet 60 mg (60 mg Oral Given 07/13/17 1521)  albuterol (PROVENTIL HFA;VENTOLIN HFA) 108 (90 Base) MCG/ACT inhaler 1 puff (1 puff Inhalation Given 07/13/17 1618)     Initial Impression / Assessment and Plan / ED Course  I have reviewed the triage vital signs and the nursing notes.  Pertinent labs & imaging results that were available during my care of the patient were reviewed by me and considered in my medical decision making (see chart for details).    42 yo with shortness of breath, cough, congestion, rhinorrhea, wheezing, chest tightness. H/o asthma. Non compliant with albuterol inhaler due to cost. Also reports pain with swallowing solids. Symptoms for 3-4 days. No exertional, positional, pleuritic CP. No fevers, chills, abdominal pain. Documented h/o heroin use, however he denies  this.   Exam reassuring. Cardiac and abdominal exam non contributory. Does not look fluid overloaded. No splinter hemorrhages. He has diffuse wheezing in all fields but no hypoxia, tachypnea or tachycardia. Lung exam significantly improved  after duoneb and prednisone. GI cocktail given. Considering asthma exacerbation and esophagitis. Considered myocarditis, pericarditis, endocarditis, ACS, PE, PNA however these less likely given clinical picture. He was recently admitted for opioid overdose and overnight observation was unremarkable. Given h/o IVDU will obtain screenings lab and reassess.  Final Clinical Impressions(s) / ED Diagnoses   1600: re-evaluated pt. He has been ambulatory w/o hypoxia or tachypnea or SOB. GI cocktail has significantly improved odynophagia. Has tolearted PO challenge w/o pain. Lab work including chest x-ray, EKG, CBC, BMP and troponin unremarkable. History and workup not consistent with myocarditis or pericarditis. Will d/c at this time with prednisone and albuterol inhaler. Zantac, carafate and GI f/u for odynophagia. Discussed high risk for myocarditis/pericarditis given remote h/o of IVDU. Discussed specific s/s that would warrant return to ED. Pt eager to be discharged, he verbalized undesrtanding and agreeable with plan.  Final diagnoses:  Mild intermittent asthma with exacerbation  Odynophagia    ED Discharge Orders        Ordered    predniSONE (DELTASONE) 10 MG tablet  Daily     07/13/17 1618    sucralfate (CARAFATE) 1 GM/10ML suspension  3 times daily with meals & bedtime     07/13/17 1618    ranitidine (ZANTAC) 150 MG capsule  Daily     07/13/17 1618       Liberty Handy, PA-C 07/13/17 1621    Raeford Razor, MD 07/13/17 [phone]

## 2017-07-13 NOTE — ED Triage Notes (Addendum)
Pt c/o sharp stabbing intermittent central chest pain especially while eating/while swallowing x3-4 days and shortness of breath. Pt experienced body aches, congestion, and "flu like sx" the past week, per pt. Hx of asthma. Pt ahs used albuterol inhaler in the past but does not currently have any asthma medications. Expiratory wheezes noted bilaterally

## 2017-08-04 ENCOUNTER — Inpatient Hospital Stay: Payer: BLUE CROSS/BLUE SHIELD | Admitting: Critical Care Medicine

## 2017-08-05 ENCOUNTER — Other Ambulatory Visit: Payer: Self-pay | Admitting: Family Medicine

## 2017-08-05 DIAGNOSIS — F411 Generalized anxiety disorder: Secondary | ICD-10-CM

## 2017-08-05 DIAGNOSIS — F8081 Childhood onset fluency disorder: Secondary | ICD-10-CM

## 2017-08-05 MED ORDER — ALBUTEROL SULFATE HFA 108 (90 BASE) MCG/ACT IN AERS: 2.0000 | INHALATION_SPRAY | Freq: Four times a day (QID) | RESPIRATORY_TRACT | 3 refills | Status: DC | PRN

## 2017-08-05 NOTE — Telephone Encounter (Signed)
Xanax 0.5 mg refill Last OV: 12/25/16 Last Refill:12/25/16 90 tab/5 refills Pharmacy:[facility] [phone]  Albuterol filled 08/05/17.

## 2017-08-05 NOTE — Addendum Note (Signed)
Addended by: Oneida AlarPETERSON, Milena Liggett on: 08/05/2017 03:18 PM   Modules accepted: Orders

## 2017-08-05 NOTE — Telephone Encounter (Signed)
Copied from CRM [id number]. Topic: Quick Communication - Rx Refill/Question >> Aug 05, 2017 12:04 PM [name], [name], NT wrote: Medication: albuterol (PROVENTIL HFA;VENTOLIN HFA) 108 (90 Base) MCG/ACT inhaler and ALPRAZolam (XANAX) 0.5 MG tablet   Has the patient contacted their pharmacy? Yes    (Agent: If no, request that the patient contact the pharmacy for the refill.)   Preferred Pharmacy (with phone number or street name): [facility] [phone] - [place], [place] - [address] [facility] [address] [facility] [phone] (Phone) [phone] (Fax)     Agent: Please be advised that RX refills may take up to 3 business days. We ask that you follow-up with your pharmacy.

## 2017-08-19 ENCOUNTER — Ambulatory Visit: Payer: BLUE CROSS/BLUE SHIELD | Admitting: Family Medicine

## 2017-08-25 ENCOUNTER — Encounter: Payer: Self-pay | Admitting: Critical Care Medicine

## 2017-08-25 ENCOUNTER — Ambulatory Visit: Payer: Self-pay | Attending: Critical Care Medicine | Admitting: Critical Care Medicine

## 2017-08-25 ENCOUNTER — Other Ambulatory Visit: Payer: Self-pay

## 2017-08-25 VITALS — BP 132/79 | HR 98 | Temp 98.2°F | Resp 12 | Ht 70.0 in | Wt 233.0 lb

## 2017-08-25 DIAGNOSIS — I10 Essential (primary) hypertension: Secondary | ICD-10-CM

## 2017-08-25 DIAGNOSIS — F8081 Childhood onset fluency disorder: Secondary | ICD-10-CM

## 2017-08-25 DIAGNOSIS — Z87898 Personal history of other specified conditions: Secondary | ICD-10-CM

## 2017-08-25 DIAGNOSIS — Z79899 Other long term (current) drug therapy: Secondary | ICD-10-CM | POA: Insufficient documentation

## 2017-08-25 DIAGNOSIS — K219 Gastro-esophageal reflux disease without esophagitis: Secondary | ICD-10-CM

## 2017-08-25 DIAGNOSIS — F17200 Nicotine dependence, unspecified, uncomplicated: Secondary | ICD-10-CM

## 2017-08-25 DIAGNOSIS — M1000 Idiopathic gout, unspecified site: Secondary | ICD-10-CM

## 2017-08-25 DIAGNOSIS — J209 Acute bronchitis, unspecified: Secondary | ICD-10-CM

## 2017-08-25 DIAGNOSIS — J4521 Mild intermittent asthma with (acute) exacerbation: Secondary | ICD-10-CM

## 2017-08-25 DIAGNOSIS — Z87891 Personal history of nicotine dependence: Secondary | ICD-10-CM | POA: Insufficient documentation

## 2017-08-25 DIAGNOSIS — Z8659 Personal history of other mental and behavioral disorders: Secondary | ICD-10-CM

## 2017-08-25 DIAGNOSIS — F1191 Opioid use, unspecified, in remission: Secondary | ICD-10-CM

## 2017-08-25 DIAGNOSIS — F411 Generalized anxiety disorder: Secondary | ICD-10-CM

## 2017-08-25 DIAGNOSIS — M1A09X0 Idiopathic chronic gout, multiple sites, without tophus (tophi): Secondary | ICD-10-CM

## 2017-08-25 MED ORDER — METOPROLOL SUCCINATE ER 100 MG PO TB24: ORAL_TABLET | ORAL | 3 refills | Status: DC

## 2017-08-25 MED ORDER — CHLORTHALIDONE 25 MG PO TABS: 12.5000 mg | ORAL_TABLET | Freq: Every day | ORAL | 6 refills | Status: DC

## 2017-08-25 MED ORDER — ALPRAZOLAM 0.5 MG PO TABS: 0.5000 mg | ORAL_TABLET | Freq: Three times a day (TID) | ORAL | 5 refills | Status: DC | PRN

## 2017-08-25 MED ORDER — FAMOTIDINE 20 MG PO TABS: 20.0000 mg | ORAL_TABLET | Freq: Two times a day (BID) | ORAL | 6 refills | Status: DC

## 2017-08-25 MED ORDER — PREDNISONE 10 MG PO TABS: ORAL_TABLET | ORAL | 0 refills | Status: DC

## 2017-08-25 MED ORDER — ALLOPURINOL 100 MG PO TABS: 100.0000 mg | ORAL_TABLET | Freq: Every day | ORAL | 11 refills | Status: DC

## 2017-08-25 MED ORDER — ALBUTEROL SULFATE HFA 108 (90 BASE) MCG/ACT IN AERS: 2.0000 | INHALATION_SPRAY | Freq: Four times a day (QID) | RESPIRATORY_TRACT | 3 refills | Status: DC | PRN

## 2017-08-25 MED ORDER — COLCHICINE 0.6 MG PO TABS: ORAL_TABLET | ORAL | 3 refills | Status: DC

## 2017-08-25 MED ORDER — BUPROPION HCL ER (XL) 150 MG PO TB24
150.0000 mg | ORAL_TABLET | Freq: Every day | ORAL | 11 refills | Status: DC
Start: 2017-08-25 — End: 2018-09-14

## 2017-08-25 MED ORDER — FLUTICASONE PROPIONATE HFA 110 MCG/ACT IN AERO: 2.0000 | INHALATION_SPRAY | Freq: Two times a day (BID) | RESPIRATORY_TRACT | 12 refills | Status: DC

## 2017-08-25 MED ORDER — AZITHROMYCIN 250 MG PO TABS: 250.0000 mg | ORAL_TABLET | Freq: Every day | ORAL | 0 refills | Status: DC
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
## 2017-08-25 NOTE — Progress Notes (Signed)
Tightness in chest on right side of chest to costal area x 1 week. Notices when being more active  EMS was called at his POE last night  Out of most of his medications. Has 2 weeks left of Metoprolol

## 2017-08-25 NOTE — Patient Instructions (Addendum)
Start Prednisone 10mg  Take 4 for three days 3 for three days 2 for three days 1 for three days and stop Start azithromycin 250mg  Take two once then one daily until gone Start Flovent 110 two puff twice daily Use albuterol as needed 2puff every 4 hours Start Pepcid 20mg  twice daily Refills sent on buproprion, colchicine, allopurinol, alprazolam, metoprolol, chlorthalidone Return one month March 20 A primary care appointment will be established A Chest xray will be obtained

## 2017-08-26 ENCOUNTER — Encounter: Payer: Self-pay | Admitting: Critical Care Medicine

## 2017-08-26 NOTE — Assessment & Plan Note (Signed)
See tracheobronchitis assessment

## 2017-08-26 NOTE — Assessment & Plan Note (Addendum)
Acute tracheobronchitis with flare Plan  Azithromycin 250mg  Take two once then one daily until gone Pulse prednisone 10mg  Take 4 for three days 3 for three days 2 for three days 1 for three days and stop Obtain Flovent 110 two puff bid Prn SABA Recommend CXR

## 2017-08-26 NOTE — Assessment & Plan Note (Addendum)
Refill xanax and bupropion

## 2017-08-26 NOTE — Assessment & Plan Note (Signed)
Rx Zantac for GERD, likely contributor to asthma flare

## 2017-08-26 NOTE — Assessment & Plan Note (Signed)
Refill HTN meds [facility] will [facility]

## 2017-08-26 NOTE — Assessment & Plan Note (Signed)
Avoid any opiates,  Has already used heroin,  Likely using more than will admit Declines a contract to attend [facility] services

## 2017-08-26 NOTE — Assessment & Plan Note (Signed)
Refill gout meds

## 2017-08-27 MED FILL — ?FAMOTIDINE 20 MG TABLET: 20 | 30 days supply | Qty: 60 | Fill #0

## 2017-08-27 MED FILL — COLCHICINE 0.6 MG TABS: 0.6 | 5 days supply | Qty: 12 | Fill #0

## 2017-08-27 MED FILL — ?ALLOPURINOL 100 MG TABS: 100 | 30 days supply | Qty: 30 | Fill #0

## 2017-08-27 MED FILL — BUPROPION HCL XL 150 MG TAB: 150 | 30 days supply | Qty: 30 | Fill #0

## 2017-08-27 MED FILL — **FLOVENT HFA 110 MCG INHAL: 110 | 30 days supply | Qty: 12 | Fill #0

## 2017-08-27 MED FILL — ?CHLORTHALIDONE 25 MG TABLE: 25 | 30 days supply | Qty: 15 | Fill #0

## 2017-08-27 MED FILL — METOPROLOL SUCCINATE ER 100: 100 | 30 days supply | Qty: 30 | Fill #0

## 2017-08-27 MED FILL — AZITHROMYCIN 250 MG TABLET: 250 | 5 days supply | Qty: 6 | Fill #0

## 2017-08-27 MED FILL — ALBUTEROL SULFATE HFA 108 (: 108 (90 BAS | 25 days supply | Qty: 1 | Fill #0

## 2017-08-27 MED FILL — predniSONE 10 MG TABS: 10 | 12 days supply | Qty: 30 | Fill #0

## 2017-09-10 ENCOUNTER — Other Ambulatory Visit: Payer: Self-pay

## 2017-09-10 ENCOUNTER — Emergency Department (HOSPITAL_COMMUNITY): Payer: BLUE CROSS/BLUE SHIELD

## 2017-09-10 ENCOUNTER — Emergency Department (HOSPITAL_COMMUNITY)
Admission: EM | Admit: 2017-09-10 | Discharge: 2017-09-10 | Disposition: A | Discharge status: Home / Self Care | Payer: BLUE CROSS/BLUE SHIELD | Attending: Emergency Medicine | Admitting: Emergency Medicine

## 2017-09-10 DIAGNOSIS — J181 Lobar pneumonia, unspecified organism: Secondary | ICD-10-CM | POA: Insufficient documentation

## 2017-09-10 DIAGNOSIS — J90 Pleural effusion, not elsewhere classified: Secondary | ICD-10-CM

## 2017-09-10 DIAGNOSIS — Z87891 Personal history of nicotine dependence: Secondary | ICD-10-CM | POA: Insufficient documentation

## 2017-09-10 DIAGNOSIS — E876 Hypokalemia: Secondary | ICD-10-CM

## 2017-09-10 DIAGNOSIS — J918 Pleural effusion in other conditions classified elsewhere: Secondary | ICD-10-CM | POA: Insufficient documentation

## 2017-09-10 DIAGNOSIS — J4520 Mild intermittent asthma, uncomplicated: Secondary | ICD-10-CM | POA: Insufficient documentation

## 2017-09-10 DIAGNOSIS — J189 Pneumonia, unspecified organism: Secondary | ICD-10-CM

## 2017-09-10 DIAGNOSIS — I10 Essential (primary) hypertension: Secondary | ICD-10-CM | POA: Insufficient documentation

## 2017-09-10 DIAGNOSIS — Z79899 Other long term (current) drug therapy: Secondary | ICD-10-CM | POA: Insufficient documentation

## 2017-09-10 LAB — BASIC METABOLIC PANEL WITH GFR
Anion gap: 14 (ref 5–15)
BUN: 8 mg/dL (ref 6–20)
CO2: 18 mmol/L — ABNORMAL LOW (ref 22–32)
Calcium: 8.8 mg/dL — ABNORMAL LOW (ref 8.9–10.3)
Chloride: 108 mmol/L (ref 101–111)
Creatinine, Ser: 0.68 mg/dL (ref 0.61–1.24)
GFR calc Af Amer: >60 mL/min
GFR calc non Af Amer: >60 mL/min
Glucose, Bld: 118 mg/dL — ABNORMAL HIGH (ref 65–99)
Potassium: 3.3 mmol/L — ABNORMAL LOW (ref 3.5–5.1)
Sodium: 140 mmol/L (ref 135–145)

## 2017-09-10 LAB — CBC
HCT: 42.8 % (ref 39.0–52.0)
Hemoglobin: 13.9 g/dL (ref 13.0–17.0)
MCH: 31 pg (ref 26.0–34.0)
MCHC: 32.5 g/dL (ref 30.0–36.0)
MCV: 95.5 fL (ref 78.0–100.0)
Platelets: 339 10*3/uL (ref 150–400)
RBC: 4.48 MIL/uL (ref 4.22–5.81)
RDW: 13.7 % (ref 11.5–15.5)
WBC: 11.2 10*3/uL — ABNORMAL HIGH (ref 4.0–10.5)

## 2017-09-10 LAB — I-STAT TROPONIN, ED: Troponin i, poc: 0.01 ng/mL (ref 0.00–0.08)

## 2017-09-10 MED ORDER — POTASSIUM CHLORIDE CRYS ER 20 MEQ PO TBCR
40.0000 meq | EXTENDED_RELEASE_TABLET | Freq: Once | ORAL | Status: AC
  Administered 2017-09-10: 40 meq via ORAL
  Filled 2017-09-10: qty 2

## 2017-09-10 MED ORDER — LEVOFLOXACIN 750 MG PO TABS
750.0000 mg | ORAL_TABLET | Freq: Once | ORAL | Status: AC
  Administered 2017-09-10: 750 mg via ORAL
  Filled 2017-09-10: qty 1

## 2017-09-10 MED ORDER — LEVOFLOXACIN 750 MG PO TABS: 750.0000 mg | ORAL_TABLET | Freq: Every day | ORAL | 0 refills | Status: DC

## 2017-09-10 MED ORDER — ASPIRIN 81 MG PO CHEW: 324.0000 mg | CHEWABLE_TABLET | Freq: Once | ORAL | Status: DC
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
## 2017-09-10 NOTE — ED Triage Notes (Signed)
Pt states 1 and a half week ago he began having a cough with pain to right chest. Took a Zpack. Pt states he does not feel better and still has pain to his right chest.

## 2017-09-10 NOTE — ED Notes (Signed)
E signature broken. Patient verbally acknowledges discharge instructions and has no further questions at this time.

## 2017-09-10 NOTE — ED Provider Notes (Signed)
Right-sided chest pain worse with coughing for the past 10 days.  He admits to shortness of breath after he walks approximately half mile.  He denies fever but admits to night sweats and he was treated with a "Z-Pak" recently for same complaint and felt better for approximately 2 days.  On exam he is alert and in no respiratory distress.  Speaks in paragraphs.  Lungs clear to auscultation. Chest x-ray reviewed by me   Brandon Garza, Brandon Lindseth, MD 09/10/17 2131

## 2017-09-10 NOTE — Discharge Instructions (Signed)
Please take antibiotics as prescribed until all gone. Follow up with Dr. Delford FieldWright or a primary care doctor in 3 days. Return if worsening symptoms.

## 2017-09-10 NOTE — ED Provider Notes (Signed)
[facility] EMERGENCY DEPARTMENT Provider Note   CSN: [id number] Arrival date & time: 09/10/17  1520     History   Chief Complaint Chief Complaint  Patient presents with  . Chest Pain    HPI Brandon Garza is a 42 y.o. male.  HPI Brandon Garza is a 42 y.o. male with a history of depression, anxiety, arthritis, asthma, hypertension, presents to emergency department with complaint of cough, shortness of breath, right-sided chest pain.  Patient states he went to see his family doctor and pulmonologist 2 weeks ago.  He was given prescription for azithromycin and steroids.  He states he finished both medications and felt better.  He states shortly after feeling better, he states his symptoms started getting worse again.  He is now complaining of cough, pain on the right side of the chest, denies any swelling in extremities.  He has been taking his inhalers and regular medications as prescribed.  Denies any urinary symptoms.  No abdominal pain.  No back pain.  Past Medical History:  Diagnosis Date  . Acid reflux   . Allergy   . Anxiety   . Arthritis   . Asthma   . Bronchitis   . Depression with anxiety   . H/O degenerative disc disease   . 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 Hypertension   . Neuromuscular disorder [facility])     Patient Active Problem List   Diagnosis Date Noted  . Acute tracheobronchitis 08/25/2017  . History of heroin use 07/09/2017  . Mild intermittent asthma with acute exacerbation 12/25/2016  . GERD (gastroesophageal reflux disease) 10/20/2012  . Essential hypertension 02/12/2012  . Stuttering 02/12/2012  . GAD (generalized anxiety disorder) 02/12/2012  . Nicotine use disorder 02/12/2012  . Chronic back pain 02/12/2012  . Hip dislocation, left (HCC) 02/12/2012  . Gout of multiple sites 08/04/2011    Past Surgical History:  Procedure Laterality Date  . CORNEAL TRANSPLANT     left eye  . DOBUTAMINE STRESS ECHO   10/12/2008   normal  . left hip surgery    . NASAL SEPTUM SURGERY    . orthopedic left arm surgery     done following an accident  . TONSILLECTOMY         Home Medications    Prior to Admission medications   Medication Sig Start Date End Date Taking? Authorizing Provider  acetaminophen (TYLENOL) 500 MG tablet Take 2 tablets (1,000 mg total) by mouth every 6 (six) hours as needed. Patient taking differently: Take 1,000 mg by mouth every 6 (six) hours as needed for headache (pain).  08/14/16   Arby Barrette, MD  albuterol (PROVENTIL HFA;VENTOLIN HFA) 108 (90 Base) MCG/ACT inhaler Inhale 2 puffs into the lungs every 6 (six) hours as needed for wheezing or shortness of breath. 08/25/17   Storm Frisk, MD  allopurinol (ZYLOPRIM) 100 MG tablet Take 1 tablet (100 mg total) by mouth daily. 08/25/17   Storm Frisk, MD  ALPRAZolam Prudy Feeler) 0.5 MG tablet Take 1 tablet (0.5 mg total) by mouth 3 (three) times daily as needed for anxiety. 08/25/17   Storm Frisk, MD  azithromycin (ZITHROMAX) 250 MG tablet Take 1 tablet (250 mg total) by mouth daily. Take two once then one daily until gone 08/25/17   Storm Frisk, MD  buPROPion (WELLBUTRIN XL) 150 MG 24 hr tablet Take 1 tablet (150 mg total) by mouth daily. 08/25/17   Storm Frisk,  MD  chlorthalidone (HYGROTON) 25 MG tablet Take 0.5 tablets (12.5 mg total) by mouth daily. 08/25/17   Storm Frisk, MD  colchicine 0.6 MG tablet 1.2 mg at the first sign of flare, followed in 1 hour with a single dose of 0.6 mg (maximum: 1.8 mg within 1 hour).  Repeat as needed. 08/25/17   Storm Frisk, MD  famotidine (PEPCID) 20 MG tablet Take 1 tablet (20 mg total) by mouth 2 (two) times daily. 08/25/17   Storm Frisk, MD  fluticasone (FLOVENT HFA) 110 MCG/ACT inhaler Inhale 2 puffs into the lungs 2 (two) times daily. 08/25/17   Storm Frisk, MD  indomethacin (INDOCIN) 50 MG capsule Take 50 mg by mouth 2 (two) times daily with a meal.     [provider]  metoprolol succinate (TOPROL-XL) 100 MG 24 hr tablet TAKE 1 TABLET (100 MG TOTAL) BY MOUTH DAILY. TAKE WITH OR IMMEDIATELY FOLLOWING A MEAL. 08/25/17   Storm Frisk, MD  predniSONE (DELTASONE) 10 MG tablet Take 4 for three days 3 for three days 2 for three days 1 for three days and stop 08/25/17   Storm Frisk, MD  tadalafil (CIALIS) 20 MG tablet Take 1 tablet (20 mg total) by mouth daily as needed for erectile dysfunction. Patient not taking: Reported on 03/20/2015 12/07/14 04/17/15  Tonye Pearson, MD  temazepam (RESTORIL) 30 MG capsule Take 1 capsule (30 mg total) by mouth at bedtime as needed for sleep. Patient taking differently: Take 30 mg by mouth at bedtime as needed for sleep.  12/07/14 04/17/15  Tonye Pearson, MD    Family History Family History  Problem Relation Age of Onset  . Heart Problems Father   . Stroke Maternal Grandfather   . Heart Problems Maternal Grandfather   . Mental illness Paternal Grandmother   . Heart Problems Paternal Grandfather     Social History Social History   Tobacco Use  . Smoking status: Former Smoker    Last attempt to quit: 07/22/2011    Years since quitting: 6.1  . Smokeless tobacco: Former User    Types: Snuff, Dorna Bloom    Quit date: 07/22/2011  Substance Use Topics  . Alcohol use: Yes    Alcohol/week: 3.0 - 6.0 oz    Types: 6 - 12 Standard drinks or equivalent per week    Comment: social  . Drug use: Yes    Types: Benzodiazepines, Cocaine, Marijuana     Allergies   Amoxicillin   Review of Systems Review of Systems  Constitutional: Negative for chills and fever.  Respiratory: Positive for cough, chest tightness and shortness of breath.   Cardiovascular: Positive for chest pain. Negative for palpitations and leg swelling.  Gastrointestinal: Negative for abdominal distention, abdominal pain, diarrhea, nausea and vomiting.  Genitourinary: Negative for dysuria, frequency, hematuria and urgency.    Musculoskeletal: Negative for arthralgias, myalgias, neck pain and neck stiffness.  Skin: Negative for rash.  Allergic/Immunologic: Negative for immunocompromised state.  Neurological: Negative for dizziness, weakness, light-headedness, numbness and headaches.  All other systems reviewed and are negative.    Physical Exam Updated Vital Signs BP (!) 146/89 (BP Location: Right Arm)   Pulse 68   Temp (!) 97.5 F (36.4 C) (Oral)   Resp 17   Ht 5\' 10"  (1.778 m)   Wt 104.3 kg (230 lb)   SpO2 97%   BMI 33.00 kg/m   Physical Exam  Constitutional: He appears well-developed and well-nourished. No distress.  HENT:  Head: Normocephalic and atraumatic.  Eyes: Conjunctivae are normal.  Neck: Neck supple.  Cardiovascular: Normal rate, regular rhythm and normal heart sounds.  Pulmonary/Chest: Effort normal. No respiratory distress. He has wheezes. He has no rales. He exhibits no tenderness.  Decreased air movement bilaterally, decreased breath sounds at the right base.  Mild expiratory wheezes all over  Abdominal: Soft. Bowel sounds are normal. He exhibits no distension. There is no tenderness. There is no rebound.  Musculoskeletal: He exhibits no edema.  Neurological: He is alert.  Skin: Skin is warm and dry.  Nursing note and vitals reviewed.    ED Treatments / Results  Labs (all labs ordered are listed, but only abnormal results are displayed) Labs Reviewed  BASIC METABOLIC PANEL - Abnormal; Notable for the following components:      Result Value   Potassium 3.3 (*)    CO2 18 (*)    Glucose, Bld 118 (*)    Calcium 8.8 (*)    All other components within normal limits  CBC - Abnormal; Notable for the following components:   WBC 11.2 (*)    All other components within normal limits  I-STAT TROPONIN, ED    EKG  EKG Interpretation None       Radiology Dg Chest 2 View  Result Date: 09/10/2017 CLINICAL DATA:  Cough and right chest pain. EXAM: CHEST - 2 VIEW COMPARISON:   07/13/2017 FINDINGS: The cardiomediastinal silhouette is within normal limits. There is new airspace opacity in the lateral and basilar right lung, predominantly involving the middle lobe. Apparent new elevation of the right hemidiaphragm likely represents a subpulmonic effusion, and there may also be partially loculated pleural fluid extending along the lateral right hemithorax. The left lung is clear. No pneumothorax is identified. No acute osseous abnormality is seen. IMPRESSION: New right middle lobe airspace opacity concerning for pneumonia, likely with a loculated pleural effusion which is predominantly subpulmonic. Electronically Signed   By: Sebastian Ache M.D.   On: 09/10/2017 16:50    Procedures Procedures (including critical care time)  Medications Ordered in ED Medications  aspirin chewable tablet 324 mg (324 mg Oral Not Given 09/10/17 2029)     Initial Impression / Assessment and Plan / ED Course  I have reviewed the triage vital signs and the nursing notes.  Pertinent labs & imaging results that were available during my care of the patient were reviewed by me and considered in my medical decision making (see chart for details).     Patient diagnosed with bronchitis 2 weeks ago.  Treated with Z-Pak.  Felt better.  Started to get worse again several days ago.  On today's x-ray, patient has new middle lobe airspace opacity concerning for pneumonia with loculated pleural effusion.  Discussed case with Dr. Rennis Chris.  Patient is in no acute distress, otherwise normal vital signs.  I ambulated patient myself in the hallway with pulse ox, patient maintain oxygen saturation above 97%.  He did not appear to be short of breath.  We will plan to restart antibiotics, this time we will treat with Levaquin.  I have sent a note to Dr. Delford Field, the pulmonologist patient just saw, for close follow up. Return precautions discussed.   Vitals:   09/10/17 1528 09/10/17 1755 09/10/17 2033 09/10/17 2228    BP: (!) 150/75 (!) 167/80 (!) 146/89 (!) 164/93  Pulse: 80 84 68 73  Resp: 17 16 17 17   Temp: 98.1 F (36.7 C) (!) 97.5 F (36.4 C)  TempSrc: Oral Oral    SpO2: 97% 97% 97% 98%  Weight:      Height:         Final Clinical Impressions(s) / ED Diagnoses   Final diagnoses:  Community acquired pneumonia of right middle lobe of lung (HCC)  Pleural effusion  Hypokalemia    ED Discharge Orders        Ordered    levofloxacin (LEVAQUIN) 750 MG tablet  Daily     09/10/17 2211       Jaynie CrumbleKirichenko, Mayme Profeta, PA-C 09/10/17 2350    Doug SouJacubowitz, Sam, MD 09/11/17 [phone]

## 2017-09-13 ENCOUNTER — Telehealth: Payer: Self-pay

## 2017-09-13 NOTE — Telephone Encounter (Signed)
Called pt. And scheduled a f/u appt with Dr. Delford FieldWright on 09/15/17 @ 12

## 2017-09-15 ENCOUNTER — Other Ambulatory Visit: Payer: Self-pay

## 2017-09-15 ENCOUNTER — Encounter: Payer: Self-pay | Admitting: Critical Care Medicine

## 2017-09-15 ENCOUNTER — Ambulatory Visit: Payer: Self-pay | Attending: Critical Care Medicine | Admitting: Critical Care Medicine

## 2017-09-15 VITALS — BP 102/63 | HR 55 | Temp 98.8°F | Resp 18 | Ht 70.0 in | Wt 234.2 lb

## 2017-09-15 DIAGNOSIS — Z880 Allergy status to penicillin: Secondary | ICD-10-CM | POA: Insufficient documentation

## 2017-09-15 DIAGNOSIS — J189 Pneumonia, unspecified organism: Secondary | ICD-10-CM

## 2017-09-15 DIAGNOSIS — K219 Gastro-esophageal reflux disease without esophagitis: Secondary | ICD-10-CM | POA: Insufficient documentation

## 2017-09-15 DIAGNOSIS — I10 Essential (primary) hypertension: Secondary | ICD-10-CM | POA: Insufficient documentation

## 2017-09-15 DIAGNOSIS — Z79899 Other long term (current) drug therapy: Secondary | ICD-10-CM | POA: Insufficient documentation

## 2017-09-15 DIAGNOSIS — Z87891 Personal history of nicotine dependence: Secondary | ICD-10-CM | POA: Insufficient documentation

## 2017-09-15 DIAGNOSIS — J4521 Mild intermittent asthma with (acute) exacerbation: Secondary | ICD-10-CM | POA: Insufficient documentation

## 2017-09-15 DIAGNOSIS — J181 Lobar pneumonia, unspecified organism: Secondary | ICD-10-CM | POA: Insufficient documentation

## 2017-09-15 DIAGNOSIS — F419 Anxiety disorder, unspecified: Secondary | ICD-10-CM | POA: Insufficient documentation

## 2017-09-15 NOTE — Assessment & Plan Note (Signed)
Stable mild intermittent asthma Cont flovent  And cont prn saba
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
## 2017-09-15 NOTE — Assessment & Plan Note (Signed)
RML PNA CAP Improved Plan  Finish levaquin Stay on Flovent twice daily Obtain a Chest xray in one week Return April 24 for recheck

## 2017-09-15 NOTE — Patient Instructions (Signed)
Finish levaquin Stay on Flovent twice daily Obtain a Chest xray in one week Return April 24 for recheck

## 2017-09-28 ENCOUNTER — Ambulatory Visit: Payer: Self-pay | Attending: Internal Medicine | Admitting: Internal Medicine

## 2017-09-28 ENCOUNTER — Other Ambulatory Visit: Payer: Self-pay

## 2017-09-28 ENCOUNTER — Encounter: Payer: Self-pay | Admitting: Internal Medicine

## 2017-09-28 VITALS — BP 140/94 | HR 77 | Temp 98.1°F | Resp 16 | Wt 236.6 lb

## 2017-09-28 DIAGNOSIS — I10 Essential (primary) hypertension: Secondary | ICD-10-CM

## 2017-09-28 DIAGNOSIS — Z09 Encounter for follow-up examination after completed treatment for conditions other than malignant neoplasm: Secondary | ICD-10-CM | POA: Insufficient documentation

## 2017-09-28 DIAGNOSIS — Z880 Allergy status to penicillin: Secondary | ICD-10-CM | POA: Insufficient documentation

## 2017-09-28 DIAGNOSIS — Z823 Family history of stroke: Secondary | ICD-10-CM | POA: Insufficient documentation

## 2017-09-28 DIAGNOSIS — Z87891 Personal history of nicotine dependence: Secondary | ICD-10-CM | POA: Insufficient documentation

## 2017-09-28 DIAGNOSIS — Z818 Family history of other mental and behavioral disorders: Secondary | ICD-10-CM | POA: Insufficient documentation

## 2017-09-28 DIAGNOSIS — Z8739 Personal history of other diseases of the musculoskeletal system and connective tissue: Secondary | ICD-10-CM

## 2017-09-28 DIAGNOSIS — Z79899 Other long term (current) drug therapy: Secondary | ICD-10-CM | POA: Insufficient documentation

## 2017-09-28 DIAGNOSIS — F411 Generalized anxiety disorder: Secondary | ICD-10-CM

## 2017-09-28 DIAGNOSIS — M2550 Pain in unspecified joint: Secondary | ICD-10-CM

## 2017-09-28 DIAGNOSIS — J4520 Mild intermittent asthma, uncomplicated: Secondary | ICD-10-CM | POA: Insufficient documentation

## 2017-09-28 DIAGNOSIS — Z9889 Other specified postprocedural states: Secondary | ICD-10-CM | POA: Insufficient documentation

## 2017-09-28 DIAGNOSIS — F1290 Cannabis use, unspecified, uncomplicated: Secondary | ICD-10-CM | POA: Insufficient documentation

## 2017-09-28 DIAGNOSIS — M109 Gout, unspecified: Secondary | ICD-10-CM | POA: Insufficient documentation

## 2017-09-28 DIAGNOSIS — Z8249 Family history of ischemic heart disease and other diseases of the circulatory system: Secondary | ICD-10-CM | POA: Insufficient documentation

## 2017-09-28 DIAGNOSIS — G8929 Other chronic pain: Secondary | ICD-10-CM | POA: Insufficient documentation

## 2017-09-28 DIAGNOSIS — K219 Gastro-esophageal reflux disease without esophagitis: Secondary | ICD-10-CM | POA: Insufficient documentation

## 2017-09-28 MED ORDER — AMLODIPINE BESYLATE 10 MG PO TABS: 10.0000 mg | ORAL_TABLET | Freq: Every day | ORAL | 3 refills | Status: DC

## 2017-09-28 MED ORDER — MELOXICAM 7.5 MG PO TABS: 7.5000 mg | ORAL_TABLET | Freq: Every day | ORAL | 2 refills | Status: DC

## 2017-09-28 NOTE — Patient Instructions (Addendum)
Your blood pressure is not controlled.  The goal is to be 130/80 or lower.  Chlorthalidone can increase episodes of gout.  Stop chlorthalidone.  Start taking amlodipine 10 mg daily instead.  Start meloxicam 7.5 mg daily to help with your joint symptoms.  I encourage regular exercise as this will help decrease joint pain.  Please get established with a psychiatrist for your anxiety.  I suggest that you do this before running out of your refills on Xanax.  I would also obtain the forms from the front desk today for the [facility] discount card or [facility] card.

## 2017-09-28 NOTE — Progress Notes (Signed)
Patient ID: Brandon Garza, male    DOB: [birth year]  MRN: [id number]  CC: Generalized Body Aches   Subjective: Brandon Garza is a 42 y.o. male who presents f/u visit and to est with me as PCP. His concerns today include:   Pt with hx of HTN, mild intermittent asthma, anxiety, former tob dep, subst abuse, stuttering, gout  Pt c/o pain in elbows, knees and feet x 6 mths. Intermittent. -swelling in feet and elbows sometimes, morning stiffness that last couple of hrs.  Better with moving around -no wgh changes, fever. Thinks his mother and maternal GM have rheumatoid arthritis. -gets gout flares in feet.  Last flare about 2 mths ago.  "I keep a pair of crutches behind my couch."  Dx about 10 yrs ago; never had fluid removed for microscopic eval. On Colchicine and Allopurinol for about 5 yrs.  Indocin works better for him when he has a flare.  HTN: on CLorthalidone x 1 yr.  Reports compliance. -limits salt in foods. -no CP?SOB with exertion. -quit smoking 5-6 yrs ago.   Soc: currently unemployed.  Worked at [employer] and [employer] in past.  Drinks beer and sometimes liquiror 2-3 x a mth Uses marijuana once and a while.  Reports using heroin one time for pain relief leading to admission in 06/2017  Anxiety:  On Xanax and Wellbutrin from previous PCP at [facility].  Had adverse SE from Paxil.  "I just did not feel like myself.  My mind was clouded."     Patient Active Problem List   Diagnosis Date Noted  . Former smoker 09/28/2017  . History of gout 09/28/2017  . Polyarthralgia 09/28/2017  . Community acquired pneumonia of right middle lobe of lung (HCC) 09/15/2017  . History of heroin use 07/09/2017  . Mild intermittent asthma with acute exacerbation 12/25/2016  . GERD (gastroesophageal reflux disease) 10/20/2012  . Essential hypertension 02/12/2012  . Stuttering 02/12/2012  . GAD (generalized anxiety disorder) 02/12/2012  . Nicotine use disorder 02/12/2012  . Chronic back pain  02/12/2012  . Hip dislocation, left (HCC) 02/12/2012  . Gout of multiple sites 08/04/2011     Current Outpatient Medications on File Prior to Visit  Medication Sig Dispense Refill  . acetaminophen (TYLENOL) 500 MG tablet Take 2 tablets (1,000 mg total) by mouth every 6 (six) hours as needed. (Patient taking differently: Take 1,000 mg by mouth every 6 (six) hours as needed for headache (pain). ) 30 tablet 0  . albuterol (PROVENTIL HFA;VENTOLIN HFA) 108 (90 Base) MCG/ACT inhaler Inhale 2 puffs into the lungs every 6 (six) hours as needed for wheezing or shortness of breath. 1 Inhaler 3  . allopurinol (ZYLOPRIM) 100 MG tablet Take 1 tablet (100 mg total) by mouth daily. 30 tablet 11  . ALPRAZolam (XANAX) 0.5 MG tablet Take 1 tablet (0.5 mg total) by mouth 3 (three) times daily as needed for anxiety. 90 tablet 5  . buPROPion (WELLBUTRIN XL) 150 MG 24 hr tablet Take 1 tablet (150 mg total) by mouth daily. 30 tablet 11  . colchicine 0.6 MG tablet 1.2 mg at the first sign of flare, followed in 1 hour with a single dose of 0.6 mg (maximum: 1.8 mg within 1 hour).  Repeat as needed. 12 tablet 3  . famotidine (PEPCID) 20 MG tablet Take 1 tablet (20 mg total) by mouth 2 (two) times daily. 60 tablet 6  . fluticasone (FLOVENT HFA) 110 MCG/ACT inhaler Inhale 2 puffs into the lungs 2 (two)  times daily. 1 Inhaler 12  . metoprolol succinate (TOPROL-XL) 100 MG 24 hr tablet TAKE 1 TABLET (100 MG TOTAL) BY MOUTH DAILY. TAKE WITH OR IMMEDIATELY FOLLOWING A MEAL. 90 tablet 3   No current facility-administered medications on file prior to visit.     Allergies  Allergen Reactions  . Amoxicillin Hives    Has patient had a PCN reaction causing immediate rash, facial/tongue/throat swelling, SOB or lightheadedness with hypotension: no Has patient had a PCN reaction causing severe rash involving mucus membranes or skin necrosis: no Has patient had a PCN reaction that required hospitalization: on Has patient had a PCN  reaction occurring within the last 10 years: no If all of the above answers are "NO", then may proceed with Cephalosporin use.     Social History   Socioeconomic History  . Marital status: Single    Spouse name: Not on file  . Number of children: Not on file  . Years of education: Not on file  . Highest education level: Not on file  Occupational History  . Not on file  Social Needs  . Financial resource strain: Not on file  . Food insecurity:    Worry: Not on file    Inability: Not on file  . Transportation needs:    Medical: Not on file    Non-medical: Not on file  Tobacco Use  . Smoking status: Former Smoker    Last attempt to quit: 07/22/2011    Years since quitting: 6.1  . Smokeless tobacco: Former Neurosurgeon    Types: Snuff, Dorna Bloom    Quit date: 07/22/2011  Substance and Sexual Activity  . Alcohol use: Yes    Alcohol/week: 3.0 - 6.0 oz    Types: 6 - 12 Standard drinks or equivalent per week    Comment: social  . Drug use: Yes    Types: Benzodiazepines, Cocaine, Marijuana  . Sexual activity: Yes    Birth control/protection: None  Lifestyle  . Physical activity:    Days per week: Not on file    Minutes per session: Not on file  . Stress: Not on file  Relationships  . Social connections:    Talks on phone: Not on file    Gets together: Not on file    Attends religious service: Not on file    Active member of club or organization: Not on file    Attends meetings of clubs or organizations: Not on file    Relationship status: Not on file  . Intimate partner violence:    Fear of current or ex partner: Not on file    Emotionally abused: Not on file    Physically abused: Not on file    Forced sexual activity: Not on file  Other Topics Concern  . Not on file  Social History Narrative  . Not on file    Family History  Problem Relation Age of Onset  . Heart Problems Father   . Stroke Maternal Grandfather   . Heart Problems Maternal Grandfather   . Mental illness  Paternal Grandmother   . Heart Problems Paternal Grandfather     Past Surgical History:  Procedure Laterality Date  . CORNEAL TRANSPLANT     left eye  . DOBUTAMINE STRESS ECHO  10/12/2008   normal  . left hip surgery    . NASAL SEPTUM SURGERY    . orthopedic left arm surgery     done following an accident  . TONSILLECTOMY      ROS:  Review of Systems Neg except as stated above  PHYSICAL EXAM: BP (!) 140/94 (BP Location: Left Arm, Patient Position: Sitting, Cuff Size: Large)   Pulse 77   Temp 98.1 F (36.7 C) (Oral)   Resp 16   Wt 236 lb 9.6 oz (107.3 kg)   SpO2 96%   BMI 33.95 kg/m   Wt Readings from Last 3 Encounters:  09/28/17 236 lb 9.6 oz (107.3 kg)  09/15/17 234 lb 3.2 oz (106.2 kg)  09/10/17 230 lb (104.3 kg)    Physical Exam  General appearance - alert, well appearing, and in no distress Mental status - alert, oriented to person, place, and time, normal mood, behavior, speech, dress, motor activity, and thought processes Chest - clear to auscultation, no wheezes, rales or rhonchi, symmetric air entry Heart - normal rate, regular rhythm, normal S1, S2, no murmurs, rubs, clicks or gallops Musculoskeletal -examination of the hands, wrists, elbows, knees and feet reveal no sign of active inflammation or swelling.  No deformities.  He has good range of motion in all these joints Extremities - peripheral pulses normal, no pedal edema, no clubbing or cyanosis   ASSESSMENT AND PLAN: 1. Polyarthralgia Doubt RA based on exam but given symptoms and fhx of RA, will check CCP and RA factor - Sedimentation Rate - Rheumatoid factor - CYCLIC CITRUL PEPTIDE ANTIBODY, IGG/IGA - VITAMIN D 25 Hydroxy (Vit-D Deficiency, Fractures) - meloxicam (MOBIC) 7.5 MG tablet; Take 1 tablet (7.5 mg total) by mouth daily.  Dispense: 30 tablet; Refill: 2  2. Essential hypertension Given history of gout, I recommend discontinuing chlorthalidone and putting him on Norvasc instead.  Continue  metoprolol - amLODipine (NORVASC) 10 MG tablet; Take 1 tablet (10 mg total) by mouth daily.  Dispense: 90 tablet; Refill: 3  3. History of gout   4. Former smoker Commended him on quitting  5. GAD (generalized anxiety disorder) -Patient has been on Xanax for a while however, I consider him high risk given his admission in January with heroin overdose and urine drug screen also positive for marijuana. -I informed patient that Xanax is not a medication that I prefer keeping people on long-term for anxiety.  Therefore, I recommend that he establish care with a mental health provider either at [facility] or [facility] services of the [facility].  I also advised that he obtain form from our front desk to apply for the [facility] discount.  That way if he does not like [facility] we can refer him [facility] one of the [facility].  He has 3 refills left on Xanax prescription that was given to him by Dr. Delford FieldWright.  I encouraged him to establish care prior to running out of these refills  Patient was given the opportunity to ask questions.  Patient verbalized understanding of the plan and was able to repeat key elements of the plan.   Orders Placed This Encounter  Procedures  . Sedimentation Rate  . Rheumatoid factor  . CYCLIC CITRUL PEPTIDE ANTIBODY, IGG/IGA  . VITAMIN D 25 Hydroxy (Vit-D Deficiency, Fractures)     Requested Prescriptions   Signed Prescriptions Disp Refills  . meloxicam (MOBIC) 7.5 MG tablet 30 tablet 2    Sig: Take 1 tablet (7.5 mg total) by mouth daily.  Marland Kitchen. amLODipine (NORVASC) 10 MG tablet 90 tablet 3    Sig: Take 1 tablet (10 mg total) by mouth daily.    Return in about 3 months (around 12/28/2017).  Jonah Blueeborah Johnson, MD, FACP

## 2017-09-28 NOTE — Progress Notes (Signed)
C/o joint pain and back aches.

## 2017-09-29 ENCOUNTER — Telehealth: Payer: Self-pay

## 2017-09-29 NOTE — Telephone Encounter (Signed)
Contacted pt to go over lab results pt is aware and doesn't have any questions or concerns 

## 2017-09-30 LAB — VITAMIN D 25 HYDROXY (VIT D DEFICIENCY, FRACTURES): VIT D 25 HYDROXY: 32.5 ng/mL (ref 30.0–100.0)

## 2017-09-30 LAB — RHEUMATOID FACTOR: RHEUMATOID FACTOR: 11.2 [IU]/mL (ref 0.0–13.9)

## 2017-09-30 LAB — CYCLIC CITRUL PEPTIDE ANTIBODY, IGG/IGA: CYCLIC CITRULLIN PEPTIDE AB: 4 U (ref 0–19)

## 2017-09-30 LAB — SEDIMENTATION RATE: Sed Rate: 19 mm/hr — ABNORMAL HIGH (ref 0–15)

## 2017-10-20 ENCOUNTER — Ambulatory Visit: Payer: Self-pay | Admitting: Critical Care Medicine

## 2017-11-18 ENCOUNTER — Encounter: Payer: Self-pay | Admitting: Family Medicine

## 2017-12-17 ENCOUNTER — Encounter (HOSPITAL_COMMUNITY): Payer: Self-pay | Admitting: Emergency Medicine

## 2017-12-17 ENCOUNTER — Other Ambulatory Visit: Payer: Self-pay

## 2017-12-17 ENCOUNTER — Emergency Department (HOSPITAL_COMMUNITY)
Admission: EM | Admit: 2017-12-17 | Discharge: 2017-12-17 | Disposition: A | Discharge status: Home / Self Care | Payer: Self-pay | Attending: Emergency Medicine | Admitting: Emergency Medicine

## 2017-12-17 ENCOUNTER — Emergency Department (HOSPITAL_COMMUNITY): Payer: Self-pay

## 2017-12-17 DIAGNOSIS — I10 Essential (primary) hypertension: Secondary | ICD-10-CM | POA: Insufficient documentation

## 2017-12-17 DIAGNOSIS — M2550 Pain in unspecified joint: Secondary | ICD-10-CM

## 2017-12-17 DIAGNOSIS — J4520 Mild intermittent asthma, uncomplicated: Secondary | ICD-10-CM | POA: Insufficient documentation

## 2017-12-17 DIAGNOSIS — Z87891 Personal history of nicotine dependence: Secondary | ICD-10-CM | POA: Insufficient documentation

## 2017-12-17 DIAGNOSIS — Z79899 Other long term (current) drug therapy: Secondary | ICD-10-CM | POA: Insufficient documentation

## 2017-12-17 DIAGNOSIS — M25532 Pain in left wrist: Secondary | ICD-10-CM | POA: Insufficient documentation

## 2017-12-17 MED ORDER — OMEPRAZOLE 20 MG PO CPDR: 20.0000 mg | DELAYED_RELEASE_CAPSULE | Freq: Every day | ORAL | 0 refills | Status: DC

## 2017-12-17 MED ORDER — MELOXICAM 7.5 MG PO TABS: 7.5000 mg | ORAL_TABLET | Freq: Every day | ORAL | 0 refills | Status: AC

## 2017-12-17 MED ORDER — OXYCODONE-ACETAMINOPHEN 5-325 MG PO TABS
1.0000 | ORAL_TABLET | Freq: Once | ORAL | Status: AC
Start: 2017-12-17 — End: 2017-12-17
  Administered 2017-12-17: 1 via ORAL
  Filled 2017-12-17: qty 1

## 2017-12-17 NOTE — ED Triage Notes (Addendum)
Pt c.o. 4-5 weeks of left wrist pain with limited ROM, no trauma. No fever.

## 2017-12-17 NOTE — ED Provider Notes (Signed)
Patient placed in Quick Look pathway, seen and evaluated   Chief Complaint: wrist pain  HPI:   5 day history of left wrist pain. No trauma. No fever, swelling, or redness. Pain is worse on volar aspect, limited ROM.  ROS: wrist pain  (one)  Physical Exam:   Gen: No distress  Neuro: Awake and Alert  Skin: Warm    Focused Exam: left wrist atraumatic - no warmth, swelling, redness- TTP of volar wrist - limited ROM   Initiation of care has begun. The patient has been counseled on the process, plan, and necessity for staying for the completion/evaluation, and the remainder of the medical screening examination    Rosalio LoudHedges, Lealand Elting, PA-C 12/17/17 1805    Eber HongMiller, Brian, MD 12/18/17 [phone]

## 2017-12-17 NOTE — Progress Notes (Signed)
Orthopedic Tech Progress Note Patient Details:  Brandon Garza 01/08/1976 [id number]  Ortho Devices Type of Ortho Device: Velcro wrist forearm splint Ortho Device/Splint Interventions: Application   Post Interventions Patient Tolerated: Well Instructions Provided: Care of device   Saul FordyceJennifer C Lysle Yero 12/17/2017, 9:45 PM

## 2017-12-17 NOTE — Discharge Instructions (Signed)
Please see the information and instructions below regarding your visit.  Your diagnoses today include:  1. Left wrist pain   2. Elevated blood pressure reading in office with diagnosis of hypertension   3. Polyarthralgia    Your x-ray is very reassuring today.  There can be many causes of wrist pain without injury including a pinched nerve in the wrist versus a inflamed tendon.  Tests performed today include: See side panel of your discharge paperwork for testing performed today. Vital signs are listed at the bottom of these instructions.   Medications prescribed:    Take any prescribed medications only as prescribed, and any over the counter medications only as directed on the packaging.  You are prescribed Mobic, a non-steroidal anti-inflammatory agent (NSAID) for pain. You may take 7.5mg  every 24 hours as needed for pain. If still requiring this medication around the clock for acute pain after 10 days, please see your primary healthcare provider.  Women who are pregnant, breastfeeding, or planning on becoming pregnant should not take non-steroidal anti-inflammatories such as Advil and Aleve. Tylenol is a safe over the counter pain reliever in pregnant women.  You may combine this medication with Tylenol, 650 mg every 6 hours, so you are receiving something for pain every 3 hours.  This is not a long-term medication unless under the care and direction of your primary provider. Taking this medication long-term and not under the supervision of a healthcare provider could increase the risk of stomach ulcers, kidney problems, and cardiovascular problems such as high blood pressure.   Your office will prescribed omeprazole, which can be taken every 24 hours.  This is to protect your stomach.  Home care instructions:  Please follow any educational materials contained in this packet.   Follow-up instructions: Please follow-up with your primary care provider in one week for further evaluation  of your symptoms if they are not completely improved.    Return instructions:  Please return to the Emergency Department if you experience worsening symptoms.  Please return to the emergency department for any fevers with joint pain, redness or swelling of the wrist. Please return if you have any other emergent concerns.  Additional Information:   Your vital signs today were: BP 127/79 (BP Location: Left Arm)    Pulse 78    Temp 98.8 F (37.1 C) (Oral)    Resp 16    Ht 5\' 10"  (1.778 m)    SpO2 96%    BMI 33.95 kg/m  If your blood pressure (BP) was elevated on multiple readings during this visit above 130 for the top number or above 80 for the bottom number, please have this repeated by your primary care provider within one month. --------------  Thank you for allowing us to participate in your care today.

## 2017-12-17 NOTE — ED Provider Notes (Signed)
[facility] EMERGENCY DEPARTMENT Provider Note   CSN: [id number] Arrival date & time: 12/17/17  1717     History   Chief Complaint Chief Complaint  Patient presents with  . Wrist Pain    left    HPI Brandon Garza is a 42 y.o. male.  HPI  Patient reporting a normal history of depression, anxiety, asthma, acid reflux, poly-arthralgias and gout presenting for left wrist pain.  Patient reports that he has some baseline left wrist pain all the time, as well as other arthralgias but he has had severe pain in the left wrist for the past 4 to 5 days.  Patient reports it feels slightly similar to prior gout episodes, which typically affect his right foot or elbow.  Patient denies any recent trauma No injury to the wrist.  Patient reports that he occasionally has pains shooting down from his neck, due to a "pinched nerve" in his neck, but this feels different.  Patient denies any erythema, swelling, or fevers or chills.  Patient reports that all fingers besides his left thumb occasionally feel "numb", but he feels the pain radiating into them at present.  Patient reports he has been taking Tylenol without full relief.  Past Medical History:  Diagnosis Date  . Acid reflux   . Allergy   . Anxiety   . Arthritis   . Asthma   . Bronchitis   . Depression with anxiety   . H/O degenerative disc disease   . 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 Hypertension   . Neuromuscular disorder [facility])     Patient Active Problem List   Diagnosis Date Noted  . Former smoker 09/28/2017  . History of gout 09/28/2017  . Polyarthralgia 09/28/2017  . Community acquired pneumonia of right middle lobe of lung (HCC) 09/15/2017  . History of heroin use 07/09/2017  . Mild intermittent asthma with acute exacerbation 12/25/2016  . GERD (gastroesophageal reflux disease) 10/20/2012  . Essential hypertension 02/12/2012  . Stuttering 02/12/2012  . GAD (generalized anxiety disorder)  02/12/2012  . Nicotine use disorder 02/12/2012  . Chronic back pain 02/12/2012  . Hip dislocation, left (HCC) 02/12/2012  . Gout of multiple sites 08/04/2011    Past Surgical History:  Procedure Laterality Date  . CORNEAL TRANSPLANT     left eye  . DOBUTAMINE STRESS ECHO  10/12/2008   normal  . left hip surgery    . NASAL SEPTUM SURGERY    . orthopedic left arm surgery     done following an accident  . TONSILLECTOMY          Home Medications    Prior to Admission medications   Medication Sig Start Date End Date Taking? Authorizing Provider  acetaminophen (TYLENOL) 500 MG tablet Take 2 tablets (1,000 mg total) by mouth every 6 (six) hours as needed. Patient taking differently: Take 1,000 mg by mouth every 6 (six) hours as needed for headache (pain).  08/14/16   Arby Barrette, MD  albuterol (PROVENTIL HFA;VENTOLIN HFA) 108 (90 Base) MCG/ACT inhaler Inhale 2 puffs into the lungs every 6 (six) hours as needed for wheezing or shortness of breath. 08/25/17   Storm Frisk, MD  allopurinol (ZYLOPRIM) 100 MG tablet Take 1 tablet (100 mg total) by mouth daily. 08/25/17   Storm Frisk, MD  ALPRAZolam Prudy Feeler) 0.5 MG tablet Take 1 tablet (0.5 mg total) by mouth 3 (three) times daily as needed for anxiety. 08/25/17  Storm Frisk, MD  amLODipine (NORVASC) 10 MG tablet Take 1 tablet (10 mg total) by mouth daily. 09/28/17   Marcine Matar, MD  buPROPion (WELLBUTRIN XL) 150 MG 24 hr tablet Take 1 tablet (150 mg total) by mouth daily. 08/25/17   Storm Frisk, MD  colchicine 0.6 MG tablet 1.2 mg at the first sign of flare, followed in 1 hour with a single dose of 0.6 mg (maximum: 1.8 mg within 1 hour).  Repeat as needed. 08/25/17   Storm Frisk, MD  famotidine (PEPCID) 20 MG tablet Take 1 tablet (20 mg total) by mouth 2 (two) times daily. 08/25/17   Storm Frisk, MD  fluticasone (FLOVENT HFA) 110 MCG/ACT inhaler Inhale 2 puffs into the lungs 2 (two) times daily. 08/25/17    Storm Frisk, MD  meloxicam (MOBIC) 7.5 MG tablet Take 1 tablet (7.5 mg total) by mouth daily. 09/28/17   Marcine Matar, MD  metoprolol succinate (TOPROL-XL) 100 MG 24 hr tablet TAKE 1 TABLET (100 MG TOTAL) BY MOUTH DAILY. TAKE WITH OR IMMEDIATELY FOLLOWING A MEAL. 08/25/17   Storm Frisk, MD    Family History Family History  Problem Relation Age of Onset  . Heart Problems Father   . Stroke Maternal Grandfather   . Heart Problems Maternal Grandfather   . Mental illness Paternal Grandmother   . Heart Problems Paternal Grandfather     Social History Social History   Tobacco Use  . Smoking status: Former Smoker    Last attempt to quit: 07/22/2011    Years since quitting: 6.4  . Smokeless tobacco: Former User    Types: Snuff, Dorna Bloom    Quit date: 07/22/2011  Substance Use Topics  . Alcohol use: Yes    Alcohol/week: 3.6 - 7.2 oz    Types: 6 - 12 Standard drinks or equivalent per week    Comment: social  . Drug use: Yes    Types: Benzodiazepines, Cocaine, Marijuana     Allergies   Amoxicillin   Review of Systems Review of Systems  Constitutional: Negative for chills and fever.  Musculoskeletal: Positive for arthralgias. Negative for joint swelling.  Neurological: Negative for weakness and numbness.     Physical Exam Updated Vital Signs BP (!) 151/86 (BP Location: Right Arm)   Pulse (!) 102   Temp 98.8 F (37.1 C) (Oral)   Resp 20   Ht 5\' 10"  (1.778 m)   SpO2 97%   BMI 33.95 kg/m   Physical Exam  Constitutional: He appears well-developed and well-nourished. No distress.  Sitting comfortably in bed.  HENT:  Head: Normocephalic and atraumatic.  Eyes: Conjunctivae are normal. Right eye exhibits no discharge. Left eye exhibits no discharge.  EOMs normal to gross examination.  Neck: Normal range of motion.  Cardiovascular: Normal rate and regular rhythm.  Intact, 2+ radial and ulnar pulse of LUE.  Pulmonary/Chest:  Normal respiratory effort. Patient  converses comfortably. No audible wheeze or stridor.  Abdominal: He exhibits no distension.  Musculoskeletal: Normal range of motion.  No erythema, edema, or bony deformity of the left wrist.  Left wrist with tenderness to palpation over the distal ulna.  Pain radiates up into the fifth meta carpal.  Patient has full range of motion with flexion, extension, inversion, and eversion of the left wrist.  No point tenderness.  Patient has full sensation distal in all 5 digits of the left upper extremity.  Capillary refill is less than 2 seconds.  Neurological: He  is alert.  Cranial nerves intact to gross observation. Patient moves extremities without difficulty.  Skin: Skin is warm and dry. He is not diaphoretic.  Psychiatric: He has a normal mood and affect. His behavior is normal. Judgment and thought content normal.  Nursing note and vitals reviewed.    ED Treatments / Results  Labs (all labs ordered are listed, but only abnormal results are displayed) Labs Reviewed - No data to display  EKG None  Radiology Dg Wrist Complete Left  Result Date: 12/17/2017 CLINICAL DATA:  Acute left wrist pain without known injury. EXAM: LEFT WRIST - COMPLETE 3+ VIEW COMPARISON:  Radiographs of November 29, 2012. FINDINGS: There is no evidence of fracture or dislocation. There is no evidence of arthropathy or other focal bone abnormality. Soft tissues are unremarkable. IMPRESSION: Normal left wrist. Electronically Signed   By: Lupita RaiderJames  Green Jr, M.D.   On: 12/17/2017 19:28    Procedures Procedures (including critical care time)  Medications Ordered in ED Medications  oxyCODONE-acetaminophen (PERCOCET/ROXICET) 5-325 MG per tablet 1 tablet (1 tablet Oral Given 12/17/17 1820)     Initial Impression / Assessment and Plan / ED Course  I have reviewed the triage vital signs and the nursing notes.  Pertinent labs & imaging results that were available during my care of the patient were reviewed by me and  considered in my medical decision making (see chart for details).     Patient nontoxic-appearing, afebrile, and neurovascularly intact in the left upper extremity.  Patient has atraumatic wrist pain, therefore doubt occult fracture, and patient also denies any injuries of the left upper extremity since 2005.  Therefore, doubt occult scaphoid or other carpal bone fracture.  Differential diagnosis includes tendinitis, inflammation of ulnar nerve through Guyon's canal, cervical radiculopathy.  Do not feel that this is a gout, as this is an atypical joint for gout, and the joint is not erythematous or edematous.  Additionally, doubt septic arthritis, as there is no erythema, edema, or fever.  Pulse was rechecked by me, and patient is not tachycardic.  We will treat with Mobic x1 week, with PPI for stomach protection, and will have patient follow-up closely with primary care provider, while mobilizing wrist and wrist splint.  Discussed with patient that further imaging could be required at some point if he continues to have atraumatic wrist pain.  Patient is in understanding and agrees with the plan of care.  Final Clinical Impressions(s) / ED Diagnoses   Final diagnoses:  Left wrist pain  Elevated blood pressure reading in office with diagnosis of hypertension    ED Discharge Orders        Ordered    meloxicam (MOBIC) 7.5 MG tablet  Daily     12/17/17 2134    omeprazole (PRILOSEC) 20 MG capsule  Daily     12/17/17 2134       Delia ChimesMurray, Alyssa B, PA-C 12/17/17 2136    Rolland PorterJames, Mark, MD 12/18/17 [phone]

## 2017-12-31 ENCOUNTER — Ambulatory Visit: Payer: Self-pay | Admitting: Internal Medicine

## 2018-02-17 ENCOUNTER — Ambulatory Visit: Payer: Self-pay | Attending: Internal Medicine | Admitting: Physician Assistant

## 2018-02-17 ENCOUNTER — Ambulatory Visit: Payer: Self-pay | Attending: Family Medicine | Admitting: Licensed Clinical Social Worker

## 2018-02-17 VITALS — BP 141/75 | HR 67 | Temp 99.9°F | Resp 18 | Ht 70.0 in | Wt 240.0 lb

## 2018-02-17 DIAGNOSIS — Z880 Allergy status to penicillin: Secondary | ICD-10-CM | POA: Insufficient documentation

## 2018-02-17 DIAGNOSIS — F411 Generalized anxiety disorder: Secondary | ICD-10-CM | POA: Insufficient documentation

## 2018-02-17 DIAGNOSIS — Z79899 Other long term (current) drug therapy: Secondary | ICD-10-CM | POA: Insufficient documentation

## 2018-02-17 DIAGNOSIS — Z23 Encounter for immunization: Secondary | ICD-10-CM

## 2018-02-17 DIAGNOSIS — I10 Essential (primary) hypertension: Secondary | ICD-10-CM | POA: Insufficient documentation

## 2018-02-17 DIAGNOSIS — F8081 Childhood onset fluency disorder: Secondary | ICD-10-CM

## 2018-02-17 DIAGNOSIS — F418 Other specified anxiety disorders: Secondary | ICD-10-CM | POA: Insufficient documentation

## 2018-02-17 DIAGNOSIS — K219 Gastro-esophageal reflux disease without esophagitis: Secondary | ICD-10-CM | POA: Insufficient documentation

## 2018-02-17 MED ORDER — ALPRAZOLAM 0.5 MG PO TABS: ORAL_TABLET | ORAL | 0 refills | Status: AC

## 2018-02-17 MED FILL — !VENTOLIN HFA INHALER: 108 (90 BAS | 25 days supply | Qty: 18 | Fill #0

## 2018-02-17 NOTE — Patient Instructions (Signed)
Establ[facility] with [facility]

## 2018-02-17 NOTE — Progress Notes (Signed)
Patient ID: Brandon Garza, male   DOB: 12/15/1975, 42 y.o.   MRN: [id number]   Brandon Garza, is a 42 y.o. male  YNW:[id number]  MVH:[id number]  DOB - 08/08/1975  Subjective:  Chief Complaint and HPI: Brandon Garza is a 42 y.o. male here today for RF on Xanax.  H/o anxiety.  I spoke with Dr Laural BenesJohnson, and she had told the patient at his last visit that we could not prescribe this in the future and that he would need to [facility].  This was in April 2019.  He is on his last RF that Dr Delford FieldWright had given him.    He says he drinks 2 alcohol drinks per week.  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  He denies current use of any illicits.  Brandon Garza database reviewed.     ROS:   Constitutional:  No f/c, No night sweats, No unexplained weight loss. EENT:  No vision changes, No blurry vision, No hearing changes. No mouth, throat, or ear problems.  Respiratory: No cough, No SOB Cardiac: No CP, no palpitations GI:  No abd pain, No N/V/D. GU: No Urinary s/sx Musculoskeletal: No joint pain Neuro: No headache, no dizziness, no motor weakness.  Skin: No rash Endocrine:  No polydipsia. No polyuria.  Psych: Denies SI/HI  No problems updated.  ALLERGIES: Allergies  Allergen Reactions  . Amoxicillin Hives    Has patient had a PCN reaction causing immediate rash, facial/tongue/throat swelling, SOB or lightheadedness with hypotension: no Has patient had a PCN reaction causing severe rash involving mucus membranes or skin necrosis: no Has patient had a PCN reaction that required hospitalization: on Has patient had a PCN reaction occurring within the last 10 years: no If all of the above answers are "NO", then may proceed with Cephalosporin use.     PAST MEDICAL HISTORY: Past Medical History:  Diagnosis Date  . Acid reflux   . Allergy   . Anxiety   . Arthritis   .  Asthma   . Bronchitis   . Depression with anxiety   . H/O degenerative disc disease   . 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 Hypertension   . Neuromuscular disorder (HCC)     MEDICATIONS AT HOME: Prior to Admission medications   Medication Sig Start Date End Date Taking? Authorizing Provider  acetaminophen (TYLENOL) 500 MG tablet Take 2 tablets (1,000 mg total) by mouth every 6 (six) hours as needed. Patient taking differently: Take 1,000 mg by mouth every 6 (six) hours as needed for headache (pain).  08/14/16  Yes Arby BarrettePfeiffer, Marcy, MD  albuterol (PROVENTIL HFA;VENTOLIN HFA) 108 (90 Base) MCG/ACT inhaler Inhale 2 puffs into the lungs every 6 (six) hours as needed for wheezing or shortness of breath. 08/25/17  Yes Storm FriskWright, Patrick E, MD  allopurinol (ZYLOPRIM) 100 MG tablet Take 1 tablet (100 mg total) by mouth daily. 08/25/17  Yes Storm FriskWright, Patrick E, MD  ALPRAZolam Prudy Feeler(XANAX) 0.5 MG tablet 1 tablet 3 times daily for 1 week 1 tablet 2 times daily for 1 week 1 tablet 1 time daily for 1 week 1/2 tablet 1 time daily for 1 week 02/17/18  Yes Sherissa Tenenbaum M, PA-C  amLODipine (NORVASC) 10 MG tablet Take 1 tablet (10 mg total) by mouth daily. 09/28/17  Yes Marcine MatarJohnson, Deborah B, MD  buPROPion (  WELLBUTRIN XL) 150 MG 24 hr tablet Take 1 tablet (150 mg total) by mouth daily. 08/25/17  Yes Storm Frisk, MD  colchicine 0.6 MG tablet 1.2 mg at the first sign of flare, followed in 1 hour with a single dose of 0.6 mg (maximum: 1.8 mg within 1 hour).  Repeat as needed. 08/25/17  Yes Storm Frisk, MD  famotidine (PEPCID) 20 MG tablet Take 1 tablet (20 mg total) by mouth 2 (two) times daily. 08/25/17  Yes Storm Frisk, MD  fluticasone (FLOVENT HFA) 110 MCG/ACT inhaler Inhale 2 puffs into the lungs 2 (two) times daily. 08/25/17  Yes Storm Frisk, MD  metoprolol succinate (TOPROL-XL) 100 MG 24 hr tablet TAKE 1 TABLET (100 MG TOTAL) BY MOUTH DAILY. TAKE WITH OR IMMEDIATELY FOLLOWING A MEAL.  08/25/17  Yes Storm Frisk, MD  omeprazole (PRILOSEC) 20 MG capsule Take 1 capsule (20 mg total) by mouth daily for 14 days. 12/17/17 12/31/17  Aviva Kluver B, PA-C     Objective:  EXAM:   Vitals:   02/17/18 1401  BP: (!) 141/75  Pulse: 67  Resp: 18  Temp: 99.9 F (37.7 C)  TempSrc: Oral  SpO2: 98%  Weight: 240 lb (108.9 kg)  Height: 5\' 10"  (1.778 m)    General appearance : A&OX3. NAD. Non-toxic-appearing HEENT: Atraumatic and Normocephalic.  PERRLA. EOM intact.   Neck: supple, no JVD. No cervical lymphadenopathy. No thyromegaly Chest/Lungs:  Breathing-non-labored, Good air entry bilaterally, breath sounds normal without rales, rhonchi, or wheezing  CVS: S1 S2 regular, no murmurs, gallops, rubs  Extremities: Bilateral Lower Ext shows no edema, both legs are warm to touch with = pulse throughout Neurology:  CN II-XII grossly intact, Non focal.   Psych:  TP linear. J/I WNL. stuttering speech. Appropriate eye contact and affect.  Skin:  No Rash  Data Review No results found for: HGBA1C   Assessment & Plan   1. GAD (generalized anxiety disorder) -reviewed with him again our policy on benzos and that Dr Laural Benes had advised him to go to [facility] several months ago.  I will provide him with a taper so he can safely withdraw from Xanax and a better solution to his anxiety can be decided at [facility].  I also had him meet with Jenel Lucks today to explain how the [facility] clinic works and to discuss anxiety management. I stressed the importance of not using other drugs, including alcohol while on benzos.  Patient expresses understanding.  [place] database reviewed and discussed with Dr Laural Benes - ALPRAZolam Prudy Feeler) 0.5 MG tablet; 1 tablet 3 times daily for 1 week 1 tablet 2 times daily for 1 week 1 tablet 1 time daily for 1 week 1/2 tablet 1 time daily for 1 week  Dispense: 46 tablet; Refill: 0  2. Stuttering - ALPRAZolam (XANAX) 0.5 MG tablet; 1 tablet 3 times daily for 1 week 1  tablet 2 times daily for 1 week 1 tablet 1 time daily for 1 week 1/2 tablet 1 time daily for 1 week  Dispense: 46 tablet; Refill: 0  3. Need for immunization against influenza - Flu Vaccine QUAD 36+ mos IM  Counseled at length.  Spent face to face discussing benzo policy, patient education, substance abuse education, and stress management techniques.     Patient have been counseled extensively about nutrition and exercise  No follow-ups on file.  The patient was given clear instructions to go to ER or return to medical center if symptoms don't  improve, worsen or new problems develop. The patient verbalized understanding. The patient was told to call to get lab results if they haven't heard anything in the next week.     Georgian Co, PA-C [facility] [place], [place] [phone]   02/17/2018, 2:24 PM

## 2018-02-18 NOTE — BH Specialist Note (Signed)
Type of Service: Integrated Behavioral Health  Estimate Time:15 minutes Interpreter:No.   Brandon Garza is a 42 year old male referred by Brandon Garza for symptoms of  Anxiety.  Patient was accompanied by self. Reports : ongoing feelings of worry and need to re-initiate medication management. Duration of problem: Ongoing   Impact: pt states that his anxiety has negatively impacted functioning Mental health / substance use: Pt has hx of medication management and is in need of a refill for medications (Xanax) Appearance:Well Groomed ; Thought process: Coherent; Affect: Appropriate : No plan to harm self or others  LIFE /SOCIAL :  No concerns noted regarding support system    Recent Life changes: Pt states that he needs psychiatry referral  GOALS ADDRESSED:  Patient will: 1. Reduce symptoms of: anxiety 2. Increase knowledge and/or ability of: coping skills  3. Demonstrate ability to: Increase adequate support systems for patient/family   INTERVENTION:  Solution-Focused Strategies, Reflective listening, Behavioral Therapy (Relaxed breathing), Community Resource   GAD-7 and PHQ 2&9=6 ,indication of : mild depression.  GAD-7=7,indication of : moderate anxiety.   ISSUES DISCUSSED: Integrated care services provided at clinic were discussed.   ASSESSMENT:Patient currently experiencing symptoms of anxiety and depression.  Symptoms exacerbated by substance use. Patient may benefit from, and is in agreement to receive further assessment and brief therapeutic interventions to assist with managing anxiety, depression, and substance use. LCSWA provided supportive resources to assist pt with medication management and psychotherapy   PLAN:   .Patient will F/U with LCSW Pt was encouraged to contact LCSWA if symptoms worsen or fail to improve to [facility]. Behavioral recommendations: LCSWA recommends that pt apply healthy coping skills discussed and utilize provided  resources. Pt is encouraged to schedule follow up appointment with LCSWA . Referral:Community Mental Health Services (LME/Outside Clinic),    Brandon Garza, [place] 02/18/18 4:57 PM

## 2018-04-19 ENCOUNTER — Ambulatory Visit: Payer: Self-pay | Admitting: Internal Medicine

## 2018-05-02 ENCOUNTER — Encounter (HOSPITAL_COMMUNITY): Payer: Self-pay | Admitting: *Deleted

## 2018-05-02 ENCOUNTER — Other Ambulatory Visit: Payer: Self-pay

## 2018-05-02 ENCOUNTER — Emergency Department (HOSPITAL_COMMUNITY)
Admission: EM | Admit: 2018-05-02 | Discharge: 2018-05-02 | Disposition: A | Discharge status: Home / Self Care | Payer: Self-pay | Attending: Emergency Medicine | Admitting: Emergency Medicine

## 2018-05-02 DIAGNOSIS — Z79899 Other long term (current) drug therapy: Secondary | ICD-10-CM | POA: Insufficient documentation

## 2018-05-02 DIAGNOSIS — J45909 Unspecified asthma, uncomplicated: Secondary | ICD-10-CM | POA: Insufficient documentation

## 2018-05-02 DIAGNOSIS — B9789 Other viral agents as the cause of diseases classified elsewhere: Secondary | ICD-10-CM

## 2018-05-02 DIAGNOSIS — Z87891 Personal history of nicotine dependence: Secondary | ICD-10-CM | POA: Insufficient documentation

## 2018-05-02 DIAGNOSIS — J069 Acute upper respiratory infection, unspecified: Secondary | ICD-10-CM | POA: Insufficient documentation

## 2018-05-02 DIAGNOSIS — I10 Essential (primary) hypertension: Secondary | ICD-10-CM | POA: Insufficient documentation

## 2018-05-02 DIAGNOSIS — R590 Localized enlarged lymph nodes: Secondary | ICD-10-CM | POA: Insufficient documentation

## 2018-05-02 MED ORDER — BENZONATATE 100 MG PO CAPS: 200.0000 mg | ORAL_CAPSULE | Freq: Three times a day (TID) | ORAL | 0 refills | Status: DC

## 2018-05-02 MED ORDER — FLUTICASONE PROPIONATE 50 MCG/ACT NA SUSP: 1.0000 | Freq: Every day | NASAL | 2 refills | Status: DC

## 2018-05-02 NOTE — ED Notes (Signed)
ED Provider at bedside. 

## 2018-05-02 NOTE — ED Notes (Signed)
Pt verbalized understanding of discharge instructions and denies any further questions at this time.   

## 2018-05-02 NOTE — ED Triage Notes (Signed)
Pt in c/o pain to the right side of his neck where a bump appeared yesterday, no redness noted, no distress noted

## 2018-05-02 NOTE — Discharge Instructions (Signed)
You will need to have the lymph node evaluated if it persist after your cold symptoms improved. Take Tessalon Perles and use Flonase as directed. Return to ED for chest pain, shortness of breath, trouble swallowing, coughing up blood.

## 2018-05-02 NOTE — ED Provider Notes (Addendum)
[facility] EMERGENCY DEPARTMENT Provider Note   CSN: [id number] Arrival date & time: 05/02/18  1240     History   Chief Complaint Chief Complaint  Patient presents with  . Neck Pain    HPI Brandon Garza is a 42 y.o. male with a past medical history of GERD, hypertension, asthma who presents to ED for evaluation of lump on the right side of the neck that he noticed about 24 hours ago.  He reports 1 week history of URI symptoms including dry persistent cough, nasal congestion.  No history of similar symptoms in the past.  Reports some throat discomfort and postnasal drainage.  Denies any fevers, sick contacts, injuries or falls, rashes.  HPI  Past Medical History:  Diagnosis Date  . Acid reflux   . Allergy   . Anxiety   . Arthritis   . Asthma   . Bronchitis   . Depression with anxiety   . H/O degenerative disc disease   . 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 Hypertension   . Neuromuscular disorder [facility])     Patient Active Problem List   Diagnosis Date Noted  . Former smoker 09/28/2017  . History of gout 09/28/2017  . Polyarthralgia 09/28/2017  . Community acquired pneumonia of right middle lobe of lung (HCC) 09/15/2017  . History of heroin use 07/09/2017  . Mild intermittent asthma with acute exacerbation 12/25/2016  . GERD (gastroesophageal reflux disease) 10/20/2012  . Essential hypertension 02/12/2012  . Stuttering 02/12/2012  . GAD (generalized anxiety disorder) 02/12/2012  . Nicotine use disorder 02/12/2012  . Chronic back pain 02/12/2012  . Hip dislocation, left (HCC) 02/12/2012  . Gout of multiple sites 08/04/2011    Past Surgical History:  Procedure Laterality Date  . CORNEAL TRANSPLANT     left eye  . DOBUTAMINE STRESS ECHO  10/12/2008   normal  . left hip surgery    . NASAL SEPTUM SURGERY    . orthopedic left arm surgery     done following an accident  . TONSILLECTOMY          Home Medications    Prior  to Admission medications   Medication Sig Start Date End Date Taking? Authorizing Provider  acetaminophen (TYLENOL) 500 MG tablet Take 2 tablets (1,000 mg total) by mouth every 6 (six) hours as needed. Patient taking differently: Take 1,000 mg by mouth every 6 (six) hours as needed for headache (pain).  08/14/16   Arby Barrette, MD  albuterol (PROVENTIL HFA;VENTOLIN HFA) 108 (90 Base) MCG/ACT inhaler Inhale 2 puffs into the lungs every 6 (six) hours as needed for wheezing or shortness of breath. 08/25/17   Storm Frisk, MD  allopurinol (ZYLOPRIM) 100 MG tablet Take 1 tablet (100 mg total) by mouth daily. 08/25/17   Storm Frisk, MD  ALPRAZolam Prudy Feeler) 0.5 MG tablet 1 tablet 3 times daily for 1 week 1 tablet 2 times daily for 1 week 1 tablet 1 time daily for 1 week 1/2 tablet 1 time daily for 1 week 02/17/18   Anders Simmonds, PA-C  amLODipine (NORVASC) 10 MG tablet Take 1 tablet (10 mg total) by mouth daily. 09/28/17   Marcine Matar, MD  benzonatate (TESSALON) 100 MG capsule Take 2 capsules (200 mg total) by mouth every 8 (eight) hours. 05/02/18   Shakiara Lukic, PA-C  buPROPion (WELLBUTRIN XL) 150 MG 24 hr tablet Take 1 tablet (150 mg total) by mouth daily. 08/25/17  Storm Frisk, MD  colchicine 0.6 MG tablet 1.2 mg at the first sign of flare, followed in 1 hour with a single dose of 0.6 mg (maximum: 1.8 mg within 1 hour).  Repeat as needed. 08/25/17   Storm Frisk, MD  famotidine (PEPCID) 20 MG tablet Take 1 tablet (20 mg total) by mouth 2 (two) times daily. 08/25/17   Storm Frisk, MD  fluticasone (FLONASE) 50 MCG/ACT nasal spray Place 1 spray into both nostrils daily. 05/02/18   Makyna Niehoff, PA-C  fluticasone (FLOVENT HFA) 110 MCG/ACT inhaler Inhale 2 puffs into the lungs 2 (two) times daily. 08/25/17   Storm Frisk, MD  metoprolol succinate (TOPROL-XL) 100 MG 24 hr tablet TAKE 1 TABLET (100 MG TOTAL) BY MOUTH DAILY. TAKE WITH OR IMMEDIATELY FOLLOWING A MEAL. 08/25/17    Storm Frisk, MD  omeprazole (PRILOSEC) 20 MG capsule Take 1 capsule (20 mg total) by mouth daily for 14 days. 12/17/17 12/31/17  Elisha Ponder, PA-C    Family History Family History  Problem Relation Age of Onset  . Heart Problems Father   . Stroke Maternal Grandfather   . Heart Problems Maternal Grandfather   . Mental illness Paternal Grandmother   . Heart Problems Paternal Grandfather     Social History Social History   Tobacco Use  . Smoking status: Former Smoker    Last attempt to quit: 07/22/2011    Years since quitting: 6.7  . Smokeless tobacco: Former User    Types: Snuff, Dorna Bloom    Quit date: 07/22/2011  Substance Use Topics  . Alcohol use: Yes    Alcohol/week: 6.0 - 12.0 standard drinks    Types: 6 - 12 Standard drinks or equivalent per week    Comment: social  . Drug use: Yes    Types: Benzodiazepines, Cocaine, Marijuana     Allergies   Amoxicillin   Review of Systems Review of Systems  Constitutional: Negative for chills and fever.  HENT: Positive for congestion and sore throat.   Respiratory: Positive for cough.      Physical Exam Updated Vital Signs BP 126/69 (BP Location: Right Arm)   Pulse (!) 57   Temp 98 F (36.7 C) (Oral)   Resp 20   SpO2 100%   Physical Exam  Constitutional: He appears well-developed and well-nourished. No distress.  HENT:  Head: Normocephalic and atraumatic.  Nose: Rhinorrhea present.  Mouth/Throat: Uvula is midline and oropharynx is clear and moist. Tonsils are 0 on the right. Tonsils are 0 on the left.  Eyes: Conjunctivae and EOM are normal. No scleral icterus.  Neck: Normal range of motion.  No meningismus.  Mobile, slightly tender to palpation lymph node noted on the right side of the neck.  No overlying skin changes noted.  Cardiovascular: Normal rate, regular rhythm and normal heart sounds.  Pulmonary/Chest: Effort normal and breath sounds normal. No respiratory distress.  Lymphadenopathy:    He has  cervical adenopathy.  Neurological: He is alert.  Skin: No rash noted. He is not diaphoretic.  Psychiatric: He has a normal mood and affect.  Nursing note and vitals reviewed.    ED Treatments / Results  Labs (all labs ordered are listed, but only abnormal results are displayed) Labs Reviewed - No data to display  EKG None  Radiology No results found.  Procedures Procedures (including critical care time)  Medications Ordered in ED Medications - No data to display   Initial Impression / Assessment and Plan /  ED Course  I have reviewed the triage vital signs and the nursing notes.  Pertinent labs & imaging results that were available during my care of the patient were reviewed by me and considered in my medical decision making (see chart for details).     42 year old male presents to ED for lump on the right side of the neck that he noticed yesterday.  He has had URI symptoms for the past week.  Denies any sore throat.  On exam no signs of RPA or PTA noted on examination.  Tonsils are surgically absent.  He does have some postnasal drainage seen.  Findings consistent with cervical adenopathy noted on the right side of the neck.  Suspect that his symptoms are reactive from his URI.  Patient counseled on appropriate time to follow-up for evaluation of lymph node if it persists after his cold symptoms.  Doubt meningitis, or other neurological cause of symptoms.  Will treat with antitussives and Flonase and advised him to follow-up with PCP for further evaluation and to return to ED for any severe worsening symptoms.  Patient is hemodynamically stable, in NAD, and able to ambulate in the ED. Evaluation does not show pathology that would require ongoing emergent intervention or inpatient treatment. I explained the diagnosis to the patient. Pain has been managed and has no complaints prior to discharge. Patient is comfortable with above plan and is stable for discharge at this time. All  questions were answered prior to disposition. Strict return precautions for returning to the ED were discussed. Encouraged follow up with PCP.    Portions of this note were generated with Scientist, clinical (histocompatibility and immunogenetics). Dictation errors may occur despite best attempts at proofreading.   Final Clinical Impressions(s) / ED Diagnoses   Final diagnoses:  Cervical lymphadenopathy  Viral URI with cough    ED Discharge Orders         Ordered    benzonatate (TESSALON) 100 MG capsule  Every 8 hours     05/02/18 1321    fluticasone (FLONASE) 50 MCG/ACT nasal spray  Daily     05/02/18 1321             Dietrich Pates, PA-C 05/02/18 1325    Melene Plan, DO 05/02/18 1600

## 2018-05-09 ENCOUNTER — Emergency Department (HOSPITAL_COMMUNITY): Payer: Self-pay

## 2018-05-09 ENCOUNTER — Other Ambulatory Visit: Payer: Self-pay

## 2018-05-09 ENCOUNTER — Encounter (HOSPITAL_COMMUNITY): Payer: Self-pay

## 2018-05-09 ENCOUNTER — Emergency Department (HOSPITAL_COMMUNITY)
Admission: EM | Admit: 2018-05-09 | Discharge: 2018-05-09 | Disposition: A | Discharge status: Home / Self Care | Payer: Self-pay | Attending: Emergency Medicine | Admitting: Emergency Medicine

## 2018-05-09 DIAGNOSIS — F419 Anxiety disorder, unspecified: Secondary | ICD-10-CM | POA: Insufficient documentation

## 2018-05-09 DIAGNOSIS — F329 Major depressive disorder, single episode, unspecified: Secondary | ICD-10-CM | POA: Insufficient documentation

## 2018-05-09 DIAGNOSIS — M79632 Pain in left forearm: Secondary | ICD-10-CM | POA: Insufficient documentation

## 2018-05-09 DIAGNOSIS — Z79899 Other long term (current) drug therapy: Secondary | ICD-10-CM | POA: Insufficient documentation

## 2018-05-09 DIAGNOSIS — J45909 Unspecified asthma, uncomplicated: Secondary | ICD-10-CM | POA: Insufficient documentation

## 2018-05-09 DIAGNOSIS — Z947 Corneal transplant status: Secondary | ICD-10-CM | POA: Insufficient documentation

## 2018-05-09 DIAGNOSIS — I10 Essential (primary) hypertension: Secondary | ICD-10-CM | POA: Insufficient documentation

## 2018-05-09 DIAGNOSIS — Z87891 Personal history of nicotine dependence: Secondary | ICD-10-CM | POA: Insufficient documentation

## 2018-05-09 DIAGNOSIS — M79602 Pain in left arm: Secondary | ICD-10-CM

## 2018-05-09 MED ORDER — IBUPROFEN 800 MG PO TABS
800.0000 mg | ORAL_TABLET | Freq: Once | ORAL | Status: AC
  Administered 2018-05-09: 800 mg via ORAL
  Filled 2018-05-09: qty 1

## 2018-05-09 MED ORDER — TRAMADOL HCL 50 MG PO TABS: 50.0000 mg | ORAL_TABLET | Freq: Four times a day (QID) | ORAL | 0 refills | Status: DC | PRN

## 2018-05-09 MED ORDER — HYDROCODONE-ACETAMINOPHEN 5-325 MG PO TABS
1.0000 | ORAL_TABLET | Freq: Once | ORAL | Status: AC
  Administered 2018-05-09: 1 via ORAL
  Filled 2018-05-09: qty 1

## 2018-05-09 MED ORDER — IBUPROFEN 800 MG PO TABS: 800.0000 mg | ORAL_TABLET | Freq: Four times a day (QID) | ORAL | 0 refills | Status: AC | PRN

## 2018-05-09 NOTE — ED Notes (Signed)
Pt ambulated to the restroom with no complaints, and no assistance.

## 2018-05-09 NOTE — ED Notes (Signed)
Pt educated on importance of follow up with orthopedic specialist. He verbalizes understanding.

## 2018-05-09 NOTE — ED Notes (Signed)
ED Provider at bedside. 

## 2018-05-09 NOTE — ED Notes (Signed)
Bed: WA02 Expected date:  Expected time:  Means of arrival:  Comments: 42 yo M/ left arm pain

## 2018-05-09 NOTE — Discharge Instructions (Addendum)
Return here as needed.  Follow-up with the orthopedist provided.  Use ice and heat on the areas that are sore.  Your x-rays did not show any abnormalities.

## 2018-05-09 NOTE — ED Triage Notes (Signed)
Pt BIB EMS for L arm pain starting 2 days ago. PMS intact. Pain 10/10. Hx previous fx to L ulna 10 years ago. HTN.  164/99 80 97%
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
## 2018-06-21 ENCOUNTER — Encounter (HOSPITAL_COMMUNITY): Payer: Self-pay | Admitting: Emergency Medicine

## 2018-06-21 ENCOUNTER — Observation Stay (HOSPITAL_COMMUNITY)
Admission: EM | Admit: 2018-06-21 | Discharge: 2018-06-22 | Disposition: A | Discharge status: Home / Self Care | Payer: Self-pay | Attending: Internal Medicine | Admitting: Internal Medicine

## 2018-06-21 ENCOUNTER — Emergency Department (HOSPITAL_COMMUNITY): Payer: Self-pay

## 2018-06-21 DIAGNOSIS — I10 Essential (primary) hypertension: Secondary | ICD-10-CM | POA: Diagnosis present

## 2018-06-21 DIAGNOSIS — N179 Acute kidney failure, unspecified: Secondary | ICD-10-CM

## 2018-06-21 DIAGNOSIS — R0902 Hypoxemia: Secondary | ICD-10-CM

## 2018-06-21 DIAGNOSIS — F411 Generalized anxiety disorder: Secondary | ICD-10-CM | POA: Diagnosis present

## 2018-06-21 DIAGNOSIS — M109 Gout, unspecified: Secondary | ICD-10-CM | POA: Insufficient documentation

## 2018-06-21 DIAGNOSIS — Z791 Long term (current) use of non-steroidal anti-inflammatories (NSAID): Secondary | ICD-10-CM | POA: Insufficient documentation

## 2018-06-21 DIAGNOSIS — R509 Fever, unspecified: Secondary | ICD-10-CM

## 2018-06-21 DIAGNOSIS — K219 Gastro-esophageal reflux disease without esophagitis: Secondary | ICD-10-CM | POA: Diagnosis present

## 2018-06-21 DIAGNOSIS — G629 Polyneuropathy, unspecified: Secondary | ICD-10-CM | POA: Insufficient documentation

## 2018-06-21 DIAGNOSIS — Z7901 Long term (current) use of anticoagulants: Secondary | ICD-10-CM | POA: Insufficient documentation

## 2018-06-21 DIAGNOSIS — Z79899 Other long term (current) drug therapy: Secondary | ICD-10-CM | POA: Insufficient documentation

## 2018-06-21 DIAGNOSIS — J441 Chronic obstructive pulmonary disease with (acute) exacerbation: Secondary | ICD-10-CM | POA: Insufficient documentation

## 2018-06-21 DIAGNOSIS — Z87891 Personal history of nicotine dependence: Secondary | ICD-10-CM

## 2018-06-21 DIAGNOSIS — Z880 Allergy status to penicillin: Secondary | ICD-10-CM | POA: Insufficient documentation

## 2018-06-21 DIAGNOSIS — J9601 Acute respiratory failure with hypoxia: Principal | ICD-10-CM | POA: Insufficient documentation

## 2018-06-21 LAB — BASIC METABOLIC PANEL
Anion gap: 10 (ref 5–15)
BUN: 26 mg/dL — AB (ref 6–20)
CO2: 24 mmol/L (ref 22–32)
CREATININE: 1.31 mg/dL — AB (ref 0.61–1.24)
Calcium: 9 mg/dL (ref 8.9–10.3)
Chloride: 107 mmol/L (ref 98–111)
GFR calc Af Amer: >60 mL/min (ref >60)
GFR calc non Af Amer: >60 mL/min (ref >60)
Glucose, Bld: 107 mg/dL — ABNORMAL HIGH (ref 70–99)
Potassium: 4.6 mmol/L (ref 3.5–5.1)
SODIUM: 141 mmol/L (ref 135–145)

## 2018-06-21 LAB — CBC
HCT: 45 % (ref 39.0–52.0)
Hemoglobin: 14.4 g/dL (ref 13.0–17.0)
MCH: 32.4 pg (ref 26.0–34.0)
MCHC: 32 g/dL (ref 30.0–36.0)
MCV: 101.4 fL — ABNORMAL HIGH (ref 80.0–100.0)
Platelets: 200 10*3/uL (ref 150–400)
RBC: 4.44 MIL/uL (ref 4.22–5.81)
RDW: 13 % (ref 11.5–15.5)
WBC: 18.2 10*3/uL — ABNORMAL HIGH (ref 4.0–10.5)
nRBC: 0 % (ref 0.0–0.2)

## 2018-06-21 LAB — CBC WITH DIFFERENTIAL/PLATELET
Abs Immature Granulocytes: 0.11 10*3/uL — ABNORMAL HIGH (ref 0.00–0.07)
Basophils Absolute: 0.1 10*3/uL (ref 0.0–0.1)
Basophils Relative: 1 %
Eosinophils Absolute: 0.1 10*3/uL (ref 0.0–0.5)
Eosinophils Relative: 1 %
HEMATOCRIT: 49.9 % (ref 39.0–52.0)
HEMOGLOBIN: 16.2 g/dL (ref 13.0–17.0)
Immature Granulocytes: 1 %
LYMPHS ABS: 1.3 10*3/uL (ref 0.7–4.0)
Lymphocytes Relative: 10 %
MCH: 32 pg (ref 26.0–34.0)
MCHC: 32.5 g/dL (ref 30.0–36.0)
MCV: 98.4 fL (ref 80.0–100.0)
MONO ABS: 0.7 10*3/uL (ref 0.1–1.0)
Monocytes Relative: 6 %
Neutro Abs: 10.9 10*3/uL — ABNORMAL HIGH (ref 1.7–7.7)
Neutrophils Relative %: 81 %
Platelets: 264 10*3/uL (ref 150–400)
RBC: 5.07 MIL/uL (ref 4.22–5.81)
RDW: 12.9 % (ref 11.5–15.5)
WBC: 13.2 10*3/uL — AB (ref 4.0–10.5)
nRBC: 0 % (ref 0.0–0.2)

## 2018-06-21 LAB — URINALYSIS, ROUTINE W REFLEX MICROSCOPIC
BACTERIA UA: NONE SEEN
BILIRUBIN URINE: NEGATIVE
Glucose, UA: NEGATIVE mg/dL
HGB URINE DIPSTICK: NEGATIVE
KETONES UR: 5 mg/dL — AB
LEUKOCYTES UA: NEGATIVE
NITRITE: NEGATIVE
Protein, ur: 30 mg/dL — AB
SPECIFIC GRAVITY, URINE: 1.025 (ref 1.005–1.030)
pH: 5 (ref 5.0–8.0)

## 2018-06-21 LAB — INFLUENZA PANEL BY PCR (TYPE A & B)
Influenza A By PCR: NEGATIVE
Influenza B By PCR: NEGATIVE

## 2018-06-21 LAB — RAPID URINE DRUG SCREEN, HOSP PERFORMED
Amphetamines: NOT DETECTED
Barbiturates: NOT DETECTED
Benzodiazepines: NOT DETECTED
COCAINE: NOT DETECTED
Opiates: NOT DETECTED
Tetrahydrocannabinol: POSITIVE — AB

## 2018-06-21 LAB — CREATININE, SERUM
Creatinine, Ser: 1.03 mg/dL (ref 0.61–1.24)
GFR calc Af Amer: >60 mL/min (ref >60)
GFR calc non Af Amer: >60 mL/min (ref >60)

## 2018-06-21 LAB — PROCALCITONIN: Procalcitonin: <0.1 ng/mL

## 2018-06-21 LAB — I-STAT CG4 LACTIC ACID, ED
Lactic Acid, Venous: 1.75 mmol/L (ref 0.5–1.9)
Lactic Acid, Venous: 1.45 mmol/L (ref 0.5–1.9)

## 2018-06-21 LAB — I-STAT TROPONIN, ED: TROPONIN I, POC: 0.02 ng/mL (ref 0.00–0.08)

## 2018-06-21 LAB — GLUCOSE, CAPILLARY: Glucose-Capillary: 100 mg/dL — ABNORMAL HIGH (ref 70–99)

## 2018-06-21 MED ORDER — ACETAMINOPHEN 325 MG PO TABS
650.0000 mg | ORAL_TABLET | Freq: Once | ORAL | Status: AC
  Administered 2018-06-21: 650 mg via ORAL
  Filled 2018-06-21: qty 2

## 2018-06-21 MED ORDER — ALBUTEROL SULFATE (2.5 MG/3ML) 0.083% IN NEBU
5.0000 mg | INHALATION_SOLUTION | Freq: Once | RESPIRATORY_TRACT | Status: AC
  Administered 2018-06-21: 5 mg via RESPIRATORY_TRACT
  Filled 2018-06-21: qty 6

## 2018-06-21 MED ORDER — METHYLPREDNISOLONE SODIUM SUCC 40 MG IJ SOLR
40.0000 mg | Freq: Three times a day (TID) | INTRAMUSCULAR | Status: DC
  Administered 2018-06-22: 40 mg via INTRAVENOUS
  Filled 2018-06-21 (×2): qty 1

## 2018-06-21 MED ORDER — DOXYCYCLINE HYCLATE 100 MG PO TABS
100.0000 mg | ORAL_TABLET | Freq: Once | ORAL | Status: AC
  Administered 2018-06-21: 100 mg via ORAL
  Filled 2018-06-21: qty 1

## 2018-06-21 MED ORDER — BUPROPION HCL ER (XL) 150 MG PO TB24
150.0000 mg | ORAL_TABLET | Freq: Every day | ORAL | Status: DC
  Administered 2018-06-21 – 2018-06-22 (×2): 150 mg via ORAL
  Filled 2018-06-21 (×2): qty 1

## 2018-06-21 MED ORDER — IPRATROPIUM-ALBUTEROL 0.5-2.5 (3) MG/3ML IN SOLN
3.0000 mL | Freq: Three times a day (TID) | RESPIRATORY_TRACT | Status: DC
  Administered 2018-06-22: 3 mL via RESPIRATORY_TRACT
  Filled 2018-06-21: qty 3

## 2018-06-21 MED ORDER — ALLOPURINOL 100 MG PO TABS
100.0000 mg | ORAL_TABLET | Freq: Every day | ORAL | Status: DC
  Administered 2018-06-21 – 2018-06-22 (×2): 100 mg via ORAL
  Filled 2018-06-21 (×2): qty 1

## 2018-06-21 MED ORDER — HYDROCODONE-ACETAMINOPHEN 5-325 MG PO TABS: 1.0000 | ORAL_TABLET | ORAL | Status: DC | PRN

## 2018-06-21 MED ORDER — INSULIN ASPART 100 UNIT/ML ~~LOC~~ SOLN
0.0000 [IU] | Freq: Three times a day (TID) | SUBCUTANEOUS | Status: DC
  Administered 2018-06-22: 1 [IU] via SUBCUTANEOUS

## 2018-06-21 MED ORDER — GABAPENTIN 300 MG PO CAPS
300.0000 mg | ORAL_CAPSULE | Freq: Three times a day (TID) | ORAL | Status: DC
  Administered 2018-06-21 – 2018-06-22 (×2): 300 mg via ORAL
  Filled 2018-06-21 (×2): qty 1

## 2018-06-21 MED ORDER — BUDESONIDE 0.5 MG/2ML IN SUSP
0.5000 mg | Freq: Two times a day (BID) | RESPIRATORY_TRACT | Status: DC
  Administered 2018-06-21 – 2018-06-22 (×2): 0.5 mg via RESPIRATORY_TRACT
  Filled 2018-06-21 (×2): qty 2

## 2018-06-21 MED ORDER — METHYLPREDNISOLONE SODIUM SUCC 125 MG IJ SOLR
125.0000 mg | Freq: Once | INTRAMUSCULAR | Status: AC
  Administered 2018-06-21: 125 mg via INTRAVENOUS
  Filled 2018-06-21: qty 2

## 2018-06-21 MED ORDER — IPRATROPIUM-ALBUTEROL 0.5-2.5 (3) MG/3ML IN SOLN: 3.0000 mL | RESPIRATORY_TRACT | Status: DC | PRN

## 2018-06-21 MED ORDER — ACETAMINOPHEN 325 MG PO TABS: 650.0000 mg | ORAL_TABLET | Freq: Four times a day (QID) | ORAL | Status: DC | PRN

## 2018-06-21 MED ORDER — METHYLPREDNISOLONE SODIUM SUCC 40 MG IJ SOLR: 40.0000 mg | Freq: Three times a day (TID) | INTRAMUSCULAR | Status: DC

## 2018-06-21 MED ORDER — METHYLPREDNISOLONE SODIUM SUCC 125 MG IJ SOLR: 125.0000 mg | Freq: Once | INTRAMUSCULAR | Status: DC

## 2018-06-21 MED ORDER — SODIUM CHLORIDE 0.9 % IV BOLUS
1000.0000 mL | Freq: Once | INTRAVENOUS | Status: AC
  Administered 2018-06-21: 1000 mL via INTRAVENOUS

## 2018-06-21 MED ORDER — ACETAMINOPHEN 650 MG RE SUPP: 650.0000 mg | Freq: Four times a day (QID) | RECTAL | Status: DC | PRN

## 2018-06-21 MED ORDER — METOPROLOL SUCCINATE ER 50 MG PO TB24
100.0000 mg | ORAL_TABLET | Freq: Every day | ORAL | Status: DC
  Administered 2018-06-21 – 2018-06-22 (×2): 100 mg via ORAL
  Filled 2018-06-21: qty 1
  Filled 2018-06-21: qty 2

## 2018-06-21 MED ORDER — AMLODIPINE BESYLATE 5 MG PO TABS
10.0000 mg | ORAL_TABLET | Freq: Every day | ORAL | Status: DC
  Administered 2018-06-21 – 2018-06-22 (×2): 10 mg via ORAL
  Filled 2018-06-21 (×2): qty 2

## 2018-06-21 MED ORDER — IPRATROPIUM-ALBUTEROL 0.5-2.5 (3) MG/3ML IN SOLN
3.0000 mL | Freq: Four times a day (QID) | RESPIRATORY_TRACT | Status: DC
  Administered 2018-06-21: 3 mL via RESPIRATORY_TRACT
  Filled 2018-06-21: qty 3

## 2018-06-21 MED ORDER — ALPRAZOLAM 0.5 MG PO TABS
0.5000 mg | ORAL_TABLET | Freq: Three times a day (TID) | ORAL | Status: DC
  Administered 2018-06-21 – 2018-06-22 (×2): 0.5 mg via ORAL
  Filled 2018-06-21 (×2): qty 1

## 2018-06-21 MED ORDER — SODIUM CHLORIDE 0.9 % IV SOLN
INTRAVENOUS | Status: DC
  Administered 2018-06-21 – 2018-06-22 (×2): via INTRAVENOUS

## 2018-06-21 MED ORDER — ONDANSETRON HCL 4 MG/2ML IJ SOLN: 4.0000 mg | Freq: Four times a day (QID) | INTRAMUSCULAR | Status: DC | PRN

## 2018-06-21 MED ORDER — ONDANSETRON HCL 4 MG PO TABS: 4.0000 mg | ORAL_TABLET | Freq: Four times a day (QID) | ORAL | Status: DC | PRN

## 2018-06-21 MED ORDER — SENNOSIDES-DOCUSATE SODIUM 8.6-50 MG PO TABS: 1.0000 | ORAL_TABLET | Freq: Every evening | ORAL | Status: DC | PRN

## 2018-06-21 MED ORDER — ENOXAPARIN SODIUM 60 MG/0.6ML ~~LOC~~ SOLN
50.0000 mg | SUBCUTANEOUS | Status: DC
  Administered 2018-06-21: 50 mg via SUBCUTANEOUS
  Filled 2018-06-21: qty 0.6

## 2018-06-21 NOTE — ED Notes (Signed)
Bed: [id number] Expected date:  Expected time:  Means of arrival:  Comments: For Res. B

## 2018-06-21 NOTE — ED Notes (Signed)
Report given to Tom, RN.

## 2018-06-21 NOTE — ED Notes (Signed)
ED TO INPATIENT HANDOFF REPORT  Name/Age/Gender Brandon Garza 42 y.o. male  Code Status    Code Status Orders  (From admission, onward)         Start     Ordered   06/21/18 1901  Full code  Continuous     06/21/18 1902        Code Status History    This patient has a current code status but no historical code status.      Home/SNF/Other Home  Chief Complaint Shortness of Breath; Back Pain   Level of Care/Admitting Diagnosis ED Disposition    ED Disposition Condition Comment   Admit  Hospital Area: [facility] [[id number]]  Level of Care: Med-Surg [16]  Diagnosis: Acute respiratory failure with hypoxia [facility]) [[id number]]  Admitting Physician: Stephania Fragmin [facility] [[id number]]  Attending Physician: Stephania Fragmin [facility] [[id number]]  PT Class (Do Not Modify): Observation [104]  PT Acc Code (Do Not Modify): Observation [10022]       Medical History Past Medical History:  Diagnosis Date  . Acid reflux   . Allergy   . Anxiety   . Arthritis   . Asthma   . Bronchitis   . Depression with anxiety   . H/O degenerative disc disease   . 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 Hypertension   . Neuromuscular disorder (HCC)     Allergies Allergies  Allergen Reactions  . Amoxicillin Hives    Has patient had a PCN reaction causing immediate rash, facial/tongue/throat swelling, SOB or lightheadedness with hypotension: no Has patient had a PCN reaction causing severe rash involving mucus membranes or skin necrosis: no Has patient had a PCN reaction that required hospitalization: on Has patient had a PCN reaction occurring within the last 10 years: no If all of the above answers are "NO", then may proceed with Cephalosporin use.     IV Location/Drains/Wounds Patient Lines/Drains/Airways Status   Active Line/Drains/Airways    Name:   Placement date:   Placement time:   Site:   Days:   Peripheral IV 06/21/18 Left Antecubital   06/21/18    1539     Antecubital   less than 1          Labs/Imaging Results for orders placed or performed during the hospital encounter of 06/21/18 (from the past 48 hour(s))  I-Stat Troponin, ED (not at [facility])     Status: None   Collection Time: 06/21/18  3:39 PM  Result Value Ref Range   Troponin i, poc 0.02 0.00 - 0.08 ng/mL   Comment 3            Comment: Due to the release kinetics of cTnI, a negative result within the first hours of the onset of symptoms does not rule out myocardial infarction with certainty. If myocardial infarction is still suspected, repeat the test at appropriate intervals.   I-Stat CG4 Lactic Acid, ED     Status: None   Collection Time: 06/21/18  3:41 PM  Result Value Ref Range   Lactic Acid, Venous 1.75 0.5 - 1.9 mmol/L  Basic metabolic panel     Status: Abnormal   Collection Time: 06/21/18  3:46 PM  Result Value Ref Range   Sodium 141 135 - 145 mmol/L   Potassium 4.6 3.5 - 5.1 mmol/L   Chloride 107 98 - 111 mmol/L   CO2 24 22 - 32 mmol/L   Glucose, Bld 107 (H) 70 - 99 mg/dL  BUN 26 (H) 6 - 20 mg/dL   Creatinine, Ser 1.611.31 (H) 0.61 - 1.24 mg/dL   Calcium 9.0 8.9 - 09.610.3 mg/dL   GFR calc non Af Amer >60 >60 mL/min   GFR calc Af Amer >60 >60 mL/min   Anion gap 10 5 - 15    Comment: Performed at [facility], [address] [place], [place] [address]  CBC with Differential     Status: Abnormal   Collection Time: 06/21/18  3:46 PM  Result Value Ref Range   WBC 13.2 (H) 4.0 - 10.5 K/uL   RBC 5.07 4.22 - 5.81 MIL/uL   Hemoglobin 16.2 13.0 - 17.0 g/dL   HCT 98.149.9 19.139.0 - 47.852.0 %   MCV 98.4 80.0 - 100.0 fL   MCH 32.0 26.0 - 34.0 pg   MCHC 32.5 30.0 - 36.0 g/dL   RDW 29.512.9 62.111.5 - 30.815.5 %   Platelets 264 150 - 400 K/uL   nRBC 0.0 0.0 - 0.2 %   Neutrophils Relative % 81 %   Neutro Abs 10.9 (H) 1.7 - 7.7 K/uL   Lymphocytes Relative 10 %   Lymphs Abs 1.3 0.7 - 4.0 K/uL   Monocytes Relative 6 %   Monocytes Absolute 0.7 0.1 - 1.0 K/uL   Eosinophils  Relative 1 %   Eosinophils Absolute 0.1 0.0 - 0.5 K/uL   Basophils Relative 1 %   Basophils Absolute 0.1 0.0 - 0.1 K/uL   Immature Granulocytes 1 %   Abs Immature Granulocytes 0.11 (H) 0.00 - 0.07 K/uL    Comment: Performed at [facility], [address] [place], [place] [address]  Urinalysis, Routine w reflex microscopic     Status: Abnormal   Collection Time: 06/21/18  5:45 PM  Result Value Ref Range   Color, Urine YELLOW YELLOW   APPearance CLEAR CLEAR   Specific Gravity, Urine 1.025 1.005 - 1.030   pH 5.0 5.0 - 8.0   Glucose, UA NEGATIVE NEGATIVE mg/dL   Hgb urine dipstick NEGATIVE NEGATIVE   Bilirubin Urine NEGATIVE NEGATIVE   Ketones, ur 5 (A) NEGATIVE mg/dL   Protein, ur 30 (A) NEGATIVE mg/dL   Nitrite NEGATIVE NEGATIVE   Leukocytes, UA NEGATIVE NEGATIVE   RBC / HPF 0-5 0 - 5 RBC/hpf   WBC, UA 0-5 0 - 5 WBC/hpf   Bacteria, UA NONE SEEN NONE SEEN   Mucus PRESENT    Hyaline Casts, UA PRESENT     Comment: Performed at [facility], [address] [place], [place] [address]  Influenza panel by PCR (type A & B)     Status: None   Collection Time: 06/21/18  5:45 PM  Result Value Ref Range   Influenza A By PCR NEGATIVE NEGATIVE   Influenza B By PCR NEGATIVE NEGATIVE    Comment: (NOTE) The Xpert Xpress Flu assay is intended as an aid in the diagnosis of  influenza and should not be used as a sole basis for treatment.  This  assay is FDA approved for nasopharyngeal swab specimens only. Nasal  washings and aspirates are unacceptable for Xpert Xpress Flu testing. Performed at [facility], [address] [place], [place] [address]   I-Stat CG4 Lactic Acid, ED     Status: None   Collection Time: 06/21/18  6:03 PM  Result Value Ref Range   Lactic Acid, Venous 1.45 0.5 - 1.9 mmol/L   Dg Chest 2 View  Result Date: 06/21/2018 CLINICAL  DATA:  Fever, nonproductive cough, and diaphoresis for 4 days. EXAM: CHEST - 2 VIEW  COMPARISON:  09/10/2017 FINDINGS: The heart size and mediastinal contours are within normal limits. Pleural-parenchymal scarring is noted at the right lung base. No evidence of pulmonary infiltrate or edema. Heart size and mediastinal contours are normal. IMPRESSION: No active cardiopulmonary disease. Electronically Signed   By: Myles Rosenthal M.D.   On: 06/21/2018 16:18    Pending Labs Unresulted Labs (From admission, onward)    Start     Ordered   06/28/18 0500  Creatinine, serum  (enoxaparin (LOVENOX)    CrCl >/= 30 ml/min)  Weekly,   R    Comments:  while on enoxaparin therapy    06/21/18 1902   06/22/18 0500  Procalcitonin  Daily,   R     06/21/18 1858   06/22/18 0500  Magnesium  Tomorrow morning,   R     06/21/18 1902   06/22/18 0500  Comprehensive metabolic panel  Tomorrow morning,   R     06/21/18 1902   06/22/18 0500  CBC  Tomorrow morning,   R     06/21/18 1902   06/22/18 0500  Brain natriuretic peptide  Tomorrow morning,   R     06/21/18 1902   06/21/18 1900  HIV antibody (Routine Testing)  Once,   R     06/21/18 1902   06/21/18 1900  CBC  (enoxaparin (LOVENOX)    CrCl >/= 30 ml/min)  Once,   R    Comments:  Baseline for enoxaparin therapy IF NOT ALREADY DRAWN.  Notify MD if PLT < 100 K.    06/21/18 1902   06/21/18 1900  Creatinine, serum  (enoxaparin (LOVENOX)    CrCl >/= 30 ml/min)  Once,   R    Comments:  Baseline for enoxaparin therapy IF NOT ALREADY DRAWN.    06/21/18 1902   06/21/18 1858  Procalcitonin - Baseline  ONCE - STAT,   STAT     06/21/18 1858   06/21/18 1857  Blood culture (routine x 2)  BLOOD CULTURE X 2,   STAT     06/21/18 1858   06/21/18 1857  Respiratory Panel by PCR  (Respiratory virus panel with precautions)  Once,   R     06/21/18 1858          Vitals/Pain Today's Vitals   06/21/18 1830 06/21/18 1900 06/21/18 1914 06/21/18 1926  BP: 112/86   124/90  Pulse: 92 91  99  Resp: 15   16  Temp:      TempSrc:      SpO2: 96%   99%  Weight:       Height:      PainSc:   0-No pain     Isolation Precautions Droplet precaution  Medications Medications  methylPREDNISolone sodium succinate (SOLU-MEDROL) 125 mg/2 mL injection 125 mg (has no administration in time range)  doxycycline (VIBRA-TABS) tablet 100 mg (has no administration in time range)  enoxaparin (LOVENOX) injection 40 mg (has no administration in time range)  0.9 %  sodium chloride infusion (has no administration in time range)  acetaminophen (TYLENOL) tablet 650 mg (has no administration in time range)    Or  acetaminophen (TYLENOL) suppository 650 mg (has no administration in time range)  HYDROcodone-acetaminophen (NORCO/VICODIN) 5-325 MG per tablet 1-2 tablet (has no administration in time range)  senna-docusate (Senokot-S) tablet 1 tablet (has no administration in time range)  ondansetron (ZOFRAN) tablet 4  mg (has no administration in time range)    Or  ondansetron (ZOFRAN) injection 4 mg (has no administration in time range)  insulin aspart (novoLOG) injection 0-9 Units (has no administration in time range)  ipratropium-albuterol (DUONEB) 0.5-2.5 (3) MG/3ML nebulizer solution 3 mL (has no administration in time range)  ipratropium-albuterol (DUONEB) 0.5-2.5 (3) MG/3ML nebulizer solution 3 mL (has no administration in time range)  budesonide (PULMICORT) nebulizer solution 0.5 mg (has no administration in time range)  methylPREDNISolone sodium succinate (SOLU-MEDROL) 40 mg/mL injection 40 mg (has no administration in time range)  allopurinol (ZYLOPRIM) tablet 100 mg (has no administration in time range)  amLODipine (NORVASC) tablet 10 mg (has no administration in time range)  metoprolol succinate (TOPROL-XL) 24 hr tablet 100 mg (has no administration in time range)  ALPRAZolam (XANAX) tablet 0.5 mg (has no administration in time range)  buPROPion (WELLBUTRIN XL) 24 hr tablet 150 mg (has no administration in time range)  gabapentin (NEURONTIN) capsule 300 mg (has no  administration in time range)  albuterol (PROVENTIL) (2.5 MG/3ML) 0.083% nebulizer solution 5 mg (5 mg Nebulization Given 06/21/18 1606)  sodium chloride 0.9 % bolus 1,000 mL (0 mLs Intravenous Stopped 06/21/18 1716)  acetaminophen (TYLENOL) tablet 650 mg (650 mg Oral Given 06/21/18 1612)  sodium chloride 0.9 % bolus 1,000 mL (1,000 mLs Intravenous New Bag/Given 06/21/18 1721)    Mobility walks

## 2018-06-21 NOTE — ED Triage Notes (Signed)
Pt reports that he ben having chills, cough, SOB and back pain for 7-10 days. Pt reports has been lightheaded and delirious over the past 3 days. Reports that cough is productive sometimes and "nasal discharge".  When patient talks O2 drops to 87-88% on room air. Patient denies being on O2 at home but uses inhalers.

## 2018-06-21 NOTE — H&P (Signed)
History and Physical    [name]:[id number] DOB: 02-03-1976 DOA: 06/21/2018  PCP: Marcine Matar, MD Patient coming from: Home  Chief Complaint: Shortness of breath  HPI: Brandon Garza is a 42 y.o. male with medical history significant of essential hypertension, general anxiety disorder, GERD, history of tobacco use came to the hospital with shortness of breath.  Patient states for the past week he has felt progressive shortness of breath, diaphoresis and off-and-on fevers and chills.  At first he did not want to come to the hospital for evaluation but eventually was insisted by his family to have him evaluated. In the ER initially was noted to be febrile with temperature of 100.7, sinus tachycardia and diaphoresis.  His oxygen saturation was 86% on room air.  He was started on bronchodilators and steroids were given.  Chest x-ray was negative for any acute pathology.  With ambulation his oxygen saturation remained around 85%.  Due to this medical team was consulted for admission.   Review of Systems: As per HPI otherwise 10 point review of systems negative.  Review of Systems Otherwise negative except as per HPI, including: General: Denies fever, chills, night sweats or unintended weight loss. Resp: Denies hemoptysis Cardiac: Denies chest pain, palpitations, orthopnea, paroxysmal nocturnal dyspnea. GI: Denies abdominal pain, nausea, vomiting, diarrhea or constipation GU: Denies dysuria, frequency, hesitancy or incontinence MS: Denies muscle aches, joint pain or swelling Neuro: Denies headache, neurologic deficits (focal weakness, numbness, tingling), abnormal gait Psych: Denies anxiety, depression, SI/HI/AVH Skin: Denies new rashes or lesions ID: Denies sick contacts, exotic exposures, travel  Past Medical History:  Diagnosis Date  . Acid reflux   . Allergy   . Anxiety   . Arthritis   . Asthma   . Bronchitis   . Depression with anxiety   . H/O degenerative disc  disease   . 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 Hypertension   . Neuromuscular disorder [facility])     Past Surgical History:  Procedure Laterality Date  . CORNEAL TRANSPLANT     left eye  . DOBUTAMINE STRESS ECHO  10/12/2008   normal  . left hip surgery    . NASAL SEPTUM SURGERY    . orthopedic left arm surgery     done following an accident  . TONSILLECTOMY      SOCIAL HISTORY:  reports that he has been smoking. He quit smokeless tobacco use about 6 years ago.  His smokeless tobacco use included snuff and chew. He reports previous alcohol use of about 6.0 - 12.0 standard drinks of alcohol per week. He reports current drug use. Drugs: Benzodiazepines and Marijuana.  Allergies  Allergen Reactions  . Amoxicillin Hives    Has patient had a PCN reaction causing immediate rash, facial/tongue/throat swelling, SOB or lightheadedness with hypotension: no Has patient had a PCN reaction causing severe rash involving mucus membranes or skin necrosis: no Has patient had a PCN reaction that required hospitalization: on Has patient had a PCN reaction occurring within the last 10 years: no If all of the above answers are "NO", then may proceed with Cephalosporin use.     FAMILY HISTORY: Family History  Problem Relation Age of Onset  . Heart Problems Father   . Stroke Maternal Grandfather   . Heart Problems Maternal Grandfather   . Mental illness Paternal Grandmother   . Heart Problems Paternal Grandfather      Prior to Admission medications   Medication Sig  Start Date End Date Taking? Authorizing Provider  acetaminophen (TYLENOL) 500 MG tablet Take 2 tablets (1,000 mg total) by mouth every 6 (six) hours as needed. 08/14/16  Yes Arby BarrettePfeiffer, Marcy, MD  albuterol (PROVENTIL HFA;VENTOLIN HFA) 108 (90 Base) MCG/ACT inhaler Inhale 2 puffs into the lungs every 6 (six) hours as needed for wheezing or shortness of breath. 08/25/17  Yes Storm FriskWright, Patrick E, MD  allopurinol (ZYLOPRIM) 100  MG tablet Take 1 tablet (100 mg total) by mouth daily. 08/25/17  Yes Storm FriskWright, Patrick E, MD  ALPRAZolam Prudy Feeler(XANAX) 0.5 MG tablet 1 tablet 3 times daily for 1 week 1 tablet 2 times daily for 1 week 1 tablet 1 time daily for 1 week 1/2 tablet 1 time daily for 1 week Patient taking differently: Take 0.5 mg by mouth 3 (three) times daily.  02/17/18  Yes Georgian CoMcClung, Angela M, PA-C  amLODipine (NORVASC) 10 MG tablet Take 1 tablet (10 mg total) by mouth daily. 09/28/17  Yes Marcine MatarJohnson, Deborah B, MD  buPROPion (WELLBUTRIN XL) 150 MG 24 hr tablet Take 1 tablet (150 mg total) by mouth daily. 08/25/17  Yes Storm FriskWright, Patrick E, MD  fluticasone (FLONASE) 50 MCG/ACT nasal spray Place 1 spray into both nostrils daily. Patient taking differently: Place 1 spray into both nostrils daily as needed for allergies.  05/02/18  Yes Khatri, Hina, PA-C  fluticasone (FLOVENT HFA) 110 MCG/ACT inhaler Inhale 2 puffs into the lungs 2 (two) times daily. 08/25/17  Yes Storm FriskWright, Patrick E, MD  gabapentin (NEURONTIN) 300 MG capsule Take 300 mg by mouth 3 (three) times daily.   Yes [provider]  ibuprofen (ADVIL,MOTRIN) 800 MG tablet Take 1 tablet (800 mg total) by mouth every 6 (six) hours as needed. 05/09/18  Yes Lawyer, Cristal Deerhristopher, PA-C  metoprolol succinate (TOPROL-XL) 100 MG 24 hr tablet TAKE 1 TABLET (100 MG TOTAL) BY MOUTH DAILY. TAKE WITH OR IMMEDIATELY FOLLOWING A MEAL. Patient taking differently: Take 100 mg by mouth daily.  08/25/17  Yes Storm FriskWright, Patrick E, MD  benzonatate (TESSALON) 100 MG capsule Take 2 capsules (200 mg total) by mouth every 8 (eight) hours. Patient not taking: Reported on 06/21/2018 05/02/18   Dietrich PatesKhatri, Hina, PA-C  colchicine 0.6 MG tablet 1.2 mg at the first sign of flare, followed in 1 hour with a single dose of 0.6 mg (maximum: 1.8 mg within 1 hour).  Repeat as needed. Patient not taking: Reported on 06/21/2018 08/25/17   Storm FriskWright, Patrick E, MD  famotidine (PEPCID) 20 MG tablet Take 1 tablet (20 mg total) by  mouth 2 (two) times daily. Patient not taking: Reported on 06/21/2018 08/25/17   Storm FriskWright, Patrick E, MD  omeprazole (PRILOSEC) 20 MG capsule Take 1 capsule (20 mg total) by mouth daily for 14 days. Patient not taking: Reported on 06/21/2018 12/17/17 12/31/17  Aviva KluverMurray, Alyssa B, PA-C  traMADol (ULTRAM) 50 MG tablet Take 1 tablet (50 mg total) by mouth every 6 (six) hours as needed for severe pain. Patient not taking: Reported on 06/21/2018 05/09/18   Charlestine NightLawyer, Christopher, PA-C    Physical Exam: Vitals:   06/21/18 1608 06/21/18 1700 06/21/18 1719 06/21/18 1745  BP: 124/71 (!) 155/114 (!) 155/114   Pulse: 97 (!) 111 99 99  Resp:   18   Temp:   97.9 F (36.6 C)   TempSrc:   Oral   SpO2: 92% 92% 93% (!) 89%  Weight:      Height:          Constitutional: NAD, calm, comfortable Eyes:  PERRL, lids and conjunctivae normal ENMT: Mucous membranes are moist. Posterior pharynx clear of any exudate or lesions.Normal dentition.  Neck: normal, supple, no masses, no thyromegaly Respiratory: Diffuse coarse breath sounds Cardiovascular: Regular rate and rhythm, no murmurs / rubs / gallops. No extremity edema. 2+ pedal pulses. No carotid bruits.  Abdomen: no tenderness, no masses palpated. No hepatosplenomegaly. Bowel sounds positive.  Musculoskeletal: no clubbing / cyanosis. No joint deformity upper and lower extremities. Good ROM, no contractures. Normal muscle tone.  Skin: no rashes, lesions, ulcers. No induration Neurologic: CN 2-12 grossly intact. Sensation intact, DTR normal. Strength 5/5 in all 4.  Psychiatric: Normal judgment and insight. Alert and oriented x 3. Normal mood.     Labs on Admission: I have personally reviewed following labs and imaging studies  CBC: Recent Labs  Lab 06/21/18 1546  WBC 13.2*  NEUTROABS 10.9*  HGB 16.2  HCT 49.9  MCV 98.4  PLT 264   Basic Metabolic Panel: Recent Labs  Lab 06/21/18 1546  NA 141  K 4.6  CL 107  CO2 24  GLUCOSE 107*  BUN 26*    CREATININE 1.31*  CALCIUM 9.0   GFR: Estimated Creatinine Clearance: 90.7 mL/min (A) (by C-G formula based on SCr of 1.31 mg/dL (H)). Liver Function Tests: No results for input(s): AST, ALT, ALKPHOS, BILITOT, PROT, ALBUMIN in the last 168 hours. No results for input(s): LIPASE, AMYLASE in the last 168 hours. No results for input(s): AMMONIA in the last 168 hours. Coagulation Profile: No results for input(s): INR, PROTIME in the last 168 hours. Cardiac Enzymes: No results for input(s): CKTOTAL, CKMB, CKMBINDEX, TROPONINI in the last 168 hours. BNP (last 3 results) No results for input(s): PROBNP in the last 8760 hours. HbA1C: No results for input(s): HGBA1C in the last 72 hours. CBG: No results for input(s): GLUCAP in the last 168 hours. Lipid Profile: No results for input(s): CHOL, HDL, LDLCALC, TRIG, CHOLHDL, LDLDIRECT in the last 72 hours. Thyroid Function Tests: No results for input(s): TSH, T4TOTAL, FREET4, T3FREE, THYROIDAB in the last 72 hours. Anemia Panel: No results for input(s): VITAMINB12, FOLATE, FERRITIN, TIBC, IRON, RETICCTPCT in the last 72 hours. Urine analysis:    Component Value Date/Time   COLORURINE YELLOW 06/21/2018 1745   APPEARANCEUR CLEAR 06/21/2018 1745   LABSPEC 1.025 06/21/2018 1745   PHURINE 5.0 06/21/2018 1745   GLUCOSEU NEGATIVE 06/21/2018 1745   HGBUR NEGATIVE 06/21/2018 1745   BILIRUBINUR NEGATIVE 06/21/2018 1745   KETONESUR 5 (A) 06/21/2018 1745   PROTEINUR 30 (A) 06/21/2018 1745   UROBILINOGEN 0.2 12/16/2010 0942   NITRITE NEGATIVE 06/21/2018 1745   LEUKOCYTESUR NEGATIVE 06/21/2018 1745   Sepsis Labs: !!!!!!!!!!!!!!!!!!!!!!!!!!!!!!!!!!!!!!!!!!!! @LABRCNTIP (procalcitonin:4,lacticidven:4) )No results found for this or any previous visit (from the past 240 hour(s)).   Radiological Exams on Admission: Dg Chest 2 View  Result Date: 06/21/2018 CLINICAL DATA:  Fever, nonproductive cough, and diaphoresis for 4 days. EXAM: CHEST - 2 VIEW  COMPARISON:  09/10/2017 FINDINGS: The heart size and mediastinal contours are within normal limits. Pleural-parenchymal scarring is noted at the right lung base. No evidence of pulmonary infiltrate or edema. Heart size and mediastinal contours are normal. IMPRESSION: No active cardiopulmonary disease. Electronically Signed   By: Myles Rosenthal M.D.   On: 06/21/2018 16:18     All images have been reviewed by me personally.  EKG: Independently reviewed.  No acute ST-T changes  Assessment/Plan Principal Problem:   Acute respiratory failure with hypoxia [facility]) Active Problems:   Essential hypertension  GAD (generalized anxiety disorder)   GERD (gastroesophageal reflux disease)   Former smoker    Acute hypoxic Resp Distress, 86% on RA Acute exacerbation of Mild Intermittent COPD SIRS/Fever, unknown source --Nebulizers standing and as needed ordered, IV Solu-Medrol which we will eventually transition to oral prednisone - Accu-Cheks and sliding scale while on steroids -Chest x-ray= Neg -Antibiotics= Doxycycline, Check ProCal-  - Antitussives as needed -Supplemental oxygen as needed, check ambulatory pulse ox if needed - Continue to provide supportive care - Flu Swab - Neg, Check Resp Panel  Essential hypertension -Continue Norvasc 10 mg daily, metoprolol 100 mg orally daily  History of general anxiety disorder -Continue Xanax 3 times daily, Wellbutrin  Peripheral neuropathy -Gabapentin 300 mg 3 times daily  Gout -Allopurinol daily   DVT prophylaxis: Lovenox Code Status: Full code Family Communication: None at bedside Disposition Plan:   To be determined Consults called: None Admission status: Observation   Time Spent: 65 minutes.  >50% of the time was devoted to discussing the patients care, assessment, plan and disposition with other care givers along with counseling the patient about the risks and benefits of treatment.    Ankit Joline Maxcyhirag Amin MD [facility] Pager  [phone]  If 7PM-7AM, please contact night-coverage [email or web address] Password [email or web address]  06/21/2018, 7:03 PM

## 2018-06-21 NOTE — ED Provider Notes (Signed)
[facility]-EMERGENCY DEPT Provider Note   CSN: [id number] Arrival date & time: 06/21/18  1512     History   Chief Complaint Chief Complaint  Patient presents with  . Shortness of Breath  . Chills  . Cough  . Back Pain    HPI Brandon Garza is a 42 y.o. male.  42 year old male with history of asthma and COPD presents with complaint of cough, sinus congestion, wheezing, with report of sweats and chills for the past week.  Also reports pain across his low back does not radiate.  No known sick contacts, has not taken anything for his symptoms.  Patient states that he is out of his rescue inhaler but has been taking his daily inhaler as prescribed.  Patient is brought in by his aunt today, patient otherwise lives alone and on states that she could not convince him to come in any earlier to be seen.  Patient denies chest pain and difficulty breathing, denies abdominal pain, changes in bowel or bladder habits.  Patient has not been monitoring his temperature at home and states that he feels hot and cold at times.  No other complaints or concerns.     Past Medical History:  Diagnosis Date  . Acid reflux   . Allergy   . Anxiety   . Arthritis   . Asthma   . Bronchitis   . Depression with anxiety   . H/O degenerative disc disease   . 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 Hypertension   . Neuromuscular disorder [facility])     Patient Active Problem List   Diagnosis Date Noted  . Acute respiratory failure with hypoxia (HCC) 06/21/2018  . Former smoker 09/28/2017  . History of gout 09/28/2017  . Polyarthralgia 09/28/2017  . Community acquired pneumonia of right middle lobe of lung (HCC) 09/15/2017  . History of heroin use 07/09/2017  . Mild intermittent asthma with acute exacerbation 12/25/2016  . GERD (gastroesophageal reflux disease) 10/20/2012  . Essential hypertension 02/12/2012  . Stuttering 02/12/2012  . GAD (generalized anxiety disorder)  02/12/2012  . Nicotine use disorder 02/12/2012  . Chronic back pain 02/12/2012  . Hip dislocation, left (HCC) 02/12/2012  . Gout of multiple sites 08/04/2011    Past Surgical History:  Procedure Laterality Date  . CORNEAL TRANSPLANT     left eye  . DOBUTAMINE STRESS ECHO  10/12/2008   normal  . left hip surgery    . NASAL SEPTUM SURGERY    . orthopedic left arm surgery     done following an accident  . TONSILLECTOMY          Home Medications    Prior to Admission medications   Medication Sig Start Date End Date Taking? Authorizing Provider  acetaminophen (TYLENOL) 500 MG tablet Take 2 tablets (1,000 mg total) by mouth every 6 (six) hours as needed. 08/14/16  Yes Arby Barrette, MD  albuterol (PROVENTIL HFA;VENTOLIN HFA) 108 (90 Base) MCG/ACT inhaler Inhale 2 puffs into the lungs every 6 (six) hours as needed for wheezing or shortness of breath. 08/25/17  Yes Storm Frisk, MD  allopurinol (ZYLOPRIM) 100 MG tablet Take 1 tablet (100 mg total) by mouth daily. 08/25/17  Yes Storm Frisk, MD  ALPRAZolam Prudy Feeler) 0.5 MG tablet 1 tablet 3 times daily for 1 week 1 tablet 2 times daily for 1 week 1 tablet 1 time daily for 1 week 1/2 tablet 1 time daily for 1 week Patient  taking differently: Take 0.5 mg by mouth 3 (three) times daily.  02/17/18  Yes Georgian Co M, PA-C  amLODipine (NORVASC) 10 MG tablet Take 1 tablet (10 mg total) by mouth daily. 09/28/17  Yes Marcine Matar, MD  buPROPion (WELLBUTRIN XL) 150 MG 24 hr tablet Take 1 tablet (150 mg total) by mouth daily. 08/25/17  Yes Storm Frisk, MD  fluticasone (FLONASE) 50 MCG/ACT nasal spray Place 1 spray into both nostrils daily. Patient taking differently: Place 1 spray into both nostrils daily as needed for allergies.  05/02/18  Yes Khatri, Hina, PA-C  fluticasone (FLOVENT HFA) 110 MCG/ACT inhaler Inhale 2 puffs into the lungs 2 (two) times daily. 08/25/17  Yes Storm Frisk, MD  gabapentin (NEURONTIN) 300 MG  capsule Take 300 mg by mouth 3 (three) times daily.   Yes [provider]  ibuprofen (ADVIL,MOTRIN) 800 MG tablet Take 1 tablet (800 mg total) by mouth every 6 (six) hours as needed. 05/09/18  Yes Lawyer, Cristal Deer, PA-C  metoprolol succinate (TOPROL-XL) 100 MG 24 hr tablet TAKE 1 TABLET (100 MG TOTAL) BY MOUTH DAILY. TAKE WITH OR IMMEDIATELY FOLLOWING A MEAL. Patient taking differently: Take 100 mg by mouth daily.  08/25/17  Yes Storm Frisk, MD  benzonatate (TESSALON) 100 MG capsule Take 2 capsules (200 mg total) by mouth every 8 (eight) hours. Patient not taking: Reported on 06/21/2018 05/02/18   Dietrich Pates, PA-C  colchicine 0.6 MG tablet 1.2 mg at the first sign of flare, followed in 1 hour with a single dose of 0.6 mg (maximum: 1.8 mg within 1 hour).  Repeat as needed. Patient not taking: Reported on 06/21/2018 08/25/17   Storm Frisk, MD  famotidine (PEPCID) 20 MG tablet Take 1 tablet (20 mg total) by mouth 2 (two) times daily. Patient not taking: Reported on 06/21/2018 08/25/17   Storm Frisk, MD  omeprazole (PRILOSEC) 20 MG capsule Take 1 capsule (20 mg total) by mouth daily for 14 days. Patient not taking: Reported on 06/21/2018 12/17/17 12/31/17  Aviva Kluver B, PA-C  traMADol (ULTRAM) 50 MG tablet Take 1 tablet (50 mg total) by mouth every 6 (six) hours as needed for severe pain. Patient not taking: Reported on 06/21/2018 05/09/18   Charlestine Night, PA-C    Family History Family History  Problem Relation Age of Onset  . Heart Problems Father   . Stroke Maternal Grandfather   . Heart Problems Maternal Grandfather   . Mental illness Paternal Grandmother   . Heart Problems Paternal Grandfather     Social History Social History   Tobacco Use  . Smoking status: Current Every Day Smoker    Last attempt to quit: 07/22/2011    Years since quitting: 6.9  . Smokeless tobacco: Former User    Types: Snuff, Dorna Bloom    Quit date: 07/22/2011  Substance Use Topics   . Alcohol use: Not Currently    Alcohol/week: 6.0 - 12.0 standard drinks    Types: 6 - 12 Standard drinks or equivalent per week    Comment: social  . Drug use: Yes    Types: Benzodiazepines, Marijuana    Comment: on occasion     Allergies   Amoxicillin   Review of Systems Review of Systems  Constitutional: Positive for chills and diaphoresis.  HENT: Positive for congestion and rhinorrhea. Negative for ear pain, sinus pressure and sinus pain.   Respiratory: Positive for cough and wheezing. Negative for shortness of breath.   Cardiovascular: Negative for chest  pain.  Gastrointestinal: Negative for abdominal pain, constipation, diarrhea, nausea and vomiting.  Genitourinary: Negative for decreased urine volume and difficulty urinating.  Musculoskeletal: Positive for back pain.  Skin: Negative for rash.  Allergic/Immunologic: Negative for immunocompromised state.  Neurological: Negative for weakness.  Hematological: Negative for adenopathy.  Psychiatric/Behavioral: Negative for confusion.  All other systems reviewed and are negative.    Physical Exam Updated Vital Signs BP (!) 155/114 (BP Location: Right Arm)   Pulse 99   Temp 97.9 F (36.6 C) (Oral)   Resp 18   Ht 5\' 10"  (1.778 m)   Wt 106.6 kg   SpO2 (!) 89%   BMI 33.72 kg/m   Physical Exam Vitals signs and nursing note reviewed.  Constitutional:      Appearance: He is well-developed. He is obese. He is diaphoretic.  HENT:     Head: Normocephalic and atraumatic.     Mouth/Throat:     Pharynx: No pharyngeal swelling or oropharyngeal exudate.  Neck:     Musculoskeletal: Neck supple.  Cardiovascular:     Rate and Rhythm: Regular rhythm. Tachycardia present.     Heart sounds: No murmur.  Pulmonary:     Effort: Pulmonary effort is normal.     Breath sounds: Decreased breath sounds, wheezing and rhonchi present.  Chest:     Chest wall: No tenderness.  Abdominal:     Palpations: Abdomen is soft.      Tenderness: There is no abdominal tenderness.  Musculoskeletal:     Right lower leg: No edema.     Left lower leg: No edema.  Lymphadenopathy:     Cervical: No cervical adenopathy.  Skin:    General: Skin is warm.  Neurological:     Mental Status: He is alert and oriented to person, place, and time.  Psychiatric:        Mood and Affect: Mood normal.        Behavior: Behavior normal.      ED Treatments / Results  Labs (all labs ordered are listed, but only abnormal results are displayed) Labs Reviewed  URINALYSIS, ROUTINE W REFLEX MICROSCOPIC - Abnormal; Notable for the following components:      Result Value   Ketones, ur 5 (*)    Protein, ur 30 (*)    All other components within normal limits  BASIC METABOLIC PANEL - Abnormal; Notable for the following components:   Glucose, Bld 107 (*)    BUN 26 (*)    Creatinine, Ser 1.31 (*)    All other components within normal limits  CBC WITH DIFFERENTIAL/PLATELET - Abnormal; Notable for the following components:   WBC 13.2 (*)    Neutro Abs 10.9 (*)    Abs Immature Granulocytes 0.11 (*)    All other components within normal limits  CULTURE, BLOOD (ROUTINE X 2)  CULTURE, BLOOD (ROUTINE X 2)  RESPIRATORY PANEL BY PCR  INFLUENZA PANEL BY PCR (TYPE A & B)  PROCALCITONIN  PROCALCITONIN  HIV ANTIBODY (ROUTINE TESTING W REFLEX)  CBC  CREATININE, SERUM  MAGNESIUM  COMPREHENSIVE METABOLIC PANEL  CBC  BRAIN NATRIURETIC PEPTIDE  I-STAT CG4 LACTIC ACID, ED  I-STAT TROPONIN, ED  I-STAT CG4 LACTIC ACID, ED    EKG EKG Interpretation  Date/Time:  Tuesday June 21 2018 15:24:12 EST Ventricular Rate:  115 PR Interval:    QRS Duration: 94 QT Interval:  306 QTC Calculation: 424 R Axis:   6 Text Interpretation:  Sinus tachycardia Abnormal R-wave progression, early transition  increased  rate from prior 3/19 Confirmed by Meridee Score [phone]) on 06/21/2018 4:01:44 PM   Radiology Dg Chest 2 View  Result Date:  06/21/2018 CLINICAL DATA:  Fever, nonproductive cough, and diaphoresis for 4 days. EXAM: CHEST - 2 VIEW COMPARISON:  09/10/2017 FINDINGS: The heart size and mediastinal contours are within normal limits. Pleural-parenchymal scarring is noted at the right lung base. No evidence of pulmonary infiltrate or edema. Heart size and mediastinal contours are normal. IMPRESSION: No active cardiopulmonary disease. Electronically Signed   By: Myles Rosenthal M.D.   On: 06/21/2018 16:18    Procedures Procedures (including critical care time)  Medications Ordered in ED Medications  methylPREDNISolone sodium succinate (SOLU-MEDROL) 125 mg/2 mL injection 125 mg (has no administration in time range)  doxycycline (VIBRA-TABS) tablet 100 mg (has no administration in time range)  enoxaparin (LOVENOX) injection 40 mg (has no administration in time range)  0.9 %  sodium chloride infusion (has no administration in time range)  acetaminophen (TYLENOL) tablet 650 mg (has no administration in time range)    Or  acetaminophen (TYLENOL) suppository 650 mg (has no administration in time range)  HYDROcodone-acetaminophen (NORCO/VICODIN) 5-325 MG per tablet 1-2 tablet (has no administration in time range)  senna-docusate (Senokot-S) tablet 1 tablet (has no administration in time range)  ondansetron (ZOFRAN) tablet 4 mg (has no administration in time range)    Or  ondansetron (ZOFRAN) injection 4 mg (has no administration in time range)  insulin aspart (novoLOG) injection 0-9 Units (has no administration in time range)  ipratropium-albuterol (DUONEB) 0.5-2.5 (3) MG/3ML nebulizer solution 3 mL (has no administration in time range)  ipratropium-albuterol (DUONEB) 0.5-2.5 (3) MG/3ML nebulizer solution 3 mL (has no administration in time range)  budesonide (PULMICORT) nebulizer solution 0.5 mg (has no administration in time range)  methylPREDNISolone sodium succinate (SOLU-MEDROL) 40 mg/mL injection 40 mg (has no administration in  time range)  allopurinol (ZYLOPRIM) tablet 100 mg (has no administration in time range)  amLODipine (NORVASC) tablet 10 mg (has no administration in time range)  metoprolol succinate (TOPROL-XL) 24 hr tablet 100 mg (has no administration in time range)  ALPRAZolam (XANAX) tablet 0.5 mg (has no administration in time range)  buPROPion (WELLBUTRIN XL) 24 hr tablet 150 mg (has no administration in time range)  gabapentin (NEURONTIN) capsule 300 mg (has no administration in time range)  albuterol (PROVENTIL) (2.5 MG/3ML) 0.083% nebulizer solution 5 mg (5 mg Nebulization Given 06/21/18 1606)  sodium chloride 0.9 % bolus 1,000 mL (0 mLs Intravenous Stopped 06/21/18 1716)  acetaminophen (TYLENOL) tablet 650 mg (650 mg Oral Given 06/21/18 1612)  sodium chloride 0.9 % bolus 1,000 mL (1,000 mLs Intravenous New Bag/Given 06/21/18 1721)     Initial Impression / Assessment and Plan / ED Course  I have reviewed the triage vital signs and the nursing notes.  Pertinent labs & imaging results that were available during my care of the patient were reviewed by me and considered in my medical decision making (see chart for details).  Clinical Course as of Jun 21 1905  Tue Jun 21, 2018  1739 He is presenting to the emergency department with multiple days of feeling lightheaded cough sinus congestion short of breath fevers and chills little bit of a headache.  Here he is sat was low on arrival.  His chest x-ray does not show any obvious infiltrate.  His labs shows a little bit of an AK I and an elevated white count.  Checking flu urine and continuing with some breathing  treatments and IV fluids.   [MB]  1905 Patient was taken off of his 2 L nasal cannula, O2 sats dropped back into the mid to upper 80% range, patient was placed back on oxygen via nasal cannula.  Patient was given 2 L of normal saline, states he is feeling better.  Flu swab is negative, CBC with mildly elevated white count at 13.2, lactic acid x2-,  BMP with slight AKI urinalysis positive for ketones and protein.  Case discussed with Dr. Charm BargesButler, ER attending, agrees with plan to admit patient and has seen patient.  Case discussed with hospitalist who requests blood cultures, procalcitonin level, respiratory panel, doxycycline.  Discussed with patient who agrees with plan.   [LM]    Clinical Course User Index [LM] Jeannie FendMurphy, Rechel Delosreyes A, PA-C [MB] Terrilee FilesButler, Michael C, MD   Final Clinical Impressions(s) / ED Diagnoses   Final diagnoses:  COPD exacerbation (HCC)  Fever, unspecified  Hypoxemia  AKI (acute kidney injury) [facility](HCC)    ED Discharge Orders    None       Alden HippMurphy, Smera Guyette A, PA-C 06/21/18 1907    Terrilee FilesButler, Michael C, MD 06/22/18 1320

## 2018-06-21 NOTE — ED Notes (Signed)
We had done the experiment in which we turn of his O2. He drops to 87%. O2 resumed at 2 l.p.m. pt. Remains in no distress. He seems very sleepy with some features of ?sleep apnea?.Marland Kitchen

## 2018-06-22 ENCOUNTER — Other Ambulatory Visit: Payer: Self-pay

## 2018-06-22 LAB — RESPIRATORY PANEL BY PCR
Adenovirus: NOT DETECTED
Bordetella pertussis: NOT DETECTED
CORONAVIRUS OC43-RVPPCR: NOT DETECTED
Chlamydophila pneumoniae: NOT DETECTED
Coronavirus 229E: NOT DETECTED
Coronavirus HKU1: NOT DETECTED
Coronavirus NL63: NOT DETECTED
Influenza A: NOT DETECTED
Influenza B: NOT DETECTED
Metapneumovirus: NOT DETECTED
Mycoplasma pneumoniae: NOT DETECTED
PARAINFLUENZA VIRUS 1-RVPPCR: NOT DETECTED
Parainfluenza Virus 2: NOT DETECTED
Parainfluenza Virus 3: NOT DETECTED
Parainfluenza Virus 4: NOT DETECTED
Respiratory Syncytial Virus: NOT DETECTED
Rhinovirus / Enterovirus: NOT DETECTED

## 2018-06-22 LAB — CBC
HCT: 45.4 % (ref 39.0–52.0)
Hemoglobin: 14.4 g/dL (ref 13.0–17.0)
MCH: 31.2 pg (ref 26.0–34.0)
MCHC: 31.7 g/dL (ref 30.0–36.0)
MCV: 98.3 fL (ref 80.0–100.0)
PLATELETS: 217 10*3/uL (ref 150–400)
RBC: 4.62 MIL/uL (ref 4.22–5.81)
RDW: 12.8 % (ref 11.5–15.5)
WBC: 17.5 10*3/uL — ABNORMAL HIGH (ref 4.0–10.5)
nRBC: 0 % (ref 0.0–0.2)

## 2018-06-22 LAB — COMPREHENSIVE METABOLIC PANEL
ALK PHOS: 79 U/L (ref 38–126)
ALT: 22 U/L (ref 0–44)
AST: 31 U/L (ref 15–41)
Albumin: 4.1 g/dL (ref 3.5–5.0)
Anion gap: 10 (ref 5–15)
BUN: 18 mg/dL (ref 6–20)
CALCIUM: 8.6 mg/dL — AB (ref 8.9–10.3)
CO2: 23 mmol/L (ref 22–32)
Chloride: 107 mmol/L (ref 98–111)
Creatinine, Ser: 0.84 mg/dL (ref 0.61–1.24)
GFR calc non Af Amer: >60 mL/min (ref >60)
Glucose, Bld: 117 mg/dL — ABNORMAL HIGH (ref 70–99)
Potassium: 4.6 mmol/L (ref 3.5–5.1)
Sodium: 140 mmol/L (ref 135–145)
TOTAL PROTEIN: 6.9 g/dL (ref 6.5–8.1)
Total Bilirubin: 0.8 mg/dL (ref 0.3–1.2)

## 2018-06-22 LAB — PROCALCITONIN: Procalcitonin: <0.1 ng/mL

## 2018-06-22 LAB — BRAIN NATRIURETIC PEPTIDE: B Natriuretic Peptide: 87.9 pg/mL (ref 0.0–100.0)

## 2018-06-22 LAB — GLUCOSE, CAPILLARY: Glucose-Capillary: 130 mg/dL — ABNORMAL HIGH (ref 70–99)

## 2018-06-22 LAB — MAGNESIUM: Magnesium: 2.4 mg/dL (ref 1.7–2.4)

## 2018-06-22 MED ORDER — PREDNISONE 20 MG PO TABS: 40.0000 mg | ORAL_TABLET | Freq: Every day | ORAL | 0 refills | Status: AC

## 2018-06-22 MED ORDER — IPRATROPIUM-ALBUTEROL 0.5-2.5 (3) MG/3ML IN SOLN: 3.0000 mL | RESPIRATORY_TRACT | 0 refills | Status: AC | PRN

## 2018-06-22 MED ORDER — BUDESONIDE 0.5 MG/2ML IN SUSP: 0.5000 mg | Freq: Two times a day (BID) | RESPIRATORY_TRACT | 0 refills | Status: DC

## 2018-06-22 MED ORDER — ALBUTEROL SULFATE HFA 108 (90 BASE) MCG/ACT IN AERS: 2.0000 | INHALATION_SPRAY | Freq: Four times a day (QID) | RESPIRATORY_TRACT | 1 refills | Status: DC | PRN

## 2018-06-22 NOTE — Care Management (Signed)
[facility] rep alerted of need for nebulizer. Sandford Crazeora Levaughn Puccinelli RN,BSN [phone]

## 2018-06-22 NOTE — Progress Notes (Signed)
SATURATION QUALIFICATIONS: (This note is used to comply with regulatory documentation for home oxygen)  Patient Saturations on Room Air at Rest = 98%  Patient Saturations on Room Air while Ambulating = 98%  Patient Saturations on 0 Liters of oxygen while Ambulating = 98%  Please briefly explain why patient needs home oxygen:  

## 2018-06-22 NOTE — Discharge Summary (Signed)
Physician Discharge Summary  Brandon Garza QMV:[id number] DOB: 09-08-1975 DOA: 06/21/2018  PCP: Marcine Matar, MD  Admit date: 06/21/2018 Discharge date: 06/22/2018  Admitted From: Home Disposition: Home  Recommendations for Outpatient Follow-up:  1. Follow up with PCP in 1-2 weeks 2. Please obtain BMP/CBC in one week your next doctors visit.  3. 5 days of oral prednisone 4. Nebulizer and albuterol prescribed.  Pulmicort prescribed 5. Recommend outpatient PFTs   Discharge Condition: Stable CODE STATUS: Full Diet recommendation: 2 g salt  Brief/Interim Summary: 42 y.o. male with medical history significant of essential hypertension, general anxiety disorder, GERD, history of tobacco use came to the hospital with shortness of breath.  Patient states for the past week he has felt progressive shortness of breath, diaphoresis and off-and-on fevers and chills.  At first he did not want to come to the hospital for evaluation but eventually was insisted by his family to have him evaluated. In the ER initially was noted to be febrile with temperature of 100.7, sinus tachycardia and diaphoresis.  His oxygen saturation was 86% on room air.  He was started on bronchodilators and steroids were given.  Chest x-ray was negative for any acute pathology.  With ambulation his oxygen saturation remained around 85%.  Due to this medical team was consulted for admission.  Overnight patient receivedBronchodilators, IV steroids and doxycycline.  Procalcitonin levels were negative.  BNP was within normal limits.  Doxycycline was stopped.  Leukocytosis secondary to steroid use.  Respiratory panel was negative, flu swab was negative. Today patient is doing well and with ambulation is saturating greater than 95%.  We will discharge him with recommendations as stated above.   Discharge Diagnoses:  Principal Problem:   Acute respiratory failure with hypoxia (HCC) Active Problems:   Essential hypertension    GAD (generalized anxiety disorder)   GERD (gastroesophageal reflux disease)   Former smoker  Acute hypoxic Resp Distress, resolved.  98% on room air with ambulation Acute exacerbation of Mild Intermittent COPD SIRS/Fever, unknown source -- We will prescribe him nebulizers and inhaler upon discharge.  Transition from IV Solu-Medrol to 5 days of oral prednisone -Procalcitonin negative, discontinue doxycycline -Prescribed Pulmicort to be taken at home -Flu swab and respiratory panel negative -Ambulatory pulse ox is greater than 98%.  Essential hypertension -Continue Norvasc 10 mg daily, metoprolol 100 mg orally daily  History of general anxiety disorder -Continue Xanax 3 times daily, Wellbutrin  Peripheral neuropathy -Gabapentin 300 mg 3 times daily  Gout -Allopurinol daily  Discharge today in stable condition Patient was on Lovenox during the hospitalization He is full code   Discharge Instructions   Allergies as of 06/22/2018      Reactions   Amoxicillin Hives   Has patient had a PCN reaction causing immediate rash, facial/tongue/throat swelling, SOB or lightheadedness with hypotension: no Has patient had a PCN reaction causing severe rash involving mucus membranes or skin necrosis: no Has patient had a PCN reaction that required hospitalization: on Has patient had a PCN reaction occurring within the last 10 years: no If all of the above answers are "NO", then may proceed with Cephalosporin use.      Medication List    TAKE these medications   acetaminophen 500 MG tablet Commonly known as:  TYLENOL Take 2 tablets (1,000 mg total) by mouth every 6 (six) hours as needed.   albuterol 108 (90 Base) MCG/ACT inhaler Commonly known as:  PROVENTIL HFA;VENTOLIN HFA Inhale 2 puffs into the lungs every 6 (  six) hours as needed for wheezing or shortness of breath.   allopurinol 100 MG tablet Commonly known as:  ZYLOPRIM Take 1 tablet (100 mg total) by mouth daily.    ALPRAZolam 0.5 MG tablet Commonly known as:  XANAX 1 tablet 3 times daily for 1 week 1 tablet 2 times daily for 1 week 1 tablet 1 time daily for 1 week 1/2 tablet 1 time daily for 1 week What changed:    how much to take  how to take this  when to take this  additional instructions   amLODipine 10 MG tablet Commonly known as:  NORVASC Take 1 tablet (10 mg total) by mouth daily.   benzonatate 100 MG capsule Commonly known as:  TESSALON Take 2 capsules (200 mg total) by mouth every 8 (eight) hours.   budesonide 0.5 MG/2ML nebulizer solution Commonly known as:  PULMICORT Take 2 mLs (0.5 mg total) by nebulization 2 (two) times daily.   buPROPion 150 MG 24 hr tablet Commonly known as:  WELLBUTRIN XL Take 1 tablet (150 mg total) by mouth daily.   colchicine 0.6 MG tablet 1.2 mg at the first sign of flare, followed in 1 hour with a single dose of 0.6 mg (maximum: 1.8 mg within 1 hour).  Repeat as needed.   famotidine 20 MG tablet Commonly known as:  PEPCID Take 1 tablet (20 mg total) by mouth 2 (two) times daily.   fluticasone 110 MCG/ACT inhaler Commonly known as:  FLOVENT HFA Inhale 2 puffs into the lungs 2 (two) times daily.   fluticasone 50 MCG/ACT nasal spray Commonly known as:  FLONASE Place 1 spray into both nostrils daily. What changed:    when to take this  reasons to take this   gabapentin 300 MG capsule Commonly known as:  NEURONTIN Take 300 mg by mouth 3 (three) times daily.   ibuprofen 800 MG tablet Commonly known as:  ADVIL,MOTRIN Take 1 tablet (800 mg total) by mouth every 6 (six) hours as needed.   ipratropium-albuterol 0.5-2.5 (3) MG/3ML Soln Commonly known as:  DUONEB Take 3 mLs by nebulization every 4 (four) hours as needed.   metoprolol succinate 100 MG 24 hr tablet Commonly known as:  TOPROL-XL TAKE 1 TABLET (100 MG TOTAL) BY MOUTH DAILY. TAKE WITH OR IMMEDIATELY FOLLOWING A MEAL. What changed:    how much to take  how to take  this  when to take this  additional instructions   omeprazole 20 MG capsule Commonly known as:  PRILOSEC Take 1 capsule (20 mg total) by mouth daily for 14 days.   predniSONE 20 MG tablet Commonly known as:  DELTASONE Take 2 tablets (40 mg total) by mouth daily with breakfast for 5 days.   traMADol 50 MG tablet Commonly known as:  ULTRAM Take 1 tablet (50 mg total) by mouth every 6 (six) hours as needed for severe pain.      Follow-up Information    Marcine Matar, MD. Schedule an appointment as soon as possible for a visit in 1 week(s).   Specialty:  Internal Medicine Contact information: [address] [place] [address] [phone]          Allergies  Allergen Reactions  . Amoxicillin Hives    Has patient had a PCN reaction causing immediate rash, facial/tongue/throat swelling, SOB or lightheadedness with hypotension: no Has patient had a PCN reaction causing severe rash involving mucus membranes or skin necrosis: no Has patient had a PCN reaction that required  hospitalization: on Has patient had a PCN reaction occurring within the last 10 years: no If all of the above answers are "NO", then may proceed with Cephalosporin use.     You were cared for by a hospitalist during your hospital stay. If you have any questions about your discharge medications or the care you received while you were in the hospital after you are discharged, you can call the unit and asked to speak with the hospitalist on call if the hospitalist that took care of you is not available. Once you are discharged, your primary care physician will handle any further medical issues. Please note that no refills for any discharge medications will be authorized once you are discharged, as it is imperative that you return to your primary care physician (or establish a relationship with a primary care physician if you do not have one) for your aftercare needs so that they can reassess your need for  medications and monitor your lab values.  Consultations:  None   Procedures/Studies: Dg Chest 2 View  Result Date: 06/21/2018 CLINICAL DATA:  Fever, nonproductive cough, and diaphoresis for 4 days. EXAM: CHEST - 2 VIEW COMPARISON:  09/10/2017 FINDINGS: The heart size and mediastinal contours are within normal limits. Pleural-parenchymal scarring is noted at the right lung base. No evidence of pulmonary infiltrate or edema. Heart size and mediastinal contours are normal. IMPRESSION: No active cardiopulmonary disease. Electronically Signed   By: Myles RosenthalJohn  Stahl M.D.   On: 06/21/2018 16:18      Subjective: No complaints, feels better.  General = no fevers, chills, dizziness, malaise, fatigue HEENT/EYES = negative for pain, redness, loss of vision, double vision, blurred vision, loss of hearing, sore throat, hoarseness, dysphagia Cardiovascular= negative for chest pain, palpitation, murmurs, lower extremity swelling Respiratory/lungs= negative for shortness of breath, cough, hemoptysis, wheezing, mucus production Gastrointestinal= negative for nausea, vomiting,, abdominal pain, melena, hematemesis Genitourinary= negative for Dysuria, Hematuria, Change in Urinary Frequency MSK = Negative for arthralgia, myalgias, Back Pain, Joint swelling  Neurology= Negative for headache, seizures, numbness, tingling  Psychiatry= Negative for anxiety, depression, suicidal and homocidal ideation Allergy/Immunology= Medication/Food allergy as listed  Skin= Negative for Rash, lesions, ulcers, itching   Discharge Exam: Vitals:   06/22/18 0507 06/22/18 0805  BP: 129/83   Pulse: (!) 53 76  Resp: 16   Temp: 97.6 F (36.4 C)   SpO2: 98% 98%   Vitals:   06/21/18 2210 06/22/18 0500 06/22/18 0507 06/22/18 0805  BP:   129/83   Pulse:   (!) 53 76  Resp:   16   Temp:   97.6 F (36.4 C)   TempSrc:      SpO2: 99%  98% 98%  Weight:  108.6 kg    Height:        General: Pt is alert, awake, not in acute  distress Cardiovascular: RRR, S1/S2 +, no rubs, no gallops Respiratory: Mild coarse breath sounds but greatly improved Abdominal: Soft, NT, ND, bowel sounds + Extremities: no edema, no cyanosis    The results of significant diagnostics from this hospitalization (including imaging, microbiology, ancillary and laboratory) are listed below for reference.     Microbiology: Recent Results (from the past 240 hour(s))  Respiratory Panel by PCR     Status: None   Collection Time: 06/21/18  6:57 PM  Result Value Ref Range Status   Adenovirus NOT DETECTED NOT DETECTED Final   Coronavirus 229E NOT DETECTED NOT DETECTED Final   Coronavirus HKU1 NOT DETECTED NOT  DETECTED Final   Coronavirus NL63 NOT DETECTED NOT DETECTED Final   Coronavirus OC43 NOT DETECTED NOT DETECTED Final   Metapneumovirus NOT DETECTED NOT DETECTED Final   Rhinovirus / Enterovirus NOT DETECTED NOT DETECTED Final   Influenza A NOT DETECTED NOT DETECTED Final   Influenza B NOT DETECTED NOT DETECTED Final   Parainfluenza Virus 1 NOT DETECTED NOT DETECTED Final   Parainfluenza Virus 2 NOT DETECTED NOT DETECTED Final   Parainfluenza Virus 3 NOT DETECTED NOT DETECTED Final   Parainfluenza Virus 4 NOT DETECTED NOT DETECTED Final   Respiratory Syncytial Virus NOT DETECTED NOT DETECTED Final   Bordetella pertussis NOT DETECTED NOT DETECTED Final   Chlamydophila pneumoniae NOT DETECTED NOT DETECTED Final   Mycoplasma pneumoniae NOT DETECTED NOT DETECTED Final    Comment: Performed at [facility], [address] [place], [place] [address]  Blood culture (routine x 2)     Status: None (Preliminary result)   Collection Time: 06/21/18  8:23 PM  Result Value Ref Range Status   Specimen Description BLOOD RIGHT ARM  Final   Special Requests   Final    BOTTLES DRAWN AEROBIC AND ANAEROBIC Blood Culture adequate volume Performed at [facility], [address] [place], [place] [address]    Culture NO GROWTH  < 12 HOURS  Final   Report Status PENDING  Incomplete  Blood culture (routine x 2)     Status: None (Preliminary result)   Collection Time: 06/21/18  8:23 PM  Result Value Ref Range Status   Specimen Description BLOOD RIGHT HAND  Final   Special Requests   Final    BOTTLES DRAWN AEROBIC AND ANAEROBIC Blood Culture adequate volume Performed at [facility], [address] [place], [place] [address]    Culture NO GROWTH < 12 HOURS  Final   Report Status PENDING  Incomplete     Labs: BNP (last 3 results) Recent Labs    06/22/18 0651  BNP 87.9   Basic Metabolic Panel: Recent Labs  Lab 06/21/18 1546 06/21/18 2023 06/22/18 0651  NA 141  --  140  K 4.6  --  4.6  CL 107  --  107  CO2 24  --  23  GLUCOSE 107*  --  117*  BUN 26*  --  18  CREATININE 1.31* 1.03 0.84  CALCIUM 9.0  --  8.6*  MG  --   --  2.4   Liver Function Tests: Recent Labs  Lab 06/22/18 0651  AST 31  ALT 22  ALKPHOS 79  BILITOT 0.8  PROT 6.9  ALBUMIN 4.1   No results for input(s): LIPASE, AMYLASE in the last 168 hours. No results for input(s): AMMONIA in the last 168 hours. CBC: Recent Labs  Lab 06/21/18 1546 06/21/18 2023 06/22/18 0651  WBC 13.2* 18.2* 17.5*  NEUTROABS 10.9*  --   --   HGB 16.2 14.4 14.4  HCT 49.9 45.0 45.4  MCV 98.4 101.4* 98.3  PLT 264 200 217   Cardiac Enzymes: No results for input(s): CKTOTAL, CKMB, CKMBINDEX, TROPONINI in the last 168 hours. BNP: Invalid input(s): POCBNP CBG: Recent Labs  Lab 06/21/18 2146 06/22/18 0748  GLUCAP 100* 130*   D-Dimer No results for input(s): DDIMER in the last 72 hours. Hgb A1c No results for input(s): HGBA1C in the last 72 hours. Lipid Profile No results for input(s): CHOL, HDL, LDLCALC, TRIG, CHOLHDL, LDLDIRECT in the last 72 hours. Thyroid function studies No results  for input(s): TSH, T4TOTAL, T3FREE, THYROIDAB in the last 72 hours.  Invalid input(s): FREET3 Anemia work up No results for input(s):  VITAMINB12, FOLATE, FERRITIN, TIBC, IRON, RETICCTPCT in the last 72 hours. Urinalysis    Component Value Date/Time   COLORURINE YELLOW 06/21/2018 1745   APPEARANCEUR CLEAR 06/21/2018 1745   LABSPEC 1.025 06/21/2018 1745   PHURINE 5.0 06/21/2018 1745   GLUCOSEU NEGATIVE 06/21/2018 1745   HGBUR NEGATIVE 06/21/2018 1745   BILIRUBINUR NEGATIVE 06/21/2018 1745   KETONESUR 5 (A) 06/21/2018 1745   PROTEINUR 30 (A) 06/21/2018 1745   UROBILINOGEN 0.2 12/16/2010 0942   NITRITE NEGATIVE 06/21/2018 1745   LEUKOCYTESUR NEGATIVE 06/21/2018 1745   Sepsis Labs Invalid input(s): PROCALCITONIN,  WBC,  LACTICIDVEN Microbiology Recent Results (from the past 240 hour(s))  Respiratory Panel by PCR     Status: None   Collection Time: 06/21/18  6:57 PM  Result Value Ref Range Status   Adenovirus NOT DETECTED NOT DETECTED Final   Coronavirus 229E NOT DETECTED NOT DETECTED Final   Coronavirus HKU1 NOT DETECTED NOT DETECTED Final   Coronavirus NL63 NOT DETECTED NOT DETECTED Final   Coronavirus OC43 NOT DETECTED NOT DETECTED Final   Metapneumovirus NOT DETECTED NOT DETECTED Final   Rhinovirus / Enterovirus NOT DETECTED NOT DETECTED Final   Influenza A NOT DETECTED NOT DETECTED Final   Influenza B NOT DETECTED NOT DETECTED Final   Parainfluenza Virus 1 NOT DETECTED NOT DETECTED Final   Parainfluenza Virus 2 NOT DETECTED NOT DETECTED Final   Parainfluenza Virus 3 NOT DETECTED NOT DETECTED Final   Parainfluenza Virus 4 NOT DETECTED NOT DETECTED Final   Respiratory Syncytial Virus NOT DETECTED NOT DETECTED Final   Bordetella pertussis NOT DETECTED NOT DETECTED Final   Chlamydophila pneumoniae NOT DETECTED NOT DETECTED Final   Mycoplasma pneumoniae NOT DETECTED NOT DETECTED Final    Comment: Performed at [facility], [address], [place], [place] [address]  Blood culture (routine x 2)     Status: None (Preliminary result)   Collection Time: 06/21/18  8:23 PM  Result Value Ref Range Status    Specimen Description BLOOD RIGHT ARM  Final   Special Requests   Final    BOTTLES DRAWN AEROBIC AND ANAEROBIC Blood Culture adequate volume Performed at [facility], [address], [place], [place] [address]    Culture NO GROWTH < 12 HOURS  Final   Report Status PENDING  Incomplete  Blood culture (routine x 2)     Status: None (Preliminary result)   Collection Time: 06/21/18  8:23 PM  Result Value Ref Range Status   Specimen Description BLOOD RIGHT HAND  Final   Special Requests   Final    BOTTLES DRAWN AEROBIC AND ANAEROBIC Blood Culture adequate volume Performed at [facility], [address], [place], [place] [address]    Culture NO GROWTH < 12 HOURS  Final   Report Status PENDING  Incomplete     Time coordinating discharge:  I have spent 35 minutes face to face with the patient and on the ward discussing the patients care, assessment, plan and disposition with other care givers. >50% of the time was devoted counseling the patient about the risks and benefits of treatment/Discharge disposition and coordinating care.   SIGNED:   Dimple Nanas, MD  [facility] 06/22/2018, 9:11 AM Pager   If 7PM-7AM, please contact night-coverage [email or web address] Password [email or web address]

## 2018-06-23 LAB — HIV ANTIBODY (ROUTINE TESTING W REFLEX): HIV Screen 4th Generation wRfx: NONREACTIVE

## 2018-06-26 LAB — CULTURE, BLOOD (ROUTINE X 2)
Culture: NO GROWTH
Culture: NO GROWTH
SPECIAL REQUESTS: ADEQUATE
Special Requests: ADEQUATE

## 2018-07-18 ENCOUNTER — Ambulatory Visit: Payer: Self-pay | Admitting: Emergency Medicine

## 2018-08-11 MED FILL — BUPROPION HCL XL 150 MG TAB: 150 | 30 days supply | Qty: 30 | Fill #1

## 2018-08-11 MED FILL — !FLOVENT HFA 110 MCG INHALE: 110 | 30 days supply | Qty: 12 | Fill #1

## 2018-08-11 MED FILL — AMLODIPINE BESYLATE 10 MG T: 10 | 30 days supply | Qty: 30 | Fill #0

## 2018-08-11 MED FILL — !VENTOLIN HFA INHALER: 108 (90 BAS | 25 days supply | Qty: 18 | Fill #1

## 2018-08-11 MED FILL — ALLOPURINOL 100 MG TABLET: 100 | 30 days supply | Qty: 30 | Fill #1

## 2018-08-11 MED FILL — !COLCRYS 0.6 MG TABLET: 0.6 MG | 5 days supply | Qty: 12 | Fill #1

## 2018-08-11 MED FILL — METOPROLOL SUCCINATE ER 100: 100 | 30 days supply | Qty: 30 | Fill #1

## 2018-09-14 ENCOUNTER — Ambulatory Visit: Payer: Self-pay | Admitting: Family Medicine

## 2018-09-14 ENCOUNTER — Encounter: Payer: Self-pay | Admitting: Family Medicine

## 2018-09-14 ENCOUNTER — Other Ambulatory Visit: Payer: Self-pay

## 2018-09-14 VITALS — BP 122/82 | HR 70 | Temp 98.4°F | Ht 70.0 in | Wt 258.0 lb

## 2018-09-14 DIAGNOSIS — J4521 Mild intermittent asthma with (acute) exacerbation: Secondary | ICD-10-CM

## 2018-09-14 DIAGNOSIS — I10 Essential (primary) hypertension: Secondary | ICD-10-CM

## 2018-09-14 DIAGNOSIS — F411 Generalized anxiety disorder: Secondary | ICD-10-CM

## 2018-09-14 DIAGNOSIS — R0683 Snoring: Secondary | ICD-10-CM

## 2018-09-14 DIAGNOSIS — F3341 Major depressive disorder, recurrent, in partial remission: Secondary | ICD-10-CM

## 2018-09-14 DIAGNOSIS — M1000 Idiopathic gout, unspecified site: Secondary | ICD-10-CM

## 2018-09-14 DIAGNOSIS — K219 Gastro-esophageal reflux disease without esophagitis: Secondary | ICD-10-CM

## 2018-09-14 MED ORDER — ESCITALOPRAM OXALATE 20 MG PO TABS: 20.0000 mg | ORAL_TABLET | Freq: Every day | ORAL | 3 refills | Status: AC

## 2018-09-14 MED ORDER — ALLOPURINOL 300 MG PO TABS: 300.0000 mg | ORAL_TABLET | Freq: Every day | ORAL | 1 refills | Status: AC

## 2018-09-14 MED ORDER — BUDESONIDE 0.5 MG/2ML IN SUSP: 0.5000 mg | Freq: Two times a day (BID) | RESPIRATORY_TRACT | 5 refills | Status: DC

## 2018-09-14 MED ORDER — ALBUTEROL SULFATE HFA 108 (90 BASE) MCG/ACT IN AERS: 2.0000 | INHALATION_SPRAY | Freq: Four times a day (QID) | RESPIRATORY_TRACT | 5 refills | Status: AC | PRN

## 2018-09-14 MED ORDER — OMEPRAZOLE 20 MG PO CPDR: 20.0000 mg | DELAYED_RELEASE_CAPSULE | Freq: Every day | ORAL | 3 refills | Status: DC | PRN

## 2018-09-14 MED ORDER — FLUTICASONE PROPIONATE 50 MCG/ACT NA SUSP: 1.0000 | Freq: Every day | NASAL | 5 refills | Status: AC | PRN

## 2018-09-14 MED ORDER — METOPROLOL SUCCINATE ER 100 MG PO TB24: ORAL_TABLET | ORAL | 3 refills | Status: DC

## 2018-09-14 MED ORDER — BUDESONIDE 0.5 MG/2ML IN SUSP: 0.5000 mg | Freq: Two times a day (BID) | RESPIRATORY_TRACT | 5 refills | Status: AC

## 2018-09-14 MED ORDER — AMLODIPINE BESYLATE 10 MG PO TABS: 10.0000 mg | ORAL_TABLET | Freq: Every day | ORAL | 3 refills | Status: AC

## 2018-09-14 MED ORDER — AMLODIPINE BESYLATE 10 MG PO TABS: 10.0000 mg | ORAL_TABLET | Freq: Every day | ORAL | 3 refills | Status: DC

## 2018-09-14 MED ORDER — INDOMETHACIN 25 MG PO CAPS: 25.0000 mg | ORAL_CAPSULE | Freq: Two times a day (BID) | ORAL | 2 refills | Status: DC

## 2018-09-14 MED ORDER — INDOMETHACIN 25 MG PO CAPS: 25.0000 mg | ORAL_CAPSULE | Freq: Two times a day (BID) | ORAL | 2 refills | Status: AC

## 2018-09-14 MED ORDER — METOPROLOL SUCCINATE ER 100 MG PO TB24: ORAL_TABLET | ORAL | 3 refills | Status: AC

## 2018-09-14 MED ORDER — ALBUTEROL SULFATE HFA 108 (90 BASE) MCG/ACT IN AERS: 2.0000 | INHALATION_SPRAY | Freq: Four times a day (QID) | RESPIRATORY_TRACT | 5 refills | Status: DC | PRN

## 2018-09-14 MED ORDER — BUPROPION HCL ER (XL) 150 MG PO TB24: 150.0000 mg | ORAL_TABLET | Freq: Every day | ORAL | 11 refills | Status: AC

## 2018-09-14 MED ORDER — ESCITALOPRAM OXALATE 20 MG PO TABS: 20.0000 mg | ORAL_TABLET | Freq: Every day | ORAL | 3 refills | Status: DC

## 2018-09-14 MED ORDER — OMEPRAZOLE 20 MG PO CPDR: 20.0000 mg | DELAYED_RELEASE_CAPSULE | Freq: Every day | ORAL | 3 refills | Status: AC | PRN

## 2018-09-14 MED ORDER — ALLOPURINOL 300 MG PO TABS: 300.0000 mg | ORAL_TABLET | Freq: Every day | ORAL | 1 refills | Status: DC

## 2018-09-14 MED ORDER — BUPROPION HCL ER (XL) 150 MG PO TB24: 150.0000 mg | ORAL_TABLET | Freq: Every day | ORAL | 11 refills | Status: DC

## 2018-09-14 MED ORDER — FLUTICASONE PROPIONATE 50 MCG/ACT NA SUSP: 1.0000 | Freq: Every day | NASAL | 5 refills | Status: DC | PRN

## 2018-09-14 NOTE — Progress Notes (Signed)
3/18/20201:52 PM  Brandon Garza 05-08-1976, 43 y.o. male [id number]  Chief Complaint  Patient presents with  . Establish Care  . Anxiety  . Depression    HPI:   Patient is a 43 y.o. male with past medical history significant for HTN, depression, anxiety, gout, gerd, asthma, former smoker, stutters, pediatric ophtho herpes s/p corneal transplant who presents today to establish care  Last OV in aug 2019 Last asthma attack towards end of January Quit smoking about 5 years ago, 1ppd x 8 years Last gout attack around December, attacks feet, elbows and wrist, prefers indomethacin for flareups, gets flareups about 4-6 times a year Takes wellbutrin for depression  Has tried paxil - "really messed with me" gerd doing ok on omeprazole, rarely needs tums or rolaids  Wondering if he has sleep apnea, as he has been waking up feeling restless, gasping for air Will take alprazolam or alcohol - helps Unemployed, lives with sister and his niece  GAD 7 : Generalized Anxiety Score 09/14/2018 02/17/2018 09/28/2017 09/15/2017  Nervous, Anxious, on Edge 3 3 0 1  Control/stop worrying 2 1 0 0  Worry too much - different things 2 1 0 0  Trouble relaxing 2 0 0 0  Restless 1 1 0 0  Easily annoyed or irritable 1 0 0 0  Afraid - awful might happen 3 1 0 0  Total GAD 7 Score 14 7 0 1  Anxiety Difficulty Somewhat difficult - - -     Fall Risk  09/14/2018 09/14/2018 02/17/2018 09/28/2017 12/25/2016  Falls in the past year? 1 0 No No No  Comment fell last week on thre porch, loose board - - - -  Number falls in past yr: - 0 - - -  Injury with Fall? - 0 - - -     Depression screen [facility] 2/9 09/14/2018 02/17/2018 09/28/2017  Decreased Interest 2 0 0  Down, Depressed, Hopeless 2 1 0  PHQ - 2 Score 4 1 0  Altered sleeping 2 2 0  Tired, decreased energy Change in appetite Feeling bad or failure about yourself  1 1 0  Trouble concentrating - 0 0  Moving slowly or fidgety/restless 2 0 0  Suicidal  thoughts 0 0 0  PHQ-9 Score Difficult doing work/chores Somewhat difficult - -  Some recent data might be hidden    Allergies  Allergen Reactions  . Amoxicillin Hives    Has patient had a PCN reaction causing immediate rash, facial/tongue/throat swelling, SOB or lightheadedness with hypotension: no Has patient had a PCN reaction causing severe rash involving mucus membranes or skin necrosis: no Has patient had a PCN reaction that required hospitalization: on Has patient had a PCN reaction occurring within the last 10 years: no If all of the above answers are "NO", then may proceed with Cephalosporin use.     Prior to Admission medications   Medication Sig Start Date End Date Taking? Authorizing Provider  albuterol (PROVENTIL HFA;VENTOLIN HFA) 108 (90 Base) MCG/ACT inhaler Inhale 2 puffs into the lungs every 6 (six) hours as needed for wheezing or shortness of breath. 06/22/18 07/22/18  Amin, Loura Halt, MD  allopurinol (ZYLOPRIM) 100 MG tablet Take 1 tablet (100 mg total) by mouth daily. 08/25/17   Storm Frisk, MD  ALPRAZolam Prudy Feeler) 0.5 MG tablet 1 tablet 3 times daily for 1 week 1 tablet 2 times daily for 1 week 1 tablet  1 time daily for 1 week 1/2 tablet 1 time daily for 1 week Patient taking differently: Take 0.5 mg by mouth 3 (three) times daily.  02/17/18   Anders Simmonds, PA-C  amLODipine (NORVASC) 10 MG tablet Take 1 tablet (10 mg total) by mouth daily. 09/28/17   Marcine Matar, MD  benzonatate (TESSALON) 100 MG capsule Take 2 capsules (200 mg total) by mouth every 8 (eight) hours. Patient not taking: Reported on 06/21/2018 05/02/18   Dietrich Pates, PA-C  budesonide (PULMICORT) 0.5 MG/2ML nebulizer solution Take 2 mLs (0.5 mg total) by nebulization 2 (two) times daily. 06/22/18 07/22/18  Amin, Loura Halt, MD  buPROPion (WELLBUTRIN XL) 150 MG 24 hr tablet Take 1 tablet (150 mg total) by mouth daily. 08/25/17   Storm Frisk, MD  colchicine 0.6 MG tablet 1.2  mg at the first sign of flare, followed in 1 hour with a single dose of 0.6 mg (maximum: 1.8 mg within 1 hour).  Repeat as needed. Patient not taking: Reported on 06/21/2018 08/25/17   Storm Frisk, MD  famotidine (PEPCID) 20 MG tablet Take 1 tablet (20 mg total) by mouth 2 (two) times daily. Patient not taking: Reported on 06/21/2018 08/25/17   Storm Frisk, MD  fluticasone [facility]) 50 MCG/ACT nasal spray Place 1 spray into both nostrils daily. Patient taking differently: Place 1 spray into both nostrils daily as needed for allergies.  05/02/18   Khatri, Hina, PA-C  ibuprofen (ADVIL,MOTRIN) 800 MG tablet Take 1 tablet (800 mg total) by mouth every 6 (six) hours as needed. 05/09/18   Lawyer, Cristal Deer, PA-C  ipratropium-albuterol (DUONEB) 0.5-2.5 (3) MG/3ML SOLN Take 3 mLs by nebulization every 4 (four) hours as needed. 06/22/18 07/22/18  Amin, Loura Halt, MD  metoprolol succinate (TOPROL-XL) 100 MG 24 hr tablet TAKE 1 TABLET (100 MG TOTAL) BY MOUTH DAILY. TAKE WITH OR IMMEDIATELY FOLLOWING A MEAL. Patient taking differently: Take 100 mg by mouth daily.  08/25/17   Storm Frisk, MD  omeprazole (PRILOSEC) 20 MG capsule Take 1 capsule (20 mg total) by mouth daily for 14 days. Patient not taking: Reported on 06/21/2018 12/17/17 12/31/17  Delia Chimes    Past Medical History:  Diagnosis Date  . Acid reflux   . Allergy   . Anxiety   . Arthritis   . Asthma   . Bronchitis   . Depression with anxiety   . H/O degenerative disc disease   . 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 Hypertension   . Neuromuscular disorder [facility])     Past Surgical History:  Procedure Laterality Date  . CORNEAL TRANSPLANT     left eye  . DOBUTAMINE STRESS ECHO  10/12/2008   normal  . left hip surgery    . NASAL SEPTUM SURGERY    . orthopedic left arm surgery     done following an accident  . TONSILLECTOMY      Social History   Tobacco Use  . Smoking status: Former Smoker     Packs/day: 1.00    Years: 8.00    Pack years: 8.00    Types: Cigarettes    Last attempt to quit: 07/22/2011    Years since quitting: 7.1  . Smokeless tobacco: Former User    Types: Snuff, Dorna Bloom    Quit date: 07/22/2011  Substance Use Topics  . Alcohol use: Not Currently    Alcohol/week: 6.0 - 12.0 standard drinks    Types:  6 - 12 Standard drinks or equivalent per week    Comment: social    Family History  Problem Relation Age of Onset  . Heart Problems Father   . Stroke Maternal Grandfather   . Heart Problems Maternal Grandfather   . Mental illness Paternal Grandmother   . Heart Problems Paternal Grandfather     Review of Systems  Constitutional: Negative for chills and fever.  Respiratory: Negative for cough and shortness of breath.   Cardiovascular: Negative for chest pain, palpitations and leg swelling.  Gastrointestinal: Negative for abdominal pain, nausea and vomiting.  per hpi   OBJECTIVE:  Blood pressure 122/82, pulse 70, temperature 98.4 F (36.9 C), temperature source Oral, height  (1.778 m), weight 258 lb (117 kg), SpO2 96 %. Body mass index is 37.02 kg/m.   Physical Exam Vitals signs and nursing note reviewed.  Constitutional:      Appearance: He is well-developed.  HENT:     Head: Normocephalic and atraumatic.  Eyes:     Conjunctiva/sclera: Conjunctivae normal.     Pupils: Pupils are equal, round, and reactive to light.  Neck:     Musculoskeletal: Neck supple.  Cardiovascular:     Rate and Rhythm: Normal rate and regular rhythm.     Heart sounds: No murmur. No friction rub. No gallop.   Pulmonary:     Effort: Pulmonary effort is normal.     Breath sounds: Normal breath sounds. No wheezing or rales.  Skin:    General: Skin is warm and dry.  Neurological:     Mental Status: He is alert and oriented to person, place, and time.     ASSESSMENT and PLAN  1. GAD (generalized anxiety disorder) Not well controlled, starting lexapro reviewed  r/se/b and titration Did not renew alprazolam, patient with h/o addiction. There are other medications to be tried first.   2. Idiopathic gout, unspecified chronicity, unspecified site Having recurrent flareups, increasing allopurinol to  daily. Indomethacin for flareups. Reviewed r/se/b. - allopurinol (ZYLOPRIM) 300 MG tablet; Take 1 tablet (300 mg total) by mouth daily.  3. Essential hypertension Controlled. Continue current regime.  - amLODipine (NORVASC) 10 MG tablet; Take 1 tablet (10 mg total) by mouth daily. - Comprehensive metabolic panel  4. Snoring - Ambulatory referral to Sleep Studies  5. Mild intermittent asthma with acute exacerbation Controlled. Continue current regime.   6. Gastroesophageal reflux disease without esophagitis Controlled. Continue current regime.   7. Recurrent major depressive disorder, in partial remission (HCC) Controlled. Continue current regime.   Other orders - albuterol (PROVENTIL HFA;VENTOLIN HFA) 108 (90 Base) MCG/ACT inhaler; Inhale 2 puffs into the lungs every 6 (six) hours as needed for up to 30 days for wheezing or shortness of breath. - budesonide (PULMICORT) 0.5 MG/2ML nebulizer solution; Take 2 mLs (0.5 mg total) by nebulization 2 (two) times daily for 30 days. - buPROPion (WELLBUTRIN XL) 150 MG 24 hr tablet; Take 1 tablet (150 mg total) by mouth daily. - escitalopram (LEXAPRO) 20 MG tablet; Take 1 tablet (20 mg total) by mouth daily. - fluticasone (FLONASE) 50 MCG/ACT nasal spray; Place 1 spray into both nostrils daily as needed for allergies. - indomethacin (INDOCIN) 25 MG capsule; Take 1 capsule (25 mg total) by mouth 2 (two) times daily with a meal. - metoprolol succinate (TOPROL-XL) 100 MG 24 hr tablet; TAKE 1 TABLET (100 MG TOTAL) BY MOUTH DAILY. TAKE WITH OR IMMEDIATELY FOLLOWING A MEAL. - omeprazole (PRILOSEC) 20 MG capsule; Take 1 capsule (20  mg total) by mouth daily as needed.  Return in about 3 months (around 12/15/2018).     Myles Lipps, MD Primary Care at [facility] [address] [place], [place] [address] Ph.  [phone] Fax [phone]

## 2018-09-14 NOTE — Patient Instructions (Addendum)
Start lexapro at 1/2 tab (10mg ) daily for 2 weeks, then increase to 1 tab (20mg  ) daily if needed     If you have lab work done today you will be contacted with your lab results within the next 2 weeks.  If you have not heard from [place] then please contact us. The fastest way to get your results is to register for My Chart.   IF you received an x-ray today, you will receive an invoice from [facility]. Please contact [facility] at [phone] with questions or concerns regarding your invoice.   IF you received labwork today, you will receive an invoice from [facility]. Please contact [facility] at [phone] with questions or concerns regarding your invoice.   Our billing staff will not be able to assist you with questions regarding bills from these companies.  You will be contacted with the lab results as soon as they are available. The fastest way to get your results is to activate your My Chart account. Instructions are located on the last page of this paperwork. If you have not heard from [place] regarding the results in 2 weeks, please contact this office.

## 2018-09-15 LAB — COMPREHENSIVE METABOLIC PANEL
ALT: 24 IU/L (ref 0–44)
AST: 24 IU/L (ref 0–40)
Albumin/Globulin Ratio: 2.3 — ABNORMAL HIGH (ref 1.2–2.2)
Albumin: 4.5 g/dL (ref 4.0–5.0)
Alkaline Phosphatase: 95 IU/L (ref 39–117)
BUN/Creatinine Ratio: 20 (ref 9–20)
BUN: 16 mg/dL (ref 6–24)
Bilirubin Total: 0.2 mg/dL (ref 0.0–1.2)
CO2: 22 mmol/L (ref 20–29)
Calcium: 9.9 mg/dL (ref 8.7–10.2)
Chloride: 103 mmol/L (ref 96–106)
Creatinine, Ser: 0.79 mg/dL (ref 0.76–1.27)
GFR calc Af Amer: 128 mL/min/{1.73_m2} (ref >59)
GFR calc non Af Amer: 111 mL/min/{1.73_m2} (ref >59)
Globulin, Total: 2 g/dL (ref 1.5–4.5)
Glucose: 77 mg/dL (ref 65–99)
Potassium: 4.5 mmol/L (ref 3.5–5.2)
Sodium: 141 mmol/L (ref 134–144)
Total Protein: 6.5 g/dL (ref 6.0–8.5)

## 2018-10-28 DEATH — deceased

## 2018-11-30 ENCOUNTER — Institutional Professional Consult (permissible substitution): Payer: Self-pay | Admitting: Neurology

## 2018-12-07 ENCOUNTER — Ambulatory Visit: Payer: Self-pay | Admitting: Family Medicine
